# Patient Record
Sex: Female | Born: 1950
Health system: Southern US, Community
[De-identification: ages and names within clinical notes are randomized; demographics above are authoritative.]

## PROBLEM LIST (undated history)

## (undated) DIAGNOSIS — M810 Age-related osteoporosis without current pathological fracture: Secondary | ICD-10-CM

## (undated) DIAGNOSIS — IMO0002 Reserved for concepts with insufficient information to code with codable children: Secondary | ICD-10-CM

## (undated) DIAGNOSIS — I10 Essential (primary) hypertension: Secondary | ICD-10-CM

## (undated) DIAGNOSIS — M1990 Unspecified osteoarthritis, unspecified site: Secondary | ICD-10-CM

## (undated) DIAGNOSIS — I890 Lymphedema, not elsewhere classified: Secondary | ICD-10-CM

## (undated) DIAGNOSIS — Z972 Presence of dental prosthetic device (complete) (partial): Secondary | ICD-10-CM

## (undated) DIAGNOSIS — C50919 Malignant neoplasm of unspecified site of unspecified female breast: Secondary | ICD-10-CM

## (undated) DIAGNOSIS — Z86718 Personal history of other venous thrombosis and embolism: Secondary | ICD-10-CM

## (undated) DIAGNOSIS — N814 Uterovaginal prolapse, unspecified: Secondary | ICD-10-CM

## (undated) DIAGNOSIS — I447 Left bundle-branch block, unspecified: Secondary | ICD-10-CM

## (undated) DIAGNOSIS — K219 Gastro-esophageal reflux disease without esophagitis: Secondary | ICD-10-CM

## (undated) HISTORY — PX: COLONOSCOPY: SHX174

## (undated) HISTORY — DX: Malignant neoplasm of unspecified site of unspecified female breast: C50.919

## (undated) HISTORY — PX: ABDOMINAL HYSTERECTOMY: SHX81

---

## 1978-10-28 HISTORY — PX: TUBAL LIGATION: SHX77

## 1999-10-18 ENCOUNTER — Ambulatory Visit (HOSPITAL_COMMUNITY): Admission: RE | Admit: 1999-10-18 | Discharge: 1999-10-18 | Payer: Self-pay | Admitting: *Deleted

## 1999-10-18 ENCOUNTER — Encounter: Payer: Self-pay | Admitting: *Deleted

## 1999-10-29 HISTORY — PX: BREAST LUMPECTOMY: SHX2

## 1999-10-31 ENCOUNTER — Encounter: Payer: Self-pay | Admitting: *Deleted

## 1999-10-31 ENCOUNTER — Encounter (INDEPENDENT_AMBULATORY_CARE_PROVIDER_SITE_OTHER): Payer: Self-pay | Admitting: *Deleted

## 1999-10-31 ENCOUNTER — Ambulatory Visit (HOSPITAL_COMMUNITY): Admission: RE | Admit: 1999-10-31 | Discharge: 1999-10-31 | Payer: Self-pay | Admitting: *Deleted

## 1999-11-07 ENCOUNTER — Encounter: Admission: RE | Admit: 1999-11-07 | Discharge: 2000-02-05 | Payer: Self-pay | Admitting: *Deleted

## 2000-02-12 ENCOUNTER — Encounter: Admission: RE | Admit: 2000-02-12 | Discharge: 2000-05-12 | Payer: Self-pay | Admitting: *Deleted

## 2001-10-07 ENCOUNTER — Emergency Department (HOSPITAL_COMMUNITY): Admission: EM | Admit: 2001-10-07 | Discharge: 2001-10-07 | Payer: Self-pay | Admitting: Emergency Medicine

## 2004-09-08 ENCOUNTER — Ambulatory Visit: Payer: Self-pay | Admitting: Hematology & Oncology

## 2004-11-01 ENCOUNTER — Other Ambulatory Visit: Admission: RE | Admit: 2004-11-01 | Discharge: 2004-11-01 | Payer: Self-pay | Admitting: Obstetrics and Gynecology

## 2005-01-03 ENCOUNTER — Ambulatory Visit (HOSPITAL_COMMUNITY): Admission: RE | Admit: 2005-01-03 | Discharge: 2005-01-03 | Payer: Self-pay | Admitting: Gastroenterology

## 2005-01-11 ENCOUNTER — Ambulatory Visit: Payer: Self-pay | Admitting: Cardiology

## 2005-03-08 ENCOUNTER — Ambulatory Visit: Payer: Self-pay | Admitting: Hematology & Oncology

## 2005-08-30 ENCOUNTER — Ambulatory Visit: Payer: Self-pay | Admitting: Hematology & Oncology

## 2006-09-17 ENCOUNTER — Ambulatory Visit: Payer: Self-pay | Admitting: Hematology & Oncology

## 2006-09-22 LAB — COMPREHENSIVE METABOLIC PANEL
AST: 21 U/L (ref 0–37)
Albumin: 4.4 g/dL (ref 3.5–5.2)
Alkaline Phosphatase: 59 U/L (ref 39–117)
Glucose, Bld: 133 mg/dL — ABNORMAL HIGH (ref 70–99)
Potassium: 4.2 mEq/L (ref 3.5–5.3)
Sodium: 143 mEq/L (ref 135–145)
Total Protein: 6.9 g/dL (ref 6.0–8.3)

## 2006-09-22 LAB — CBC WITH DIFFERENTIAL/PLATELET
Eosinophils Absolute: 0.1 10*3/uL (ref 0.0–0.5)
MCV: 92.9 fL (ref 81.0–101.0)
MONO%: 6.8 % (ref 0.0–13.0)
NEUT#: 2.9 10*3/uL (ref 1.5–6.5)
RBC: 4.22 10*6/uL (ref 3.70–5.32)
RDW: 13.2 % (ref 11.3–14.5)
WBC: 5.8 10*3/uL (ref 3.9–10.0)
lymph#: 2.3 10*3/uL (ref 0.9–3.3)

## 2007-05-11 ENCOUNTER — Ambulatory Visit: Payer: Self-pay | Admitting: Family Medicine

## 2007-09-17 ENCOUNTER — Ambulatory Visit: Payer: Self-pay | Admitting: Hematology & Oncology

## 2007-09-21 LAB — COMPREHENSIVE METABOLIC PANEL
ALT: 15 U/L (ref 0–35)
AST: 21 U/L (ref 0–37)
Albumin: 4.3 g/dL (ref 3.5–5.2)
Alkaline Phosphatase: 64 U/L (ref 39–117)
BUN: 12 mg/dL (ref 6–23)
Potassium: 4.1 mEq/L (ref 3.5–5.3)

## 2007-09-21 LAB — CBC WITH DIFFERENTIAL/PLATELET
EOS%: 1.5 % (ref 0.0–7.0)
MCH: 31.4 pg (ref 26.0–34.0)
MCV: 91.7 fL (ref 81.0–101.0)
MONO%: 5.4 % (ref 0.0–13.0)
RBC: 4.11 10*6/uL (ref 3.70–5.32)
RDW: 13.1 % (ref 11.3–14.5)

## 2008-09-16 ENCOUNTER — Ambulatory Visit: Payer: Self-pay | Admitting: Hematology & Oncology

## 2008-09-19 LAB — COMPREHENSIVE METABOLIC PANEL
Albumin: 4.3 g/dL (ref 3.5–5.2)
Alkaline Phosphatase: 69 U/L (ref 39–117)
CO2: 24 mEq/L (ref 19–32)
Calcium: 9.9 mg/dL (ref 8.4–10.5)
Chloride: 106 mEq/L (ref 96–112)
Glucose, Bld: 93 mg/dL (ref 70–99)
Potassium: 4.8 mEq/L (ref 3.5–5.3)
Sodium: 142 mEq/L (ref 135–145)
Total Protein: 7.2 g/dL (ref 6.0–8.3)

## 2008-09-19 LAB — CBC WITH DIFFERENTIAL (CANCER CENTER ONLY)
BASO%: 0.7 % (ref 0.0–2.0)
EOS%: 1.4 % (ref 0.0–7.0)
MCH: 30.8 pg (ref 26.0–34.0)
MCHC: 33.9 g/dL (ref 32.0–36.0)
MONO%: 4.9 % (ref 0.0–13.0)
NEUT#: 3.4 10*3/uL (ref 1.5–6.5)
Platelets: 297 10*3/uL (ref 145–400)
RDW: 11.1 % (ref 10.5–14.6)

## 2009-09-08 ENCOUNTER — Ambulatory Visit: Payer: Self-pay | Admitting: Hematology & Oncology

## 2009-09-18 LAB — CBC WITH DIFFERENTIAL (CANCER CENTER ONLY)
BASO#: 0 10*3/uL (ref 0.0–0.2)
EOS%: 1.9 % (ref 0.0–7.0)
HCT: 38.1 % (ref 34.8–46.6)
HGB: 12.7 g/dL (ref 11.6–15.9)
MCH: 29.9 pg (ref 26.0–34.0)
MCHC: 33.4 g/dL (ref 32.0–36.0)
MONO%: 5.6 % (ref 0.0–13.0)
NEUT%: 62.9 % (ref 39.6–80.0)
RDW: 11.5 % (ref 10.5–14.6)

## 2009-09-18 LAB — COMPREHENSIVE METABOLIC PANEL
Alkaline Phosphatase: 71 U/L (ref 39–117)
BUN: 17 mg/dL (ref 6–23)
CO2: 23 mEq/L (ref 19–32)
Creatinine, Ser: 0.81 mg/dL (ref 0.40–1.20)
Glucose, Bld: 85 mg/dL (ref 70–99)
Total Bilirubin: 0.4 mg/dL (ref 0.3–1.2)

## 2010-09-13 ENCOUNTER — Ambulatory Visit: Payer: Self-pay | Admitting: Hematology & Oncology

## 2010-09-17 LAB — CBC WITH DIFFERENTIAL (CANCER CENTER ONLY)
BASO#: 0 10*3/uL (ref 0.0–0.2)
BASO%: 0.7 % (ref 0.0–2.0)
EOS%: 1.7 % (ref 0.0–7.0)
Eosinophils Absolute: 0.1 10*3/uL (ref 0.0–0.5)
HCT: 41.4 % (ref 34.8–46.6)
HGB: 13.6 g/dL (ref 11.6–15.9)
LYMPH#: 1.9 10*3/uL (ref 0.9–3.3)
LYMPH%: 30.4 % (ref 14.0–48.0)
MCH: 30.5 pg (ref 26.0–34.0)
MCHC: 32.9 g/dL (ref 32.0–36.0)
MCV: 92 fL (ref 81–101)
MONO#: 0.4 10*3/uL (ref 0.1–0.9)
MONO%: 6.4 % (ref 0.0–13.0)
NEUT#: 3.8 10*3/uL (ref 1.5–6.5)
NEUT%: 60.8 % (ref 39.6–80.0)
Platelets: 250 10*3/uL (ref 145–400)
RBC: 4.48 10*6/uL (ref 3.70–5.32)
RDW: 11.2 % (ref 10.5–14.6)
WBC: 6.2 10*3/uL (ref 3.9–10.0)

## 2010-09-18 LAB — COMPREHENSIVE METABOLIC PANEL
ALT: 14 U/L (ref 0–35)
AST: 23 U/L (ref 0–37)
Albumin: 4.4 g/dL (ref 3.5–5.2)
Alkaline Phosphatase: 81 U/L (ref 39–117)
BUN: 15 mg/dL (ref 6–23)
CO2: 18 mEq/L — ABNORMAL LOW (ref 19–32)
Calcium: 9.9 mg/dL (ref 8.4–10.5)
Chloride: 105 mEq/L (ref 96–112)
Creatinine, Ser: 0.76 mg/dL (ref 0.40–1.20)
Glucose, Bld: 85 mg/dL (ref 70–99)
Potassium: 4.1 mEq/L (ref 3.5–5.3)
Sodium: 142 mEq/L (ref 135–145)
Total Bilirubin: 0.5 mg/dL (ref 0.3–1.2)
Total Protein: 7.1 g/dL (ref 6.0–8.3)

## 2010-09-18 LAB — VITAMIN D 25 HYDROXY (VIT D DEFICIENCY, FRACTURES): Vit D, 25-Hydroxy: 16 ng/mL — ABNORMAL LOW (ref 30–89)

## 2011-03-15 NOTE — Op Note (Signed)
NAMEPHYILLIS, Maureen Duncan                ACCOUNT NO.:  [id number]   MEDICAL RECORD NO.:  [id number]          PATIENT TYPE:  AMB   LOCATION:  ENDO                         FACILITY:  [facility]   PHYSICIAN:  John C. Madilyn Fireman, M.D.    DATE OF BIRTH:  [birth year]   DATE OF PROCEDURE:  01/03/2005  DATE OF DISCHARGE:                                 OPERATIVE REPORT   PROCEDURE:  A 60 year old patient with history of breast cancer sent for  colon screening.   PROCEDURE:  The patient was placed in the left lateral decubitus position  and placed on the pulse monitor with continuous low flow oxygen delivered by  nasal cannula. She was sedated with 125 mcg IV Fentanyl and 10 mg IV Versed.  The Olympus video colonoscope was inserted into the rectum and advanced to  cecum, confirmed by transillumination of McBurney's point and visualization  of ileocecal valve and appendiceal orifice. Prep was excellent. The cecum,  ascending, transverse, descending and sigmoid colon all appeared normal with  no masses, polyps, diverticula or other mucosal abnormalities. The rectum  likewise appeared normal. Retroflexed view of the anus revealed no obvious  internal hemorrhoids. Scope was then withdrawn and the patient returned to  the recovery room stable. Tolerated the well and there were no immediate  complications   IMPRESSION:  Normal colonoscopy, __________ history of breast cancer.      JCH/MEDQ  D:  01/03/2005  T:  01/03/2005  Job:  [id number]   cc:   Duke Salvia. Marcelle Overlie, M.D.  [address], [address]  [place] [address]  Fax: [phone]

## 2011-04-01 ENCOUNTER — Encounter (INDEPENDENT_AMBULATORY_CARE_PROVIDER_SITE_OTHER): Payer: Self-pay | Admitting: Surgery

## 2011-08-19 ENCOUNTER — Encounter (INDEPENDENT_AMBULATORY_CARE_PROVIDER_SITE_OTHER): Payer: Self-pay

## 2011-08-19 ENCOUNTER — Encounter: Payer: Self-pay | Admitting: *Deleted

## 2011-08-21 ENCOUNTER — Encounter (INDEPENDENT_AMBULATORY_CARE_PROVIDER_SITE_OTHER): Payer: Self-pay | Admitting: Surgery

## 2011-08-21 NOTE — Progress Notes (Deleted)
NO SHOW   ASSESSMENT AND PLAN: 1.  Right breast cancer  Stage 1  Original patient of Dr. Elvina Mattes.  Right lumpectomy/SLNBx Jan 2001.  HISTORY OF PRESENT ILLNESS: No chief complaint on file.   Maureen Duncan is a 60 y.o. (DOB: 11/17/1950)  AA female who is a patient of Meriel Pica, MD and comes to me today for follow up for right breast cancer.  Path (side, TNM): Right, T1, N0 Surgery: right lumpectomy/SLNBx   Date: Jan 2001 Size of tumor: 1.5 cm Nodes: 0/1 ER: pos% PR: pos% Ki67: -%  HER2Neu: neg  Medical Oncologist: Ennever  Radiation Oncologist: Dorna Bloom    PHYSICAL EXAM: There were no vitals taken for this visit.  HEENT:  Pupils equal.  Dentition good.  No injury. NECK:  Supple.  No thyroid mass. LYMPH NODES:  No cervical, supraclavicular, or axillary adenopathy. BREASTS -  RIGHT:  No palpable mass or nodule.  No nipple discharge.   LEFT:  No palpable mass or nodule.  No nipple discharge. UPPER EXTREMITIES:  No evidence of lymphedema.

## 2011-09-09 ENCOUNTER — Ambulatory Visit: Payer: Self-pay | Admitting: Hematology & Oncology

## 2011-09-09 ENCOUNTER — Other Ambulatory Visit: Payer: Self-pay | Admitting: Lab

## 2011-09-10 ENCOUNTER — Telehealth: Payer: Self-pay | Admitting: Hematology & Oncology

## 2011-09-10 NOTE — Telephone Encounter (Signed)
Pt called made 12-20 appointment

## 2011-09-10 NOTE — Progress Notes (Signed)
This encounter was created in error - please disregard.

## 2011-10-16 ENCOUNTER — Ambulatory Visit (INDEPENDENT_AMBULATORY_CARE_PROVIDER_SITE_OTHER): Payer: Self-pay | Admitting: Surgery

## 2011-10-17 ENCOUNTER — Other Ambulatory Visit: Payer: Self-pay | Admitting: Lab

## 2011-10-17 ENCOUNTER — Ambulatory Visit: Payer: Self-pay | Admitting: Hematology & Oncology

## 2011-10-24 ENCOUNTER — Encounter (INDEPENDENT_AMBULATORY_CARE_PROVIDER_SITE_OTHER): Payer: Self-pay | Admitting: Surgery

## 2012-01-03 ENCOUNTER — Encounter (INDEPENDENT_AMBULATORY_CARE_PROVIDER_SITE_OTHER): Payer: Self-pay

## 2012-01-06 ENCOUNTER — Other Ambulatory Visit: Payer: Self-pay

## 2012-01-07 ENCOUNTER — Other Ambulatory Visit: Payer: Self-pay | Admitting: Radiology

## 2012-01-07 DIAGNOSIS — C50911 Malignant neoplasm of unspecified site of right female breast: Secondary | ICD-10-CM

## 2012-01-09 ENCOUNTER — Ambulatory Visit (INDEPENDENT_AMBULATORY_CARE_PROVIDER_SITE_OTHER): Payer: Federal, State, Local not specified - PPO | Admitting: Surgery

## 2012-01-09 ENCOUNTER — Telehealth: Payer: Self-pay | Admitting: *Deleted

## 2012-01-09 ENCOUNTER — Telehealth: Payer: Self-pay | Admitting: Hematology & Oncology

## 2012-01-09 ENCOUNTER — Encounter (INDEPENDENT_AMBULATORY_CARE_PROVIDER_SITE_OTHER): Payer: Self-pay | Admitting: Surgery

## 2012-01-09 VITALS — BP 118/76 | HR 68 | Temp 97.9°F | Resp 18 | Ht 65.0 in | Wt 159.8 lb

## 2012-01-09 DIAGNOSIS — C50919 Malignant neoplasm of unspecified site of unspecified female breast: Secondary | ICD-10-CM

## 2012-01-09 NOTE — Telephone Encounter (Signed)
Confirmed 01/23/12 genetics appt w/ pt. 

## 2012-01-09 NOTE — Telephone Encounter (Signed)
Pt called said she needed appointment with Dr. Myna Hidalgo, she has new breast cancer. I told her I would talk to the MD and call her back. Dr. Myna Hidalgo has the information and said he will let me know when to schedule her.

## 2012-01-09 NOTE — Progress Notes (Addendum)
Re:   Maureen Duncan DOB:   1951/06/12 MRN:   [id number]  ASSESSMENT AND PLAN: 1.  Right breast cancer, new.  Positive right axillary node.  For MRI on Sunday, 3/17.  Will need Right MRM.  Discussed with Dr. Myna Hidalgo.  I discussed the options for breast cancer treatment with the patient. The risks of surgery include, but are not limited to, bleeding, infection, the need for further surgery, and nerve injury.  I discussed breast reconstruction.  Since she has already had radiation, reconstruction could be considered\.  I also discussed genetics.  Will arrange consult.  [Final path - Grade 3, IDC.  ER/PR - negative. Her2Neu - 5.50 (positive).  Ki67 - 95%. MRI shows 2 masses in right breast, combined size 3.5 cm.  One lymph node lit up. She has seen Dr. Odis Luster and talked about immediate reconstruction. Will go ahead and schedule surgery.  I think the patient understands the plan.  DN  01/15/2012.]  [Talked to patient on the phone and answered a lot of questions. She met Dr. Odis Luster, but has decided against reconstruction. She sees the genetics counselor this week. Her surgery is scheduled April 4. DN 01/20/2012]  [Talked to patient by phone.  It sounds like she got excited with the genetic consultation.  She had her blood drawn for genetics last Thursday, 3/28.  This Thursday, we will still do only the right MRM and power port placement.  We will wait on the genetics test before doing anything else. DN 01/28/2012]  2.  History of right breast cancer - January 2001.  1.5 cm cancer, ER/PR positive, Her2Neu - neg.  Had lumpectomy and SLNBx - Dr. Samuella Cota - 2001.  Seeing P. Ennever and J. Wu.  3.  I discussed porta cath placement  Risks include bleeding, infection, pneumothorax, and thrombosis.  Chief Complaint  Patient presents with  . Breast Cancer   REFERRING PHYSICIAN: Meriel Pica, MD, MD  HISTORY OF PRESENT ILLNESS: Maureen Duncan is a 61 y.o. (DOB: 07/04/1951)  AA female whose  primary care physician is Meriel Pica, MD, MD and comes to me today for new right breast cancer. She has done well.  Her mother died last fall.  She noticed nothing new in her breast.  She has not other family member with breast cancer.  She went for her mammograms at [facility].  An US showed a 1.8 cm hypoechoic mass at 7 o'clock and an abnormal right axillary lymph node.  Biopsy on 01/06/2012 showed infiltration ductal ca in the breast and the axilla.    Past Medical History  Diagnosis Date  . Breast cancer 2001      Past Surgical History  Procedure Date  . Cystectomy 1996/1997  . Tubal ligation 1979/1980  . Breast lumpectomy 2000      Current Outpatient Prescriptions  Medication Sig Dispense Refill  . aspirin 81 MG tablet Take 81 mg by mouth daily.        . calcium-vitamin D (OSCAL) 250-125 MG-UNIT per tablet Take 1 tablet by mouth daily.        Marland Kitchen loratadine (CLARITIN) 10 MG tablet Take 10 mg by mouth daily.        . Multiple Vitamin (MULTIVITAMIN) tablet Take 1 tablet by mouth daily.           No Known Allergies  REVIEW OF SYSTEMS: Skin:  No history of rash.  No history of abnormal moles. Infection:  No history of hepatitis or HIV.  No  history of MRSA. Neurologic:  No history of stroke.  No history of seizure.  No history of headaches. Cardiac:  No history of hypertension. No history of heart disease.  No history of prior cardiac catheterization.  No history of seeing a cardiologist. Pulmonary:  Does not smoke cigarettes.  No asthma or bronchitis.  No OSA/CPAP.  Endocrine:  No diabetes. No thyroid disease. Gastrointestinal:  No history of stomach disease.  No history of liver disease.  No history of gall bladder disease.  No history of pancreas disease.  No history of colon disease. Urologic:  No history of kidney stones.  No history of bladder infections. Musculoskeletal:  No history of joint or back disease. Hematologic:  No bleeding disorder.  No history of anemia.   Not anticoagulated. Psycho-social:  The patient is oriented.   The patient has no obvious psychologic or social impairment to understanding our conversation and plan.  SOCIAL and FAMILY HISTORY: Single. Has two daughters- 24 and 58.  PHYSICAL EXAM: BP 118/76  Pulse 68  Temp(Src) 97.9 F (36.6 C) (Temporal)  Resp 18  Ht 5\' 5"  (1.651 m)  Wt 159 lb 12.8 oz (72.485 kg)  BMI 26.59 kg/m2  General: WN AA F who is alert and generally healthy appearing.  HEENT: Normal. Pupils equal. Good dentition. Neck: Supple. No mass.  No thyroid mass.  Carotid pulse okay with no bruit. Lymph Nodes:  No supraclavicular, cervical nodes or axillary node.  But limited because of recent biopsy. Lungs: Clear to auscultation and symmetric breath sounds. Heart:  RRR. No murmur or rub. Breasts:  Right:  Bruise at 7 o'clock in right breast, tender.  This limited the exam.      Left: Okay, nipple inverted.  Abdomen: Soft. No mass. No tenderness. No hernia. Normal bowel sounds.  No abdominal scars. Rectal: Not done. Extremities:  Good strength and ROM  in upper and lower extremities. Neurologic:  Grossly intact to motor and sensory function. Psychiatric: Has normal mood and affect. Behavior is normal.   DATA REVIEWED: Path report.  Ovidio Kin, MD,  [facility] [address] [place]  [address]   [place], [place]    [address] Phone:  [phone] FAX:  [phone]

## 2012-01-09 NOTE — Patient Instructions (Signed)
1.  We will make appointment for genetics counseling and plastic surgery.  2.  You will make an appointment for Dr. Myna Hidalgo.  3.  You have the MRI this Sunday.  4.  When all the above are complete we will schedule surgery.

## 2012-01-10 ENCOUNTER — Telehealth: Payer: Self-pay | Admitting: Hematology & Oncology

## 2012-01-10 NOTE — Telephone Encounter (Signed)
Dr. Myna Hidalgo aware pt has new onset of breast cancer he has chart and will let me know when to schedule. Misty Stanley is aware we know about patient

## 2012-01-12 ENCOUNTER — Ambulatory Visit
Admission: RE | Admit: 2012-01-12 | Discharge: 2012-01-12 | Disposition: A | Discharge status: Home / Self Care | Payer: Self-pay | Source: Ambulatory Visit | Attending: Radiology | Admitting: Radiology

## 2012-01-12 DIAGNOSIS — C50911 Malignant neoplasm of unspecified site of right female breast: Secondary | ICD-10-CM

## 2012-01-12 MED ORDER — GADOBENATE DIMEGLUMINE 529 MG/ML IV SOLN
15.0000 mL | Freq: Once | INTRAVENOUS | Status: AC | PRN
  Administered 2012-01-12: 15 mL via INTRAVENOUS

## 2012-01-13 ENCOUNTER — Other Ambulatory Visit: Payer: Self-pay

## 2012-01-17 ENCOUNTER — Encounter: Payer: Self-pay | Admitting: Hematology & Oncology

## 2012-01-20 ENCOUNTER — Encounter (HOSPITAL_COMMUNITY): Payer: Self-pay | Admitting: Pharmacy Technician

## 2012-01-20 ENCOUNTER — Encounter (INDEPENDENT_AMBULATORY_CARE_PROVIDER_SITE_OTHER): Payer: Self-pay

## 2012-01-21 ENCOUNTER — Other Ambulatory Visit (INDEPENDENT_AMBULATORY_CARE_PROVIDER_SITE_OTHER): Payer: Self-pay | Admitting: Surgery

## 2012-01-21 ENCOUNTER — Telehealth (INDEPENDENT_AMBULATORY_CARE_PROVIDER_SITE_OTHER): Payer: Self-pay | Admitting: General Surgery

## 2012-01-21 NOTE — Telephone Encounter (Signed)
Maureen Duncan at [facility] pre-admission calling for orders to be entered on Maureen Duncan.  She has appt tomorrow.

## 2012-01-21 NOTE — Pre-Procedure Instructions (Addendum)
Maureen Duncan  01/21/2012   Your procedure is scheduled on:  Thurs, April 4 @ 9:55 am  Report to [facility] at 7:45  AM.  Call this number if you have problems the morning of surgery: [phone]   Remember:   Do not eat food:After Midnight.  May have clear liquids: up to 4 Hours before arrival.(until 3:45 am)  Clear liquids include soda, tea, black coffee, apple or grape juice, broth,water  Take these medicines the morning of surgery with A SIP OF WATER:    Do not wear jewelry, make-up or nail polish.  Do not wear lotions, powders, or perfumes.   Do not shave 48 hours prior to surgery.  Do not bring valuables to the hospital.  Contacts, dentures or bridgework may not be worn into surgery.  Leave suitcase in the car. After surgery it may be brought to your room.  For patients admitted to the hospital, checkout time is 11:00 AM the day of discharge.   Patients discharged the day of surgery will not be allowed to drive home.    Special Instructions: CHG Shower Use Special Wash: 1/2 bottle night before surgery and 1/2 bottle morning of surgery.   Please read over the following fact sheets that you were given: Pain Booklet, Coughing and Deep Breathing, MRSA Information and Surgical Site Infection Prevention

## 2012-01-22 ENCOUNTER — Encounter (HOSPITAL_COMMUNITY)
Admission: RE | Admit: 2012-01-22 | Discharge: 2012-01-22 | Disposition: A | Discharge status: Home / Self Care | Payer: Federal, State, Local not specified - PPO | Source: Ambulatory Visit | Attending: Surgery | Admitting: Surgery

## 2012-01-22 ENCOUNTER — Encounter (HOSPITAL_COMMUNITY): Payer: Self-pay

## 2012-01-22 LAB — BASIC METABOLIC PANEL
BUN: 12 mg/dL (ref 6–23)
GFR calc non Af Amer: >90 mL/min (ref >90)
Glucose, Bld: 93 mg/dL (ref 70–99)
Potassium: 4.2 mEq/L (ref 3.5–5.1)

## 2012-01-22 LAB — CBC
HCT: 41.9 % (ref 36.0–46.0)
Hemoglobin: 13.9 g/dL (ref 12.0–15.0)
MCH: 31 pg (ref 26.0–34.0)
MCHC: 33.2 g/dL (ref 30.0–36.0)

## 2012-01-23 ENCOUNTER — Other Ambulatory Visit: Payer: Federal, State, Local not specified - PPO | Admitting: Lab

## 2012-01-23 ENCOUNTER — Ambulatory Visit (HOSPITAL_BASED_OUTPATIENT_CLINIC_OR_DEPARTMENT_OTHER): Payer: Federal, State, Local not specified - PPO | Admitting: Genetic Counselor

## 2012-01-23 DIAGNOSIS — C50919 Malignant neoplasm of unspecified site of unspecified female breast: Secondary | ICD-10-CM

## 2012-01-23 NOTE — Progress Notes (Signed)
Dr.  Ezzard Standing requested a consultation for genetic counseling and risk assessment for Maureen Duncan, a 61 y.o. female, for discussion of her personal history of breast cancer. She presents to clinic today to discuss the possibility of a genetic predisposition to cancer, and to further clarify her risks, as well as her family members' risks for cancer.   HISTORY OF PRESENT ILLNESS: In 2001, at the age of 22, Maureen Duncan was diagnosed with DCIS of the left breast.  In 2013, she was diagnosed with invasive carcinoma of the left breast.  She is scheduled for surgery on January 30, 2012     Past Medical History  Diagnosis Date  . Breast cancer 2000    Past Surgical History  Procedure Date  . Cystectomy 1996/1997  . Breast lumpectomy 2000  . Tubal ligation 1979/1980  . Dental surgery     History  Substance Use Topics  . Smoking status: Former Smoker    Quit date: 11/11/2011  . Smokeless tobacco: Never Used  . Alcohol Use: Yes     SOCIAL    REPRODUCTIVE HISTORY AND PERSONAL RISK ASSESSMENT FACTORS: Menarche was at age 53.   Post menopause Uterus Intact: Yes Ovaries Intact: Yes G2P2A0 , first live birth at age 26  She has not previously undergone treatment for infertility.   OCP use of approximately 1 year She has not used HRT in the past.    FAMILY HISTORY:  We obtained a detailed, 4-generation family history.  Significant diagnoses are listed below: Family History  Problem Relation Age of Onset  . Diabetes Mother   . Hypertension Mother   . Kidney failure Mother   . Other Brother     heart transplant  The patient was diagnosed with DCIS in 2001 and invasive carcinoma in 2013.  Both were in the the left breast.  The patient's mother was diagnosed with pancreatic cancer at age 58 and died at 70.  A maternal cousin was diagnosed with colon cancer in her early 16s.  There is no other reported family history of cancer on either side of the family.  Patient's maternal  ancestors are of [place] American and possibly Native American descent, and paternal ancestors are of [place] American descent. There is no reported Ashkenazi Jewish ancestry. There is no known consanguinity.  GENETIC COUNSELING RISK ASSESSMENT, DISCUSSION, AND SUGGESTED FOLLOW UP: We reviewed the natural history and genetic etiology of sporadic, familial and hereditary cancer syndromes.  Approximately 5-10% of breast cancer is the result of a hereditary cancer syndrome; of these, about 85% are due to BRCA1 or BRCA2 mutations.  We discussed the red flags of hereditary cancer syndromes and dominant inheritance.  We also discussed the option of testing for PALB2, a gene implicated in breast and pancreatic cancer.  The patient had a lot of questions about surgery options should she test positive.  We discussed the possibility of waiting to have surgery until after results are back vs. continuing with her surgery date and if she tests positive considering other screening and surgery options.  She will talk with Drs. Ennever and [facility] about these options.  The patient's recurrence of breast cancer is suggestive of the following possible diagnosis: Hereditary breast and ovarian cancer  We discussed that identification of a hereditary cancer syndrome may help her care providers tailor the patients medical management. If a mutation indicating BRCA1, BRCA2 or PALB2 is detected in this case, the [employer] recommendations would include increased cancer  surveillance and possible prophylactic surgery. If a mutation is detected, the patient will be referred back to the referring provider and to any additional appropriate care providers to discuss the relevant options.   If a mutation is not found in the patient, this will decrease the likelihood of a hereditary cancer syndrome as the explanation for her breast cancer. Cancer surveillance options would be discussed for the patient according to  the appropriate standard National Comprehensive Cancer Network and American Cancer Society guidelines, with consideration of their personal and family history risk factors. In this case, the patient will be referred back to their care providers for discussions of management.   In order to estimate her chance of having a BRCA1 or BRCA2  mutation, we used statistical models (Penn II) and laboratory data that take into account her personal medical history, family history and ancestry.  Because each model is different, there can be a lot of variability in the risks they give.  Therefore, these numbers must be considered a rough range and not a precise risk of having a BRCA1 or BRCA2 mutation.  These models estimate that she has approximately a 3-15% chance of having a BRCA1 or BRCA2 mutation, respectively. Based on this assessment of her family and personal history, genetic testing is recommended.  After considering the risks, benefits, and limitations, the patient provided informed consent for  the following  testing:  BRCAnalysis with a reflext to PALB2 through [employer].   Per the patient's request, we will contact her by telephone to discuss these results. A follow up genetic counseling visit will be scheduled if indicated.  The patient was seen for a total of 60 minutes, greater than 50% of which was spent face-to-face counseling.  This plan is being carried out per Dr. Allene Pyo recommendations.  This note will also be sent to the referring provider via the electronic medical record. The patient will be supplied with a summary of this genetic counseling discussion as well as educational information on the discussed hereditary cancer syndromes following the conclusion of their visit.   Patient was discussed with Dr. Drue Second.   EDUCATIONAL INFORMATION SUPPLIED TO PATIENT AT ENCOUNTER:  Hereditary breast and ovarian cancer syndrome  brochure _______________________________________________________________________ For Office Staff:  Number of people involved in session: 2 Was an Intern/ student involved with case: not applicable

## 2012-01-27 ENCOUNTER — Telehealth (INDEPENDENT_AMBULATORY_CARE_PROVIDER_SITE_OTHER): Payer: Self-pay | Admitting: Surgery

## 2012-01-29 MED ORDER — CHLORHEXIDINE GLUCONATE 4 % EX LIQD: 1.0000 "application " | Freq: Once | CUTANEOUS | Status: DC

## 2012-01-29 MED ORDER — HEPARIN SODIUM (PORCINE) 5000 UNIT/ML IJ SOLN
5000.0000 [IU] | Freq: Once | INTRAMUSCULAR | Status: AC
  Administered 2012-01-30: 5000 [IU] via SUBCUTANEOUS
  Filled 2012-01-29: qty 1

## 2012-01-29 MED ORDER — CEFAZOLIN SODIUM 1-5 GM-% IV SOLN
1.0000 g | INTRAVENOUS | Status: AC
  Administered 2012-01-30: 1 g via INTRAVENOUS
  Filled 2012-01-29: qty 50

## 2012-01-30 ENCOUNTER — Encounter (HOSPITAL_COMMUNITY): Payer: Self-pay | Admitting: Anesthesiology

## 2012-01-30 ENCOUNTER — Ambulatory Visit (HOSPITAL_COMMUNITY)
Admission: RE | Admit: 2012-01-30 | Discharge: 2012-01-31 | Disposition: A | Discharge status: Home / Self Care | Payer: Federal, State, Local not specified - PPO | Source: Ambulatory Visit | Attending: Surgery | Admitting: Surgery

## 2012-01-30 ENCOUNTER — Ambulatory Visit (HOSPITAL_COMMUNITY): Payer: Federal, State, Local not specified - PPO | Admitting: Anesthesiology

## 2012-01-30 ENCOUNTER — Ambulatory Visit (HOSPITAL_COMMUNITY): Payer: Federal, State, Local not specified - PPO

## 2012-01-30 ENCOUNTER — Encounter (HOSPITAL_COMMUNITY): Payer: Self-pay | Admitting: General Practice

## 2012-01-30 ENCOUNTER — Encounter (HOSPITAL_COMMUNITY): Payer: Self-pay | Admitting: *Deleted

## 2012-01-30 ENCOUNTER — Encounter (HOSPITAL_COMMUNITY)
Admission: RE | Disposition: A | Discharge status: Home / Self Care | Payer: Self-pay | Source: Ambulatory Visit | Attending: Surgery

## 2012-01-30 DIAGNOSIS — C773 Secondary and unspecified malignant neoplasm of axilla and upper limb lymph nodes: Secondary | ICD-10-CM | POA: Insufficient documentation

## 2012-01-30 DIAGNOSIS — C50919 Malignant neoplasm of unspecified site of unspecified female breast: Secondary | ICD-10-CM

## 2012-01-30 DIAGNOSIS — Z01812 Encounter for preprocedural laboratory examination: Secondary | ICD-10-CM | POA: Insufficient documentation

## 2012-01-30 HISTORY — PX: PORTACATH PLACEMENT: SHX2246

## 2012-01-30 HISTORY — PX: MODIFIED RADICAL MASTECTOMY W/ AXILLARY LYMPH NODE DISSECTION: SHX2042

## 2012-01-30 SURGERY — MASTECTOMY, MODIFIED RADICAL
Anesthesia: General | Site: Chest | Laterality: Right | Wound class: Clean

## 2012-01-30 MED ORDER — HEPARIN SOD (PORK) LOCK FLUSH 100 UNIT/ML IV SOLN
INTRAVENOUS | Status: DC | PRN
  Administered 2012-01-30: 400 [IU] via INTRAVENOUS

## 2012-01-30 MED ORDER — MORPHINE SULFATE 2 MG/ML IJ SOLN
1.0000 mg | INTRAMUSCULAR | Status: DC | PRN
  Administered 2012-01-30 – 2012-01-31 (×4): 2 mg via INTRAVENOUS
  Filled 2012-01-30 (×4): qty 1

## 2012-01-30 MED ORDER — FENTANYL CITRATE 0.05 MG/ML IJ SOLN
INTRAMUSCULAR | Status: DC | PRN
  Administered 2012-01-30: 50 ug via INTRAVENOUS
  Administered 2012-01-30: 100 ug via INTRAVENOUS
  Administered 2012-01-30: 50 ug via INTRAVENOUS
  Administered 2012-01-30: 100 ug via INTRAVENOUS
  Administered 2012-01-30: 50 ug via INTRAVENOUS
  Administered 2012-01-30: 100 ug via INTRAVENOUS

## 2012-01-30 MED ORDER — MIDAZOLAM HCL 5 MG/5ML IJ SOLN
INTRAMUSCULAR | Status: DC | PRN
  Administered 2012-01-30: 2 mg via INTRAVENOUS

## 2012-01-30 MED ORDER — HEPARIN SODIUM (PORCINE) 5000 UNIT/ML IJ SOLN
5000.0000 [IU] | Freq: Three times a day (TID) | INTRAMUSCULAR | Status: DC
  Administered 2012-01-30 – 2012-01-31 (×3): 5000 [IU] via SUBCUTANEOUS
  Filled 2012-01-30 (×6): qty 1

## 2012-01-30 MED ORDER — HYDROMORPHONE HCL PF 1 MG/ML IJ SOLN
0.2500 mg | INTRAMUSCULAR | Status: DC | PRN
  Administered 2012-01-30 (×4): 0.5 mg via INTRAVENOUS

## 2012-01-30 MED ORDER — 0.9 % SODIUM CHLORIDE (POUR BTL) OPTIME
TOPICAL | Status: DC | PRN
  Administered 2012-01-30: 2000 mL

## 2012-01-30 MED ORDER — ONDANSETRON HCL 4 MG/2ML IJ SOLN: 4.0000 mg | Freq: Four times a day (QID) | INTRAMUSCULAR | Status: DC | PRN

## 2012-01-30 MED ORDER — LIDOCAINE HCL (CARDIAC) 20 MG/ML IV SOLN
INTRAVENOUS | Status: DC | PRN
  Administered 2012-01-30: 80 mg via INTRAVENOUS

## 2012-01-30 MED ORDER — KCL IN DEXTROSE-NACL 20-5-0.45 MEQ/L-%-% IV SOLN
INTRAVENOUS | Status: DC
  Administered 2012-01-30 – 2012-01-31 (×2): via INTRAVENOUS
  Filled 2012-01-30 (×4): qty 1000

## 2012-01-30 MED ORDER — ONDANSETRON HCL 4 MG PO TABS: 4.0000 mg | ORAL_TABLET | Freq: Four times a day (QID) | ORAL | Status: DC | PRN

## 2012-01-30 MED ORDER — HYDROCODONE-ACETAMINOPHEN 5-325 MG PO TABS
1.0000 | ORAL_TABLET | ORAL | Status: DC | PRN
  Administered 2012-01-31: 1 via ORAL
  Filled 2012-01-30: qty 1

## 2012-01-30 MED ORDER — ARTIFICIAL TEARS OP OINT
TOPICAL_OINTMENT | OPHTHALMIC | Status: DC | PRN
  Administered 2012-01-30: 1 via OPHTHALMIC

## 2012-01-30 MED ORDER — MEPERIDINE HCL 25 MG/ML IJ SOLN: 6.2500 mg | INTRAMUSCULAR | Status: DC | PRN

## 2012-01-30 MED ORDER — PROPOFOL 10 MG/ML IV EMUL
INTRAVENOUS | Status: DC | PRN
  Administered 2012-01-30: 20 mg via INTRAVENOUS
  Administered 2012-01-30: 30 mg via INTRAVENOUS
  Administered 2012-01-30: 150 mg via INTRAVENOUS

## 2012-01-30 MED ORDER — SODIUM CHLORIDE 0.9 % IR SOLN
Status: DC | PRN
  Administered 2012-01-30: 10:00:00

## 2012-01-30 MED ORDER — PROMETHAZINE HCL 25 MG/ML IJ SOLN: 6.2500 mg | INTRAMUSCULAR | Status: DC | PRN

## 2012-01-30 MED ORDER — LACTATED RINGERS IV SOLN
INTRAVENOUS | Status: DC | PRN
  Administered 2012-01-30 (×2): via INTRAVENOUS

## 2012-01-30 MED ORDER — ONDANSETRON HCL 4 MG/2ML IJ SOLN
INTRAMUSCULAR | Status: DC | PRN
  Administered 2012-01-30: 4 mg via INTRAVENOUS

## 2012-01-30 SURGICAL SUPPLY — 79 items
APL SKNCLS STERI-STRIP NONHPOA (GAUZE/BANDAGES/DRESSINGS) ×2
APPLIER CLIP 9.375 MED OPEN (MISCELLANEOUS) ×3
APR CLP MED 9.3 20 MLT OPN (MISCELLANEOUS) ×2
ATCH SMKEVC FLXB CAUT HNDSWH (FILTER) ×4 IMPLANT
BAG DECANTER FOR FLEXI CONT (MISCELLANEOUS) ×3 IMPLANT
BANDAGE ELASTIC 6 VELCRO ST LF (GAUZE/BANDAGES/DRESSINGS) ×2 IMPLANT
BENZOIN TINCTURE PRP APPL 2/3 (GAUZE/BANDAGES/DRESSINGS) ×3 IMPLANT
BINDER BREAST LRG (GAUZE/BANDAGES/DRESSINGS) IMPLANT
BINDER BREAST XLRG (GAUZE/BANDAGES/DRESSINGS) IMPLANT
BLADE SURG 15 STRL LF DISP TIS (BLADE) ×2 IMPLANT
BLADE SURG 15 STRL SS (BLADE) ×3
CANISTER SUCTION 2500CC (MISCELLANEOUS) ×3 IMPLANT
CHLORAPREP W/TINT 10.5 ML (MISCELLANEOUS) ×3 IMPLANT
CLIP APPLIE 9.375 MED OPEN (MISCELLANEOUS) ×2 IMPLANT
CLOTH BEACON ORANGE TIMEOUT ST (SAFETY) ×3 IMPLANT
CONT SPEC 4OZ CLIKSEAL STRL BL (MISCELLANEOUS) IMPLANT
COVER PROBE W GEL 5X96 (DRAPES) ×3 IMPLANT
COVER SURGICAL LIGHT HANDLE (MISCELLANEOUS) ×4 IMPLANT
CRADLE DONUT ADULT HEAD (MISCELLANEOUS) ×3 IMPLANT
DECANTER SPIKE VIAL GLASS SM (MISCELLANEOUS) ×3 IMPLANT
DRAIN CHANNEL 19F RND (DRAIN) ×4 IMPLANT
DRAPE C-ARM 42X72 X-RAY (DRAPES) ×3 IMPLANT
DRAPE LAPAROSCOPIC ABDOMINAL (DRAPES) ×3 IMPLANT
DRAPE PROXIMA HALF (DRAPES) ×2 IMPLANT
DRAPE UTILITY 15X26 W/TAPE STR (DRAPE) ×8 IMPLANT
DRSG PAD ABDOMINAL 8X10 ST (GAUZE/BANDAGES/DRESSINGS) ×2 IMPLANT
ELECT CAUTERY BLADE 6.4 (BLADE) ×3 IMPLANT
ELECT REM PT RETURN 9FT ADLT (ELECTROSURGICAL) ×3
ELECTRODE REM PT RTRN 9FT ADLT (ELECTROSURGICAL) ×2 IMPLANT
EVACUATOR SILICONE 100CC (DRAIN) ×3 IMPLANT
EVACUATOR SMOKE ACCUVAC VALLEY (FILTER) ×2
GAUZE SPONGE 4X4 16PLY XRAY LF (GAUZE/BANDAGES/DRESSINGS) ×3 IMPLANT
GLOVE BIO SURGEON STRL SZ7.5 (GLOVE) ×2 IMPLANT
GLOVE BIOGEL PI IND STRL 7.0 (GLOVE) IMPLANT
GLOVE BIOGEL PI IND STRL 7.5 (GLOVE) IMPLANT
GLOVE BIOGEL PI INDICATOR 7.0 (GLOVE) ×1
GLOVE BIOGEL PI INDICATOR 7.5 (GLOVE) ×2
GLOVE SURG SIGNA 7.5 PF LTX (GLOVE) ×3 IMPLANT
GOWN STRL NON-REIN LRG LVL3 (GOWN DISPOSABLE) ×5 IMPLANT
GOWN STRL REIN XL XLG (GOWN DISPOSABLE) ×4 IMPLANT
INTRODUCER 13FR (MISCELLANEOUS) IMPLANT
KIT BASIN OR (CUSTOM PROCEDURE TRAY) ×4 IMPLANT
KIT PORT POWER 9.6FR MRI PREA (Catheter) IMPLANT
KIT PORT POWER ISP 8FR (Catheter) IMPLANT
KIT POWER CATH 8FR (Catheter) IMPLANT
KIT ROOM TURNOVER OR (KITS) ×3 IMPLANT
NDL 18GX1X1/2 (RX/OR ONLY) (NEEDLE) ×2 IMPLANT
NDL HYPO 25GX1X1/2 BEV (NEEDLE) ×2 IMPLANT
NEEDLE 18GX1X1/2 (RX/OR ONLY) (NEEDLE) IMPLANT
NEEDLE HYPO 25GX1X1/2 BEV (NEEDLE) ×3 IMPLANT
NS IRRIG 1000ML POUR BTL (IV SOLUTION) ×4 IMPLANT
PACK GENERAL/GYN (CUSTOM PROCEDURE TRAY) ×3 IMPLANT
PACK SURGICAL SETUP 50X90 (CUSTOM PROCEDURE TRAY) ×3 IMPLANT
PAD ARMBOARD 7.5X6 YLW CONV (MISCELLANEOUS) ×6 IMPLANT
PENCIL BUTTON HOLSTER BLD 10FT (ELECTRODE) ×3 IMPLANT
PREFILTER EVAC NS 1 1/3-3/8IN (MISCELLANEOUS) ×3 IMPLANT
SET SHEATH INTRODUCER 10FR (MISCELLANEOUS) IMPLANT
SHEATH COOK PEEL AWAY SET 9F (SHEATH) IMPLANT
SPECIMEN JAR X LARGE (MISCELLANEOUS) ×2 IMPLANT
SPONGE GAUZE 4X4 12PLY (GAUZE/BANDAGES/DRESSINGS) ×3 IMPLANT
SPONGE LAP 18X18 X RAY DECT (DISPOSABLE) IMPLANT
STAPLER VISISTAT 35W (STAPLE) ×3 IMPLANT
STRIP CLOSURE SKIN 1/2X4 (GAUZE/BANDAGES/DRESSINGS) ×1 IMPLANT
STRIP CLOSURE SKIN 1/4X4 (GAUZE/BANDAGES/DRESSINGS) ×2 IMPLANT
SUT ETHILON 2 0 FS 18 (SUTURE) ×4 IMPLANT
SUT MON AB 5-0 PS2 18 (SUTURE) ×1 IMPLANT
SUT SILK 3 0 (SUTURE) ×3
SUT SILK 3-0 18XBRD TIE 12 (SUTURE) ×2 IMPLANT
SUT VIC AB 3-0 SH 18 (SUTURE) ×3 IMPLANT
SUT VIC AB 5-0 PS2 18 (SUTURE) ×3 IMPLANT
SYR 20ML ECCENTRIC (SYRINGE) ×6 IMPLANT
SYR 5ML LUER SLIP (SYRINGE) ×3 IMPLANT
SYR BULB 3OZ (MISCELLANEOUS) ×3 IMPLANT
SYR CONTROL 10ML LL (SYRINGE) ×3 IMPLANT
TAPE CLOTH SURG 6X10 WHT LF (GAUZE/BANDAGES/DRESSINGS) ×2 IMPLANT
TOWEL OR 17X24 6PK STRL BLUE (TOWEL DISPOSABLE) ×3 IMPLANT
TOWEL OR 17X26 10 PK STRL BLUE (TOWEL DISPOSABLE) ×3 IMPLANT
TUBE CONNECTING 12X1/4 (SUCTIONS) ×3 IMPLANT
WATER STERILE IRR 1000ML POUR (IV SOLUTION) IMPLANT

## 2012-01-30 NOTE — OR Nursing (Signed)
1st procedure end at 1215. Patient repositioned and redraped, 2nd procedure start at 1224.

## 2012-01-30 NOTE — H&P (View-Only) (Signed)
Re:   Maureen Duncan DOB:   [birth year] MRN:   [id number]  ASSESSMENT AND PLAN: 1.  Right breast cancer, new.  Positive right axillary node.  For MRI on Sunday, 3/17.  Will need Right MRM.  Discussed with Dr. Myna Hidalgo.  I discussed the options for breast cancer treatment with the patient. The risks of surgery include, but are not limited to, bleeding, infection, the need for further surgery, and nerve injury.  I discussed breast reconstruction.  Since she has already had radiation, reconstruction could be considered\.  I also discussed genetics.  Will arrange consult.  [Final path - Grade 3, IDC.  ER/PR - negative. Her2Neu - 5.50 (positive).  Ki67 - 95%. MRI shows 2 masses in right breast, combined size 3.5 cm.  One lymph node lit up. She has seen Dr. Odis Luster and talked about immediate reconstruction. Will go ahead and schedule surgery.  I think the patient understands the plan.  DN  01/15/2012.]  [Talked to patient on the phone and answered a lot of questions. She met Dr. Odis Luster, but has decided against reconstruction. She sees the genetics counselor this week. Her surgery is scheduled April 4. DN 01/20/2012]  [Talked to patient by phone.  It sounds like she got excited with the genetic consultation.  She had her blood drawn for genetics last Thursday, 3/28.  This Thursday, we will still do only the right MRM and power port placement.  We will wait on the genetics test before doing anything else. DN 01/28/2012]  2.  History of right breast cancer - January 2001.  1.5 cm cancer, ER/PR positive, Her2Neu - neg.  Had lumpectomy and SLNBx - Dr. Samuella Cota - 2001.  Seeing P. Ennever and J. Wu.  3.  I discussed porta cath placement  Risks include bleeding, infection, pneumothorax, and thrombosis.  Chief Complaint  Patient presents with  . Breast Cancer   REFERRING PHYSICIAN: Meriel Pica, MD, MD  HISTORY OF PRESENT ILLNESS: Maureen Duncan is a 61 y.o. (DOB: 10/05/1951)  AA female whose  primary care physician is Meriel Pica, MD, MD and comes to me today for new right breast cancer. She has done well.  Her mother died last fall.  She noticed nothing new in her breast.  She has not other family member with breast cancer.  She went for her mammograms at [facility].  An US showed a 1.8 cm hypoechoic mass at 7 o'clock and an abnormal right axillary lymph node.  Biopsy on 01/06/2012 showed infiltration ductal ca in the breast and the axilla.    Past Medical History  Diagnosis Date  . Breast cancer 2001      Past Surgical History  Procedure Date  . Cystectomy 1996/1997  . Tubal ligation 1979/1980  . Breast lumpectomy 2000      Current Outpatient Prescriptions  Medication Sig Dispense Refill  . aspirin 81 MG tablet Take 81 mg by mouth daily.        . calcium-vitamin D (OSCAL) 250-125 MG-UNIT per tablet Take 1 tablet by mouth daily.        Marland Kitchen loratadine (CLARITIN) 10 MG tablet Take 10 mg by mouth daily.        . Multiple Vitamin (MULTIVITAMIN) tablet Take 1 tablet by mouth daily.           No Known Allergies  REVIEW OF SYSTEMS: Skin:  No history of rash.  No history of abnormal moles. Infection:  No history of hepatitis or HIV.  No  history of MRSA. Neurologic:  No history of stroke.  No history of seizure.  No history of headaches. Cardiac:  No history of hypertension. No history of heart disease.  No history of prior cardiac catheterization.  No history of seeing a cardiologist. Pulmonary:  Does not smoke cigarettes.  No asthma or bronchitis.  No OSA/CPAP.  Endocrine:  No diabetes. No thyroid disease. Gastrointestinal:  No history of stomach disease.  No history of liver disease.  No history of gall bladder disease.  No history of pancreas disease.  No history of colon disease. Urologic:  No history of kidney stones.  No history of bladder infections. Musculoskeletal:  No history of joint or back disease. Hematologic:  No bleeding disorder.  No history of anemia.   Not anticoagulated. Psycho-social:  The patient is oriented.   The patient has no obvious psychologic or social impairment to understanding our conversation and plan.  SOCIAL and FAMILY HISTORY: Single. Has two daughters- 41 and 2.  PHYSICAL EXAM: BP 118/76  Pulse 68  Temp(Src) 97.9 F (36.6 C) (Temporal)  Resp 18  Ht 5\' 5"  (1.651 m)  Wt 159 lb 12.8 oz (72.485 kg)  BMI 26.59 kg/m2  General: WN AA F who is alert and generally healthy appearing.  HEENT: Normal. Pupils equal. Good dentition. Neck: Supple. No mass.  No thyroid mass.  Carotid pulse okay with no bruit. Lymph Nodes:  No supraclavicular, cervical nodes or axillary node.  But limited because of recent biopsy. Lungs: Clear to auscultation and symmetric breath sounds. Heart:  RRR. No murmur or rub. Breasts:  Right:  Bruise at 7 o'clock in right breast, tender.  This limited the exam.      Left: Okay, nipple inverted.  Abdomen: Soft. No mass. No tenderness. No hernia. Normal bowel sounds.  No abdominal scars. Rectal: Not done. Extremities:  Good strength and ROM  in upper and lower extremities. Neurologic:  Grossly intact to motor and sensory function. Psychiatric: Has normal mood and affect. Behavior is normal.   DATA REVIEWED: Path report.  Ovidio Kin, MD,  [facility] [address] [place]  [address]   [place], [place]    [address] Phone:  [phone] FAX:  [phone]

## 2012-01-30 NOTE — Interval H&P Note (Signed)
History and Physical Interval Note:  01/30/2012 9:59 AM  Maureen Duncan  has presented today for surgery, with the diagnosis of right breast cancer   The various methods of treatment have been discussed with the patient and family. The patient's daughter is in the waiting area.  After consideration of risks, benefits and other options for treatment, the patient has consented to  Procedure(s) (LRB): MASTECTOMY MODIFIED RADICAL (Right) INSERTION PORT-A-CATH (N/A) as a surgical intervention .    The patients' history has been reviewed, patient examined, no change in status, stable for surgery.  I have reviewed the patients' chart and labs.  Questions were answered to the patient's satisfaction.     Maureen Duncan

## 2012-01-30 NOTE — Brief Op Note (Signed)
01/30/2012  12:52 PM  PATIENT:  Maureen Duncan, 61 y.o., female, MRN: [id number]  PREOP DIAGNOSIS:  right breast cancer   POSTOP DIAGNOSIS:   Right breast cancer, positive axillary lymph node (T2, N1), anticipate chemotherapy.  PROCEDURE:   Procedure(s): Right MASTECTOMY MODIFIED RADICAL,  INSERTION PORT-A-CATH  SURGEON:   Ovidio Kin, M.D.  ASSISTANT:   None  ANESTHESIA:   general  Josepha Pigg, MD - Anesthesiologist De Nurse, CRNA - CRNA Fransisca Kaufmann, CRNA - CRNA Kipp Brood, MD - Anesthesiologist De Nurse, CRNA - CRNA  General  EBL:  100  ml  BLOOD ADMINISTERED: none  DRAINS: 2 19  LOCAL MEDICATIONS USED:   none  SPECIMEN:   Right breast (long suture marks axillary contents), inferior skin flap (long suture - superior, short suture - lateral)  COUNTS CORRECT:  YES  INDICATIONS FOR PROCEDURE:  Maureen Duncan is a 61 y.o. (DOB: 08/09/1951) AA female whose primary care physician is Meriel Pica, MD, MD and comes for right MRM and power port placement.   The indications and risks of the surgery were explained to the patient.  The risks include, but are not limited to, infection, bleeding, and nerve injury.  Note dictated to:   [phone]

## 2012-01-30 NOTE — Preoperative (Signed)
Beta Blockers   Reason not to administer Beta Blockers:Not Applicable 

## 2012-01-30 NOTE — Progress Notes (Signed)
NOS  Sore but doing well. Dressing dry.  Ovidio Kin, MD, [facility] Pager: [phone] Office phone:  [phone]

## 2012-01-30 NOTE — Anesthesia Postprocedure Evaluation (Signed)
  Anesthesia Post-op Note  Patient: Maureen Duncan  Procedure(s) Performed: Procedure(s) (LRB): MASTECTOMY MODIFIED RADICAL (Right) INSERTION PORT-A-CATH (Left)  Patient Location: PACU  Anesthesia Type: General  Level of Consciousness: awake, alert  and oriented  Airway and Oxygen Therapy: Patient Spontanous Breathing and Patient connected to nasal cannula oxygen  Post-op Pain: mild  Post-op Assessment: Post-op Vital signs reviewed and Patient's Cardiovascular Status Stable  Post-op Vital Signs: stable  Complications: No apparent anesthesia complications

## 2012-01-30 NOTE — Transfer of Care (Signed)
Immediate Anesthesia Transfer of Care Note  Patient: Maureen Duncan  Procedure(s) Performed: Procedure(s) (LRB): MASTECTOMY MODIFIED RADICAL (Right) INSERTION PORT-A-CATH (Left)  Patient Location: PACU  Anesthesia Type: General  Level of Consciousness: awake, alert  and oriented  Airway & Oxygen Therapy: Patient Spontanous Breathing and Patient connected to nasal cannula oxygen  Post-op Assessment: Report given to PACU RN  Post vital signs: Reviewed and stable  Complications: No apparent anesthesia complications

## 2012-01-30 NOTE — Anesthesia Preprocedure Evaluation (Signed)
Anesthesia Evaluation  Patient identified by MRN, date of birth, ID band Patient awake    Reviewed: Allergy & Precautions, H&P , NPO status , Patient's Chart, lab work & pertinent test results  History of Anesthesia Complications Negative for: history of anesthetic complications  Airway Mallampati: I  Neck ROM: Full    Dental  (+) Teeth Intact and Dental Advisory Given   Pulmonary neg pulmonary ROS,  breath sounds clear to auscultation        Cardiovascular negative cardio ROS  Rhythm:Regular Rate:Normal     Neuro/Psych    GI/Hepatic negative GI ROS, Neg liver ROS,   Endo/Other  negative endocrine ROS  Renal/GU negative Renal ROS     Musculoskeletal   Abdominal   Peds  Hematology negative hematology ROS (+)   Anesthesia Other Findings   Reproductive/Obstetrics                           Anesthesia Physical Anesthesia Plan  ASA: I  Anesthesia Plan: General   Post-op Pain Management:    Induction: Intravenous  Airway Management Planned: LMA  Additional Equipment:   Intra-op Plan:   Post-operative Plan: Extubation in OR  Informed Consent: I have reviewed the patients History and Physical, chart, labs and discussed the procedure including the risks, benefits and alternatives for the proposed anesthesia with the patient or authorized representative who has indicated his/her understanding and acceptance.   Dental advisory given  Plan Discussed with: CRNA and Surgeon  Anesthesia Plan Comments:         Anesthesia Quick Evaluation

## 2012-01-30 NOTE — Op Note (Signed)
Maureen Duncan, HACKLEMAN                ACCOUNT NO.:  [id number]  MEDICAL RECORD NO.:  [id number]  LOCATION:  5155                         FACILITY:  [facility]  PHYSICIAN:  Sandria Bales. Ezzard Standing, M.D.  DATE OF BIRTH:  10/15/1951  DATE OF PROCEDURE:  01/30/2012                              OPERATIVE REPORT   PREOPERATIVE DIAGNOSIS:  Right breast cancer with positive axillary lymph node.  POSTOPERATIVE DIAGNOSIS:  Right breast cancer with positive axillary lymph node, (T2, N1) anticipate chemotherapy.  PROCEDURE:  Right modified radical mastectomy, excision of inferior skin flap, insertion of PowerPort.  SURGEON:  Sandria Bales. Ezzard Standing, MD  ASSISTANT:  There is no first assistant.  ANESTHESIA:  General LMA.  COMPLICATIONS:  None.  ESTIMATED BLOOD LOSS:  100 mL.  DRAINS:  Left in were 2 Blake, two 19-French Blake drains.  INDICATION FOR PROCEDURE:  Ms. Maureen Duncan is a 61 year old African American female, who is a patient of Dr. Richarda Overlie.  She had a right breast cancer diagnosed in January 2001, treated by Drs. Pollyann Savoy, and Dr. Jackelyn Knife.  Unfortunately, she has a new right breast cancer diagnosed which by MRI the largest deminsion is 3.5 cm, and she had a lymph node which was biopsied and is positive for cancer cells.  Her tumor is HER-2/neu positive.  She has seen Dr. Arlan Organ.  The plan is to proceed with a right mastectomy with right axillary lymph node dissection and placement of a PowerPort for anticipated chemotherapy.    She had seen a Dentist, but I do not have results back on that yet so we are just going to do her right breast at this time.  She has seen Dr. Delories Heinz for consideration of reconstruction, but did elect to not pursue this at this time.  Indications and potential complications of breast surgery and Port-A- Cath placed were reviewed with the patient.  Potential complications include, but are not limited to, bleeding, infection,  pneumothorax, deep venous thrombosis, lymphedema of the right arm, and recurrent breast cancer.  OPERATIVE NOTE:  The patient was in room 17.  Dr. Sharee Holster, is supervising anesthesiologist.  She underwent general anesthesia.  I did this as a 2-step procedure.  I first did her mastectomy with her arms out and then after I completed the mastectomy, I tucked her arms and did the Port-A-Cath.  A time out was held and the surgical check list reviewed.  The first portion of the operation with her arms at her right chest and axilla were prepped with ChloraPrep and sterilely draped.  An elliptical incision including the nipple at the axilla was completed.  Her prior biopsy was really high on the axilla, so I really could not include all of the old scar in my incision.  I developed flaps superiorly to about 2 fingers below the clavicle, medially to the lateral edge of the sternum, inferiorly to the investing fascia of the rectus abdominis muscle, and laterally to latissimus dorsi muscle.  I then elevated the breast off of the pectoralis major muscle.  I got to the axilla.  She has a 4 cm scar in her axilla/lateral breast from her prior  lumpectomy.  I identified the axillary vein.  I swept all the axillary contents inferior to this.  I did identify both the long thoracic nerve of Bell and the thoracodorsal nerve.  I removed level 2 lymph nodes getting behind the pectoralis minor muscle.  She had at least 2 or 3 hard nodes that I could feel in the specimen.  After completion of the axillary dissection, I marked the lateral aspect of the aspect of the specimen with a suture.  I irrigated the axilla of the chest with saline which I used about 3 liters, I brought out two 19-French drains through inferior flaps, sewed these in place with 2-0 nylon suture.  Because of some redundant skin, I did resect some of the inferior flap of skin.    I then closed the skin in 2 layers with 3-0 Vicryl  subcutaneous layer and then a 4-0 Monocryl subcuticular layer and a tincture of benzoin and Steri-Strips through the skin.  The patient was then repositioned with her arms tucked.  Her upper chest and neck prepped with ChloraPrep.  I then accessed the left subclavian vein with the 16-gauge needle, introduced a guidewire into this and checked position with fluoroscopy.  I then developed a pocket in the upper aspect of the left breast for the PowerPort.  This was sewn in place with 3-0 Vicryl suture.  Silastic tubing passed from the reservoir site to the subclavian access site and introduced subclavian vein with the 8-French introducer.  This position was checked with fluoroscopy.  I tried to position the tip of the silastic tube at the junction of the superior vena cava and right atrium.  I then attached the tubing to the reservoir with the Bayonet device.  I flushed the entire device first with dilute heparin 10 units per mL, then 4 mL of concentrated heparin 100 units per mL.  I checked the position of the PowerPort and  the tubing with fluoroscopy.  I then closed the subcutaneous skin with 3-0 Vicryl suture, 5-0 Vicryl subcuticular suture, painted wounds with tincture of benzoin and Steri- Strips.  She tolerated the procedure well, was transported to the recovery room in good condition.  Sponge and needle count were correct at the end of the case.  She will be kept overnight.  Chest x-ray is pending at the time of this dictation.   Sandria Bales. Ezzard Standing, M.D., FACS   DHN/MEDQ  D:  01/30/2012  T:  01/30/2012  Job:  [id number]  cc:   Duke Salvia. Marcelle Overlie, M.D. Josph Macho, M.D.

## 2012-01-30 NOTE — Anesthesia Procedure Notes (Signed)
Procedure Name: LMA Insertion Date/Time: 01/30/2012 10:12 AM Performed by: Jefm Miles E Pre-anesthesia Checklist: Patient identified, Timeout performed, Emergency Drugs available, Suction available and Patient being monitored Patient Re-evaluated:Patient Re-evaluated prior to inductionOxygen Delivery Method: Circle system utilized Preoxygenation: Pre-oxygenation with 100% oxygen Intubation Type: IV induction Ventilation: Mask ventilation without difficulty LMA: LMA inserted LMA Size: 4.0 Number of attempts: 1 Placement Confirmation: breath sounds checked- equal and bilateral and positive ETCO2 Tube secured with: Tape Dental Injury: Teeth and Oropharynx as per pre-operative assessment

## 2012-01-31 ENCOUNTER — Encounter (HOSPITAL_COMMUNITY): Payer: Self-pay | Admitting: Surgery

## 2012-01-31 MED ORDER — HYDROCODONE-ACETAMINOPHEN 5-325 MG PO TABS: 1.0000 | ORAL_TABLET | ORAL | Status: DC | PRN

## 2012-01-31 MED FILL — Vancomycin HCl-Dextrose IV Soln 1 GM/200ML-5%: INTRAVENOUS | Qty: 200 | Status: CN

## 2012-01-31 NOTE — Discharge Instructions (Signed)
[facility] - DISCHARGE INSTRUCTIONS TO PATIENT  Return to work on:  2 weeks.  Activity:  Driving - may drive in 2 to 3 days, if doing well.   Lifting - Use your right arm as you see fit.  No lifting > 15 pounds for one week.  Wound Care:   May remove bandage and shower in 2 to 3 days.  You may also leave the bandage on till you see Dr. Ezzard Standing next week.  Diet:  As tolerated  Follow up appointment:  Call Dr. Allene Pyo office [facility]) at [phone] for an appointment in 5 to 7 days.  Medications and dosages:  Resume your home medications.  You have a prescription for:  Vicodin.  Call Dr. Ezzard Standing or his office  [phone]) if you have:  Temperature greater than 100.4,  Persistent nausea and vomiting,  Severe uncontrolled pain,  Redness, tenderness, or signs of infection (pain, swelling, redness, odor or green/yellow discharge around the site),  Difficulty breathing, headache or visual disturbances,  Any other questions or concerns you may have after discharge.  In an emergency, call 911 or go to an Emergency Department at a nearby hospital.

## 2012-01-31 NOTE — Progress Notes (Signed)
Patient discharged to home as ordered, instructions given and verbalized understanding, escorted by daughter, made comfortable

## 2012-01-31 NOTE — Progress Notes (Signed)
General Surgery Note  LOS: 1 day  POD# 1 Room - 5155  Assessment/Plan:  1.  Right MASTECTOMY MODIFIED RADICAL, INSERTION PORT-A-CATH - D. Shereena Berquist - 01/30/2012  For T2, N1 right breast cancer (2nd right breast cancer).  Dr. Myna Hidalgo treating oncologist.  Looks good today, ready to go home.  D/C instructions reviewed --- Summary # [id number]   Subjective:  Doing better than last night. Objective:   Filed Vitals:   01/31/12 0545  BP: 133/70  Pulse: 72  Temp: 98.3 F (36.8 C)  Resp: 18     Intake/Output from previous day:  04/04 0701 - 04/05 0700 In: 2657 [I.V.:2657] Out: 780 [Urine:600; Drains:130; Blood:50]  Intake/Output this shift:      Physical Exam:   General: WN AA F who is alert and oriented.    HEENT: Normal. Pupils equal. Good dentition. .   Lungs: Clear.   Breast Wound: 2 drains okay.  No much drainage.  In binder.   Neurologic:  Grossly intact to motor and sensory function.   Psychiatric: Has normal mood and affect. Behavior is normal   Lab Results:   No results found for this basename: WBC:2,HGB:2,HCT:2,PLT:2 in the last 72 hours  BMET  No results found for this basename: NA:2,K:2,CL:2,CO2:2,GLUCOSE:2,BUN:2,CREATININE:2,CALCIUM:2 in the last 72 hours  PT/INR  No results found for this basename: LABPROT:2,INR:2 in the last 72 hours  ABG  No results found for this basename: PHART:2,PCO2:2,PO2:2,HCO3:2 in the last 72 hours   Studies/Results:  Dg Chest Port 1 View  01/30/2012  *RADIOLOGY REPORT*  Clinical Data: Status post power port placement  PORTABLE CHEST - 1 VIEW  Comparison: None.  Findings: A Port-A-Cath is in place via a left subclavian approach and the tip is located at the superior cavoatrial junction, 5 cm below the level of the carina.  Heart and mediastinal contours are within normal limits.  The lung fields appear clear with no signs of focal infiltrate or congestive failure.  The costophrenic angles are excluded precluding evaluation for pleural fluid.   The patient is status post lymph node dissection of the right axilla and surgical drains are in place overlying the right hemithorax.  IMPRESSION: Power port placement as above.  This report was called to the PACU to [facility] as requested.  Original Report Authenticated By: Bertha Stakes, M.D.     Anti-infectives:   Anti-infectives     Start     Dose/Rate Route Frequency Ordered Stop   01/29/12 1324   ceFAZolin (ANCEF) IVPB 1 g/50 mL premix        1 g 100 mL/hr over 30 Minutes Intravenous 60 min pre-op 01/29/12 1324 01/30/12 1003          Ovidio Kin, MD, FACS Pager: [phone],   [facility] [place] [facility] Office: [phone] 01/31/2012

## 2012-02-01 NOTE — Discharge Summary (Signed)
NAMEINDIYA, Maureen Duncan                ACCOUNT NO.:  [id number]  MEDICAL RECORD NO.:  [id number]  LOCATION:  5155                         FACILITY:  [facility]  PHYSICIAN:  Sandria Bales. Ezzard Standing, M.D.  DATE OF BIRTH:  [birth year]  DATE OF ADMISSION:  01/30/2012 DATE OF DISCHARGE:  01/31/2012                              DISCHARGE SUMMARY  DISCHARGE DIAGNOSIS: 1. Right breast cancer, T2, N1, followed by Dr. Arlan Organ. 2. Need of IV access.  OPERATION PERFORMED:  She had a right modified radical mastectomy and a left subclavian Power Port placement on January 30, 2012 by Dr. Ovidio Kin.  HISTORY OF PRESENT ILLNESS:  Maureen Duncan is a pleasant 61 year old African American female who had a right breast cancer treated by Dr. Rozetta Nunnery in January, 2001.  She did well until recently, she was found to have an abnormality of her right breast.  She underwent a biopsy of her right breast and a right axillary lymph node, and both proved to be invasive ductal carcinoma.  It is a ER and PR negative HER 2/neu positive, Ki67 at 95%.  She has seen Dr. Etter Sjogren for consideration of reconstruction, but decided against it right now.  She has been seen by Genetics, with test pending. She comes to this hospitalization for a right mastectomy and axillary node dissection, and placement of Power port for anticipated chemotherapy.  PAST MEDICAL HISTORY:  She has no significant medical illnesses.  HOSPITAL COURSE:  On the day of admission, she went to the operating room where she underwent a right modified radical mastectomy and placement of left subclavian Power port.  Her postop chest x-ray showed good position of the Power port.  She is now 1 day postop, doing well and ready for discharge.  DISCHARGE INSTRUCTIONS: She has 2 drains that she will empty at home.   She will see me back within 1 week for a wound check and drain management.   I will give her some Vicodin for pain.   She should not drive for  2-3 days.  She can shower at home, but she may leave the bandage on until she sees me next week.   We will review her pathology when I see her back next week.  Her discharge condition is good with final pathology pending.   Sandria Bales. Ezzard Standing, M.D., FACS   DHN/MEDQ  D:  01/31/2012  T:  02/01/2012  Job:  [id number]  cc:   Duke Salvia. Marcelle Overlie, M.D. Fax: [phone]  Josph Macho, M.D. Fax: [phone]  Etter Sjogren, M.D. Fax: [phone]

## 2012-02-03 ENCOUNTER — Telehealth (INDEPENDENT_AMBULATORY_CARE_PROVIDER_SITE_OTHER): Payer: Self-pay | Admitting: General Surgery

## 2012-02-03 NOTE — Telephone Encounter (Signed)
Pt calling to report rash all over, itchy. She is taking Vicodin for pain control after Rt mastectomy.  Paged and updated Dr. Ezzard Standing; received orders for Ultram, #20, 1 po q6h prn pain, NO refill and called it to [facility] [address].  Pt advised to pick up Benadryl at the pharmacy when she goes for the prescription.

## 2012-02-04 ENCOUNTER — Telehealth (INDEPENDENT_AMBULATORY_CARE_PROVIDER_SITE_OTHER): Payer: Self-pay | Admitting: Surgery

## 2012-02-05 ENCOUNTER — Other Ambulatory Visit: Payer: Self-pay | Admitting: Hematology & Oncology

## 2012-02-05 DIAGNOSIS — C50919 Malignant neoplasm of unspecified site of unspecified female breast: Secondary | ICD-10-CM

## 2012-02-06 ENCOUNTER — Ambulatory Visit (INDEPENDENT_AMBULATORY_CARE_PROVIDER_SITE_OTHER): Payer: Federal, State, Local not specified - PPO | Admitting: Surgery

## 2012-02-06 ENCOUNTER — Encounter (INDEPENDENT_AMBULATORY_CARE_PROVIDER_SITE_OTHER): Payer: Self-pay | Admitting: Surgery

## 2012-02-06 VITALS — BP 128/82 | HR 72 | Temp 97.6°F | Resp 16 | Ht 65.0 in | Wt 160.6 lb

## 2012-02-06 DIAGNOSIS — C50919 Malignant neoplasm of unspecified site of unspecified female breast: Secondary | ICD-10-CM

## 2012-02-06 NOTE — Progress Notes (Addendum)
   Re:   Maureen Duncan DOB:   [birth year] MRN:   [id number]  ASSESSMENT AND PLAN: 1.  Right breast cancer, T2, N1.  Right MRM - 01/30/2012.  Final path - 3.8 cm ca, 3/13 nodes positive, extracapsular tumor extension.  Final path - Grade 3, IDC.  ER/PR - negative. Her2Neu - 5.50 (positive).  Ki67 - 95%.  MRI shows 2 masses in right breast, combined size 3.5 cm.  One lymph node lit up.  She has seen Dr. Odis Duncan and talked about immediate reconstruction, but decided against it.  Genetic tests are pending.  Sees Dr. Ross Duncan.  She'll see me back in one week.  2.  History of right breast cancer - January 2001.  1.5 cm cancer, ER/PR positive, Her2Neu - neg.  Had lumpectomy and SLNBx - Dr. Samuella Duncan - 2001.  Seeing Maureen Duncan and Maureen Duncan.  3.  Power port placed 01/30/2012. Maureen Duncan.  Genetic testing - I can't speak to all the details, but she decided to cancel genetic testing.  ?money/reimbursement issues?  DN 02/12/2012]   Chief Complaint  Patient presents with  . Routine Post Op    1st PO Rt maste 01/30/12   REFERRING PHYSICIAN: Meriel Pica, MD, MD  HISTORY OF PRESENT ILLNESS: Maureen Duncan is a 61 y.o. (DOB: 03/17/1951)  AA female whose primary care physician is Maureen Pica, MD, MD and comes  For follow up of right mastectomy.  Her daughter, Maureen Duncan, is with her.  The medial drain is less that 10 cc per day.  I removed that drain.  The lateral (axillary) drain is 30 - 40 cc / day.  I left that drain.  I gave her a copy of her path report.      Past Medical History  Diagnosis Date  . Breast cancer 2000; 01/30/12    right      Current Outpatient Prescriptions  Medication Sig Dispense Refill  . diphenhydrAMINE (BENADRYL) 25 MG tablet Take 25 mg by mouth every 6 (six) hours as needed.      . traMADol (ULTRAM) 50 MG tablet Ad lib.      Marland Kitchen DISCONTD: calcium-vitamin D (OSCAL) 250-125 MG-UNIT per tablet Take 1 tablet by mouth daily.        Marland Kitchen DISCONTD: loratadine (CLARITIN) 10 MG tablet  Take 10 mg by mouth daily.           No Known Allergies  REVIEW OF SYSTEMS: Negative except the breast cancer.  SOCIAL and FAMILY HISTORY: Single. Has two daughters- 38 and 44.  PHYSICAL EXAM: BP 128/82  Pulse 72  Temp(Src) 97.6 F (36.4 C) (Temporal)  Resp 16  Ht 5\' 5"  (1.651 m)  Wt 160 lb 9.6 oz (72.848 kg)  BMI 26.73 kg/m2  General: WN AA F who is alert and generally healthy appearing.   Right breast absent.  Wound looks good.  Both drains look okay.  I removed the medial one. Left subclavian power port okay.  DATA REVIEWED: Path report to patient.  Maureen Kin, MD,  [facility] [address] [place]  [address]   [place], [place]    [address] Phone:  [phone] FAX:  [phone]

## 2012-02-11 ENCOUNTER — Telehealth (INDEPENDENT_AMBULATORY_CARE_PROVIDER_SITE_OTHER): Payer: Self-pay | Admitting: General Surgery

## 2012-02-11 ENCOUNTER — Telehealth: Payer: Self-pay | Admitting: Genetic Counselor

## 2012-02-11 NOTE — Telephone Encounter (Signed)
Pt calling because genetic testing apparently not done, secondary to meeting deductable, etc.  She would like to speak with Huntley Dec.

## 2012-02-11 NOTE — Telephone Encounter (Signed)
I received an email from [employer].  They had called Ms. Burpee to let her know they had reverified her insurance and that her out of pocket cost for the test will be $606 for the BRACAnalysis and $215.25 for the PALB2.  She was upset with them for not having start the test and hung up on them.  I called Ms. Skalicky to let her know the above information.  She was very angry b/c she feels that she has met the deductible, and does not understand why they don't just send in the bill to her insurance and do the test.  I tried to explain that they can do that, but their concern is that she not get a large bill without her knowledge.  Before I was able to complete my sentence she said "wait...just wait..I know that you all just want to be paid but this is information I need for myself.  Just cancel this test.  You don't need to say anything more, just cancel this test." And she hung up.

## 2012-02-11 NOTE — Telephone Encounter (Signed)
I called the patient back and she said she decided to cancel the genetic testing process.  They told her from the cancer center that her insurance would not cover it until her deductible was met and that she owed so much out of pocket.  She did not want to go through with that and wants to focus on getting well.

## 2012-02-13 ENCOUNTER — Ambulatory Visit (INDEPENDENT_AMBULATORY_CARE_PROVIDER_SITE_OTHER): Payer: Federal, State, Local not specified - PPO | Admitting: General Surgery

## 2012-02-13 ENCOUNTER — Encounter (INDEPENDENT_AMBULATORY_CARE_PROVIDER_SITE_OTHER): Payer: Self-pay | Admitting: General Surgery

## 2012-02-13 ENCOUNTER — Ambulatory Visit: Payer: Federal, State, Local not specified - PPO | Admitting: Hematology & Oncology

## 2012-02-13 VITALS — BP 128/76 | HR 72 | Temp 97.1°F | Resp 16 | Ht 65.0 in | Wt 155.2 lb

## 2012-02-13 DIAGNOSIS — Z09 Encounter for follow-up examination after completed treatment for conditions other than malignant neoplasm: Secondary | ICD-10-CM

## 2012-02-13 NOTE — Progress Notes (Signed)
Subjective:     Patient ID: Maureen Duncan, female   DOB: 03/21/1951, 61 y.o.   MRN: [id number]  HPI 53 yof s/p right mrm with one drain out.  Returns today without complaints, Remaining drain putting out less than 10 cc of serous fluid now.    Review of Systems     Objective:   Physical Exam Wound healing, there is some eschar medially, no infection, flaps viable    Assessment:     S/p right mastectomy    Plan:     I removed drain. She will need to be seen in one week to make sure incision healing. Gave her exercises to begin doing also She asked about prosthetic and I told her she would need to heal a little bit more first

## 2012-02-14 ENCOUNTER — Encounter: Payer: Self-pay | Admitting: Hematology & Oncology

## 2012-02-14 ENCOUNTER — Other Ambulatory Visit: Payer: Self-pay | Admitting: Hematology & Oncology

## 2012-02-14 ENCOUNTER — Encounter: Payer: Self-pay | Admitting: Genetic Counselor

## 2012-02-20 ENCOUNTER — Encounter (INDEPENDENT_AMBULATORY_CARE_PROVIDER_SITE_OTHER): Payer: Self-pay | Admitting: Surgery

## 2012-02-20 ENCOUNTER — Ambulatory Visit (INDEPENDENT_AMBULATORY_CARE_PROVIDER_SITE_OTHER): Payer: Federal, State, Local not specified - PPO | Admitting: Surgery

## 2012-02-20 VITALS — BP 120/70 | HR 95 | Temp 97.6°F | Ht 65.0 in | Wt 160.2 lb

## 2012-02-20 DIAGNOSIS — C50919 Malignant neoplasm of unspecified site of unspecified female breast: Secondary | ICD-10-CM

## 2012-02-20 NOTE — Progress Notes (Addendum)
   Re:   Maureen Duncan DOB:   07-Jul-1951 MRN:   [id number]  ASSESSMENT AND PLAN: 1.  Right breast cancer, T2, N1.  Right MRM - 01/30/2012.  Final path - 3.8 cm ca, 3/13 nodes positive, extracapsular tumor extension.  Final path - Grade 3, IDC.  ER/PR - negative. Her2Neu - 5.50 (positive).  Ki67 - 95%.  She has an area in the medial of the right mastectomy incision where she may slough some skin.  I will see her back in 2 weeks to check the incision.  She is to see Dr. Myna Hidalgo on 02/26/2012.  I think she can go ahead and start treatment.  2.  History of right breast cancer - January 2001.  1.5 cm cancer, ER/PR positive, Her2Neu - neg.  Had lumpectomy and SLNBx - Dr. Samuella Cota - 2001.  Saw P. Ennever and J. Wu.  3.  Power port placed 01/30/2012. 4.  Genetic testing - I can't speak to all the details, but she decided to cancel genetic testing. (See letter in files)  REFERRING PHYSICIAN: Meriel Pica, MD, MD  HISTORY OF PRESENT ILLNESS: Maureen Duncan is a 61 y.o. (DOB: 10-19-1951)  AA female whose primary care physician is Meriel Pica, MD, MD and comes  For follow up of right mastectomy.  She looks good.  Dr. Dwain Sarna removed the second drain last week.  She does have some skin changes in the medial aspect of her mastectomy.  She has no complaint.   Past Medical History  Diagnosis Date  . Breast cancer 2000; 01/30/12    right   Current Outpatient Prescriptions  Medication Sig Dispense Refill  . diphenhydrAMINE (BENADRYL) 25 MG tablet Take 25 mg by mouth every 6 (six) hours as needed.      . traMADol (ULTRAM) 50 MG tablet Ad lib.      Marland Kitchen DISCONTD: calcium-vitamin D (OSCAL) 250-125 MG-UNIT per tablet Take 1 tablet by mouth daily.        Marland Kitchen DISCONTD: loratadine (CLARITIN) 10 MG tablet Take 10 mg by mouth daily.          No Known Allergies  REVIEW OF SYSTEMS: Negative except the breast cancer.  SOCIAL and FAMILY HISTORY: Single. Has two daughters- 16 and 51.  PHYSICAL  EXAM: BP 120/70  Pulse 95  Temp(Src) 97.6 F (36.4 C) (Temporal)  Ht 5\' 5"  (1.651 m)  Wt 160 lb 3.2 oz (72.666 kg)  BMI 26.66 kg/m2  SpO2 98%  General: WN AA F who is alert and generally healthy appearing.   Right breast absent.  Medial aspect of the wound is beat up.  Will recheck wound in 2 weeks. Left subclavian power port okay.  DATA REVIEWED: No new data.  Ovidio Kin, MD,  [facility] [address] [place]  [address]   [place], [place]    [address] Phone:  [phone] FAX:  [phone]

## 2012-02-26 ENCOUNTER — Ambulatory Visit (HOSPITAL_BASED_OUTPATIENT_CLINIC_OR_DEPARTMENT_OTHER): Payer: Federal, State, Local not specified - PPO | Admitting: Hematology & Oncology

## 2012-02-26 VITALS — BP 123/88 | HR 67 | Temp 97.5°F | Ht 65.0 in | Wt 157.0 lb

## 2012-02-26 DIAGNOSIS — Z171 Estrogen receptor negative status [ER-]: Secondary | ICD-10-CM

## 2012-02-26 DIAGNOSIS — C50919 Malignant neoplasm of unspecified site of unspecified female breast: Secondary | ICD-10-CM

## 2012-02-26 DIAGNOSIS — C50519 Malignant neoplasm of lower-outer quadrant of unspecified female breast: Secondary | ICD-10-CM

## 2012-02-26 DIAGNOSIS — C773 Secondary and unspecified malignant neoplasm of axilla and upper limb lymph nodes: Secondary | ICD-10-CM

## 2012-02-26 NOTE — Progress Notes (Signed)
This office note has been dictated.

## 2012-02-27 ENCOUNTER — Telehealth: Payer: Self-pay | Admitting: Hematology & Oncology

## 2012-02-27 NOTE — Progress Notes (Signed)
CC:   Sandria Bales. Ezzard Standing, M.D. Richard M. Marcelle Overlie, M.D.  DIAGNOSIS:  Stage  IIB (T2 N1 N0) ductal carcinoma of the right breast, ER/PR NEGATIVE and HER-2 POSITIVE.  HISTORY OF PRESENT ILLNESS:  Maureen Duncan is a very nice 61 year old African American female.  She initially had a stage I ductal carcinoma of the right breast back in 2000.  She underwent chemotherapy with 4 cycles of epirubicin and Cytoxan, followed by radiation.  Her tumor was ER positive and received tamoxifen.  She has been doing well.  She went in for a mammogram.  The mammogram surprising enough showed a suspicious mass in the right breast.  This was at the 7 o'clock position.  She underwent an ultrasound.  This confirmed a 1.2 cm mass at the 7 o'clock position.  There is also an abnormal right axillary lymph node.  She did undergo a biopsy.  This was done on 03/11.  The pathology report ([id number])  showed an invasive ductal carcinoma.  This was a grade 3. A lymph node biopsy was done, which also showed an invasive ductal carcinoma.  Prognostic markers were done which showed the tumor to be ER NEGATIVE, PR NEGATIVE and HER-2 POSITIVE.  She had a markedly elevated Ki-67 index of 96%.  She then underwent a breast MRI.  This was done on the 17th.  The breast MRI showed a 2.5 x 3 x 3.5 cm mass in the outer mid and lower right breast.  No other abnormalities were noted in the left breast.  She did have a chest x-ray done after a port was placed. The chest x-ray looked relatively clear.  Dr. Ezzard Standing then went ahead and took her to surgery.  He did a right modified radical mastectomy.  The pathology report ([id number]) showed an invasive, high-grade ductal carcinoma. This measured 3.8 cm.  There was lymphovascular space invasion.  There was high-grade ductal carcinoma in situ.  Thirteen lymph nodes were removed, 3 were positive.  There was some extracapsular tumor extension noted.  As such, she is a stage IIB (T2 N1  N0) infiltrating ductal carcinoma of the right breast.  This clearly is a 2nd breast cancer for her.  Again, she feels well.  She has had no fatigue or weakness.  She got through her surgery okay.  She has a Port-A-Cath placed.  We are asked now to see her again for consideration of adjuvant chemotherapy.  PAST MEDICAL HISTORY:  Really unremarkable.  ALLERGIES:  None.  MEDICATIONS:  Ultram 50 mg q.6 hours p.r.n. and Benadryl as needed.  SOCIAL HISTORY:  Negative for any tobacco or alcohol use.  REVIEW OF SYSTEMS:  As stated in history of present illness.  PHYSICAL EXAMINATION:  This is a well-developed, well-nourished black female in no obvious distress.  Vital signs:  Temperature of 97.5, pulse 67, respiratory 20, blood pressure 123/88.  Weight is 157.  Head and neck:  Normocephalic, atraumatic skull.  There are no ocular or oral lesions.  There are no palpable cervical or supraclavicular lymph nodes. Lungs:  Clear bilaterally.  No rales, wheeze or rhonchi.  Cardiac: Regular rhythm with a normal S1 and S2.  There are no murmurs, rubs or bruits.  Abdomen:  Soft with good bowel sounds.  There is no palpable abdominal mass.  There is no fluid wave.  There is no palpable hepatosplenomegaly.  Breasts:  Left breast,  no masses, edema or erythema.  There is no left axillary adenopathy.  Right chest wall shows a  well-healing mastectomy.  No erythema or swelling is noted on the right anterior chest wall.  There are no right axillary lymph nodes. Back:  Some tenderness about the medial portion of the right scapula. There is no frank tenderness over the spine, ribs, or hips. Extremities:  No clubbing, no cyanosis or edema.  She has good range motion of her joints.  Neurologic:  No focal neurological deficits. Skin:  No rashes, ecchymosis or petechia.  LABORATORY STUDIES:  Not done this visit as she unfortunately came in late.  IMPRESSION:  [name] is a 61 year old postmenopausal  African American female with a 2nd breast cancer.  Her 1st breast cancer was ER positive, also on the right breast.  She did receive chemotherapy followed by radiation and Arimidex.  This is a totally separate breast cancer.  This is clearly much more aggressive.  She has ominous prognostic markers.  She has stage IIB disease from what I tell by the pathological report.  She clearly would benefit from adjuvant chemotherapy.  I would favor using Taxotere/carboplatin/Herceptin.  She will get 6 cycles of chemotherapy followed by an additional 4 years of Herceptin.  We will go ahead and get an additional staging studies done.  Will get her set up with a CT of the chest, abdomen and pelvis.  Will also get a bone scan on her.  I did give her any information about the chemotherapy protocol.  She will need an echocardiogram so that we can assess left ventricular ejection fraction.  She did receive epirubicin back 13 years ago. However, I do not think this would be detrimental to her cardiac function currently or with Herceptin.  We will get her set up to start treatment on May 10th.  Again, I will give her 6 cycles of TCH.  She then received a 4 year total of Herceptin every 3 weeks.  I spent a good hour and a half or more with Ms. Reigel and her daughter. I answered all their questions.    ______________________________ Josph Macho, M.D. PRE/MEDQ  D:  02/26/2012  T:  02/27/2012  Job:  [id number]

## 2012-02-27 NOTE — Telephone Encounter (Signed)
Left pt message to call for all appointments

## 2012-02-27 NOTE — Telephone Encounter (Signed)
Pt aware of scans and chemo appointments for may 6 to 6-1. She is aware of npo and to drink contrast

## 2012-03-02 ENCOUNTER — Other Ambulatory Visit: Payer: Self-pay | Admitting: Hematology & Oncology

## 2012-03-02 ENCOUNTER — Other Ambulatory Visit (HOSPITAL_COMMUNITY): Payer: Federal, State, Local not specified - PPO

## 2012-03-02 ENCOUNTER — Ambulatory Visit (HOSPITAL_COMMUNITY)
Admission: RE | Admit: 2012-03-02 | Discharge: 2012-03-02 | Disposition: A | Discharge status: Home / Self Care | Payer: Federal, State, Local not specified - PPO | Source: Ambulatory Visit | Attending: Hematology & Oncology | Admitting: Hematology & Oncology

## 2012-03-02 DIAGNOSIS — I2510 Atherosclerotic heart disease of native coronary artery without angina pectoris: Secondary | ICD-10-CM | POA: Insufficient documentation

## 2012-03-02 DIAGNOSIS — C50919 Malignant neoplasm of unspecified site of unspecified female breast: Secondary | ICD-10-CM

## 2012-03-02 DIAGNOSIS — K7689 Other specified diseases of liver: Secondary | ICD-10-CM | POA: Insufficient documentation

## 2012-03-02 MED ORDER — IOHEXOL 300 MG/ML  SOLN
100.0000 mL | Freq: Once | INTRAMUSCULAR | Status: AC | PRN
  Administered 2012-03-02: 100 mL via INTRAVENOUS

## 2012-03-04 ENCOUNTER — Telehealth: Payer: Self-pay | Admitting: *Deleted

## 2012-03-04 ENCOUNTER — Ambulatory Visit (INDEPENDENT_AMBULATORY_CARE_PROVIDER_SITE_OTHER): Payer: Federal, State, Local not specified - PPO | Admitting: Surgery

## 2012-03-04 ENCOUNTER — Encounter (INDEPENDENT_AMBULATORY_CARE_PROVIDER_SITE_OTHER): Payer: Self-pay | Admitting: Surgery

## 2012-03-04 VITALS — BP 106/78 | HR 74 | Temp 98.6°F | Resp 18 | Ht 65.0 in | Wt 159.4 lb

## 2012-03-04 DIAGNOSIS — C50919 Malignant neoplasm of unspecified site of unspecified female breast: Secondary | ICD-10-CM

## 2012-03-04 NOTE — Progress Notes (Signed)
   Re:   Maureen Duncan DOB:   Sep 22, 1951 MRN:   [id number]  ASSESSMENT AND PLAN: 1.  Right breast cancer, T2, N1.  Right MRM - 01/30/2012.  Final path - 3.8 cm ca, 3/13 nodes positive, extracapsular tumor extension.  Final path - Grade 3, IDC.  ER/PR - negative. Her2Neu - 5.50 (positive).  Ki67 - 95%.  Doing well from surgery.  To start chemotx 03/06/2012.  Will see me back in 6 months.  2.  History of right breast cancer - January 2001.  1.5 cm cancer, ER/PR positive, Her2Neu - neg.  Had lumpectomy and SLNBx - Dr. Samuella Cota - 2001.  Saw P. Ennever and J. Wu.  3.  Power port placed 01/30/2012. 4.  Genetic testing - I can't speak to all the details, but she decided to cancel genetic testing. (See letter in files)  REFERRING PHYSICIAN: Meriel Pica, MD, MD  HISTORY OF PRESENT ILLNESS: Maureen Duncan is a 61 y.o. (DOB: [birth year])  AA female whose primary care physician is Meriel Pica, MD, MD and comes for follow up of right mastectomy.  She looks good.  The wound looks better than last time.  She is getting pre op testing and is to start her chemotx this Friday.  She is in good spirits.   Past Medical History  Diagnosis Date  . Breast cancer 2000; 01/30/12    right   Current Outpatient Prescriptions  Medication Sig Dispense Refill  . diphenhydrAMINE (BENADRYL) 25 MG tablet Take 25 mg by mouth every 6 (six) hours as needed.      . traMADol (ULTRAM) 50 MG tablet Ad lib.      Marland Kitchen DISCONTD: calcium-vitamin D (OSCAL) 250-125 MG-UNIT per tablet Take 1 tablet by mouth daily.        Marland Kitchen DISCONTD: loratadine (CLARITIN) 10 MG tablet Take 10 mg by mouth daily.          No Known Allergies  REVIEW OF SYSTEMS: Negative except the breast cancer.  SOCIAL and FAMILY HISTORY: Single. Has two daughters- 57 and 27.  PHYSICAL EXAM: BP 106/78  Pulse 74  Temp(Src) 98.6 F (37 C) (Temporal)  Resp 18  Ht 5\' 5"  (1.651 m)  Wt 159 lb 6.4 oz (72.303 kg)  BMI 26.53 kg/m2  General: WN AA F who  is alert and generally healthy appearing.   Right breast absent.  Has a 1 x 2.5 cm scab on the medial part of the mastectomy incision that looks okay and should do well.  She skin is stuck to the chest wall, but should loosen up with time. Left subclavian power port okay.  DATA REVIEWED: Reviewed Dr. Gustavo Lah note.  Ovidio Kin, MD,  [facility] [address] [place]  [address]   [place], [place]    [address] Phone:  [phone] FAX:  [phone]

## 2012-03-04 NOTE — Telephone Encounter (Signed)
Called patient to let her know that  Her CT scan showed no obvious mets per dr. Myna Hidalgo. Patient ecstatic

## 2012-03-04 NOTE — Telephone Encounter (Signed)
Message copied by Anselm Jungling on Wed Mar 04, 2012  2:39 PM ------      Message from: Arlan Organ R      Created: Mon Mar 02, 2012  9:36 PM       Call - no obvious mets!!  Maureen Duncan

## 2012-03-05 ENCOUNTER — Encounter (HOSPITAL_COMMUNITY): Payer: Self-pay

## 2012-03-05 ENCOUNTER — Encounter (HOSPITAL_COMMUNITY)
Admission: RE | Admit: 2012-03-05 | Discharge: 2012-03-05 | Disposition: A | Discharge status: Home / Self Care | Payer: Federal, State, Local not specified - PPO | Source: Ambulatory Visit | Attending: Hematology & Oncology | Admitting: Hematology & Oncology

## 2012-03-05 DIAGNOSIS — C50919 Malignant neoplasm of unspecified site of unspecified female breast: Secondary | ICD-10-CM | POA: Insufficient documentation

## 2012-03-05 DIAGNOSIS — Z9010 Acquired absence of unspecified breast and nipple: Secondary | ICD-10-CM | POA: Insufficient documentation

## 2012-03-05 MED ORDER — TECHNETIUM TC 99M MEDRONATE IV KIT
24.0000 | PACK | Freq: Once | INTRAVENOUS | Status: AC | PRN
  Administered 2012-03-05: 24 via INTRAVENOUS

## 2012-03-06 ENCOUNTER — Other Ambulatory Visit (HOSPITAL_COMMUNITY): Payer: Federal, State, Local not specified - PPO

## 2012-03-06 ENCOUNTER — Ambulatory Visit (HOSPITAL_BASED_OUTPATIENT_CLINIC_OR_DEPARTMENT_OTHER): Payer: Federal, State, Local not specified - PPO

## 2012-03-06 ENCOUNTER — Other Ambulatory Visit: Payer: Federal, State, Local not specified - PPO | Admitting: Lab

## 2012-03-06 ENCOUNTER — Ambulatory Visit (HOSPITAL_COMMUNITY): Payer: Federal, State, Local not specified - PPO

## 2012-03-06 ENCOUNTER — Ambulatory Visit: Payer: Federal, State, Local not specified - PPO

## 2012-03-06 ENCOUNTER — Other Ambulatory Visit (HOSPITAL_BASED_OUTPATIENT_CLINIC_OR_DEPARTMENT_OTHER): Payer: Federal, State, Local not specified - PPO | Admitting: Lab

## 2012-03-06 VITALS — BP 111/69 | HR 67 | Temp 97.6°F | Ht 65.0 in | Wt 161.0 lb

## 2012-03-06 DIAGNOSIS — C50919 Malignant neoplasm of unspecified site of unspecified female breast: Secondary | ICD-10-CM

## 2012-03-06 DIAGNOSIS — Z5112 Encounter for antineoplastic immunotherapy: Secondary | ICD-10-CM

## 2012-03-06 DIAGNOSIS — Z5111 Encounter for antineoplastic chemotherapy: Secondary | ICD-10-CM

## 2012-03-06 DIAGNOSIS — C50519 Malignant neoplasm of lower-outer quadrant of unspecified female breast: Secondary | ICD-10-CM

## 2012-03-06 DIAGNOSIS — C50419 Malignant neoplasm of upper-outer quadrant of unspecified female breast: Secondary | ICD-10-CM

## 2012-03-06 LAB — CMP (CANCER CENTER ONLY)
ALT(SGPT): 10 U/L (ref 10–47)
CO2: 27 mEq/L (ref 18–33)
Chloride: 104 mEq/L (ref 98–108)
Potassium: 4.2 mEq/L (ref 3.3–4.7)
Sodium: 140 mEq/L (ref 128–145)
Total Bilirubin: 0.6 mg/dl (ref 0.20–1.60)
Total Protein: 7.6 g/dL (ref 6.4–8.1)

## 2012-03-06 LAB — CBC WITH DIFFERENTIAL (CANCER CENTER ONLY)
BASO%: 0.4 % (ref 0.0–2.0)
LYMPH#: 1.7 10*3/uL (ref 0.9–3.3)
LYMPH%: 31.4 % (ref 14.0–48.0)
MONO#: 0.5 10*3/uL (ref 0.1–0.9)
NEUT#: 3.1 10*3/uL (ref 1.5–6.5)
Platelets: 247 10*3/uL (ref 145–400)
RDW: 13.4 % (ref 11.1–15.7)
WBC: 5.5 10*3/uL (ref 3.9–10.0)

## 2012-03-06 MED ORDER — SODIUM CHLORIDE 0.9 % IV SOLN
Freq: Once | INTRAVENOUS | Status: AC
  Administered 2012-03-06: 12:00:00 via INTRAVENOUS

## 2012-03-06 MED ORDER — DEXAMETHASONE SODIUM PHOSPHATE 4 MG/ML IJ SOLN
20.0000 mg | Freq: Once | INTRAMUSCULAR | Status: AC
  Administered 2012-03-06: 20 mg via INTRAVENOUS

## 2012-03-06 MED ORDER — HEPARIN SOD (PORK) LOCK FLUSH 100 UNIT/ML IV SOLN
500.0000 [IU] | Freq: Once | INTRAVENOUS | Status: DC | PRN
  Filled 2012-03-06: qty 5

## 2012-03-06 MED ORDER — SODIUM CHLORIDE 0.9 % IV SOLN
680.0000 mg | Freq: Once | INTRAVENOUS | Status: AC
  Administered 2012-03-06: 680 mg via INTRAVENOUS
  Filled 2012-03-06: qty 68

## 2012-03-06 MED ORDER — DOCETAXEL CHEMO INJECTION 160 MG/16ML
75.0000 mg/m2 | Freq: Once | INTRAVENOUS | Status: AC
  Administered 2012-03-06: 140 mg via INTRAVENOUS
  Filled 2012-03-06: qty 14

## 2012-03-06 MED ORDER — TRASTUZUMAB CHEMO INJECTION 440 MG
6.0000 mg/kg | Freq: Once | INTRAVENOUS | Status: AC
  Administered 2012-03-06: 420 mg via INTRAVENOUS
  Filled 2012-03-06: qty 20

## 2012-03-06 MED ORDER — ACETAMINOPHEN 325 MG PO TABS
650.0000 mg | ORAL_TABLET | Freq: Once | ORAL | Status: AC
  Administered 2012-03-06: 650 mg via ORAL

## 2012-03-06 MED ORDER — SODIUM CHLORIDE 0.9 % IJ SOLN
10.0000 mL | INTRAMUSCULAR | Status: DC | PRN
  Filled 2012-03-06: qty 10

## 2012-03-06 MED ORDER — ONDANSETRON 16 MG/50ML IVPB (CHCC)
16.0000 mg | Freq: Once | INTRAVENOUS | Status: AC
  Administered 2012-03-06: 16 mg via INTRAVENOUS

## 2012-03-06 MED ORDER — DIPHENHYDRAMINE HCL 25 MG PO CAPS
50.0000 mg | ORAL_CAPSULE | Freq: Once | ORAL | Status: AC
  Administered 2012-03-06: 50 mg via ORAL

## 2012-03-06 NOTE — Patient Instructions (Addendum)
[facility] Discharge Instructions for Patients Receiving Chemotherapy  Today you received the following chemotherapy agents Carboplatin, Taxotere, Herceptin  To help prevent nausea and vomiting after your treatment, we encourage you to take your nausea medication:  Zofran 8mg: 1 tab twice daily for 4 days starting the day after chemo then twice daily as needed   Decadron 4mg: 2 tabs twice daily for 4 days starting the day after chemo  Ativan 1mg: 1 tab every 8 hours as needed for nausea, vomiting, sleep, or anxiety (may also dissolve under tongue).  If you develop nausea and vomiting that is not controlled by your nausea medication, call the clinic. If it is after clinic hours your family physician or the after hours number for the clinic or go to the Emergency Department.   BELOW ARE SYMPTOMS THAT SHOULD BE REPORTED IMMEDIATELY:  *FEVER GREATER THAN 100.5 F  *CHILLS WITH OR WITHOUT FEVER  NAUSEA AND VOMITING THAT IS NOT CONTROLLED WITH YOUR NAUSEA MEDICATION  *UNUSUAL SHORTNESS OF BREATH  *UNUSUAL BRUISING OR BLEEDING  TENDERNESS IN MOUTH AND THROAT WITH OR WITHOUT PRESENCE OF ULCERS  *URINARY PROBLEMS  *BOWEL PROBLEMS  UNUSUAL RASH Items with * indicate a potential emergency and should be followed up as soon as possible.  One of the nurses will contact you 24 hours after your treatment. Please let the nurse know about any problems that you may have experienced. Feel free to call the clinic you have any questions or concerns. The clinic phone number is [phone].   I have been informed and understand all the instructions given to me. I know to contact the clinic, my physician, or go to the Emergency Department if any problems should occur. I do not have any questions at this time, but understand that I may call the clinic during office hours   should I have any questions or need assistance in obtaining follow up  care.    __________________________________________  _____________  __________ Signature of Patient or Authorized Representative            Date                   Time    __________________________________________ Nurse's Signature     Carboplatin injection What is this medicine? CARBOPLATIN (KAR boe pla tin) is a chemotherapy drug. It targets fast dividing cells, like cancer cells, and causes these cells to die. This medicine is used to treat ovarian cancer and many other cancers. This medicine may be used for other purposes; ask your health care provider or pharmacist if you have questions. What should I tell my health care provider before I take this medicine? They need to know if you have any of these conditions: -blood disorders -hearing problems -kidney disease -recent or ongoing radiation therapy -an unusual or allergic reaction to carboplatin, cisplatin, other chemotherapy, other medicines, foods, dyes, or preservatives -pregnant or trying to get pregnant -breast-feeding How should I use this medicine? This drug is usually given as an infusion into a vein. It is administered in a hospital or clinic by a specially trained health care professional. Talk to your pediatrician regarding the use of this medicine in children. Special care may be needed. Overdosage: If you think you have taken too much of this medicine contact a poison control center or emergency room at once. NOTE: This medicine is only for you. Do not share this medicine with others. What if I miss a dose? It is important not   to miss a dose. Call your doctor or health care professional if you are unable to keep an appointment. What may interact with this medicine? -medicines for seizures -medicines to increase blood counts like filgrastim, pegfilgrastim, sargramostim -some antibiotics like amikacin, gentamicin, neomycin, streptomycin, tobramycin -vaccines Talk to your doctor or health care professional  before taking any of these medicines: -acetaminophen -aspirin -ibuprofen -ketoprofen -naproxen This list may not describe all possible interactions. Give your health care provider a list of all the medicines, herbs, non-prescription drugs, or dietary supplements you use. Also tell them if you smoke, drink alcohol, or use illegal drugs. Some items may interact with your medicine. What should I watch for while using this medicine? Your condition will be monitored carefully while you are receiving this medicine. You will need important blood work done while you are taking this medicine. This drug may make you feel generally unwell. This is not uncommon, as chemotherapy can affect healthy cells as well as cancer cells. Report any side effects. Continue your course of treatment even though you feel ill unless your doctor tells you to stop. In some cases, you may be given additional medicines to help with side effects. Follow all directions for their use. Call your doctor or health care professional for advice if you get a fever, chills or sore throat, or other symptoms of a cold or flu. Do not treat yourself. This drug decreases your body's ability to fight infections. Try to avoid being around people who are sick. This medicine may increase your risk to bruise or bleed. Call your doctor or health care professional if you notice any unusual bleeding. Be careful brushing and flossing your teeth or using a toothpick because you may get an infection or bleed more easily. If you have any dental work done, tell your dentist you are receiving this medicine. Avoid taking products that contain aspirin, acetaminophen, ibuprofen, naproxen, or ketoprofen unless instructed by your doctor. These medicines may hide a fever. Do not become pregnant while taking this medicine. Women should inform their doctor if they wish to become pregnant or think they might be pregnant. There is a potential for serious side effects to  an unborn child. Talk to your health care professional or pharmacist for more information. Do not breast-feed an infant while taking this medicine. What side effects may I notice from receiving this medicine? Side effects that you should report to your doctor or health care professional as soon as possible: -allergic reactions like skin rash, itching or hives, swelling of the face, lips, or tongue -signs of infection - fever or chills, cough, sore throat, pain or difficulty passing urine -signs of decreased platelets or bleeding - bruising, pinpoint red spots on the skin, black, tarry stools, nosebleeds -signs of decreased red blood cells - unusually weak or tired, fainting spells, lightheadedness -breathing problems -changes in hearing -changes in vision -chest pain -high blood pressure -low blood counts - This drug may decrease the number of white blood cells, red blood cells and platelets. You may be at increased risk for infections and bleeding. -nausea and vomiting -pain, swelling, redness or irritation at the injection site -pain, tingling, numbness in the hands or feet -problems with balance, talking, walking -trouble passing urine or change in the amount of urine Side effects that usually do not require medical attention (report to your doctor or health care professional if they continue or are bothersome): -hair loss -loss of appetite -metallic taste in the mouth or changes in taste   This list may not describe all possible side effects. Call your doctor for medical advice about side effects. You may report side effects to FDA at 1-800-FDA-1088. Where should I keep my medicine? This drug is given in a hospital or clinic and will not be stored at home. NOTE: This sheet is a summary. It may not cover all possible information. If you have questions about this medicine, talk to your doctor, pharmacist, or health care provider.  2012, Elsevier/Gold Standard. (01/19/2008 2:38:05  PM)   Trastuzumab injection for infusion  What is this medicine? TRASTUZUMAB (tras TOO zoo mab) is a monoclonal antibody. It targets a protein called HER2. This protein is found in some stomach and breast cancers. This medicine can stop cancer cell growth. This medicine may be used with other cancer treatments. This medicine may be used for other purposes; ask your health care provider or pharmacist if you have questions. What should I tell my health care provider before I take this medicine? They need to know if you have any of these conditions: -heart disease -heart failure -infection (especially a virus infection such as chickenpox, cold sores, or herpes) -lung or breathing disease, like asthma -recent or ongoing radiation therapy -an unusual or allergic reaction to trastuzumab, benzyl alcohol, or other medications, foods, dyes, or preservatives -pregnant or trying to get pregnant -breast-feeding How should I use this medicine? This drug is given as an infusion into a vein. It is administered in a hospital or clinic by a specially trained health care professional. Talk to your pediatrician regarding the use of this medicine in children. This medicine is not approved for use in children. Overdosage: If you think you have taken too much of this medicine contact a poison control center or emergency room at once. NOTE: This medicine is only for you. Do not share this medicine with others. What if I miss a dose? It is important not to miss a dose. Call your doctor or health care professional if you are unable to keep an appointment. What may interact with this medicine? -cyclophosphamide -doxorubicin -warfarin This list may not describe all possible interactions. Give your health care provider a list of all the medicines, herbs, non-prescription drugs, or dietary supplements you use. Also tell them if you smoke, drink alcohol, or use illegal drugs. Some items may interact with your  medicine. What should I watch for while using this medicine? Visit your doctor for checks on your progress. Report any side effects. Continue your course of treatment even though you feel ill unless your doctor tells you to stop. Call your doctor or health care professional for advice if you get a fever, chills or sore throat, or other symptoms of a cold or flu. Do not treat yourself. Try to avoid being around people who are sick. You may experience fever, chills and shaking during your first infusion. These effects are usually mild and can be treated with other medicines. Report any side effects during the infusion to your health care professional. Fever and chills usually do not happen with later infusions. What side effects may I notice from receiving this medicine? Side effects that you should report to your doctor or other health care professional as soon as possible: -breathing difficulties -chest pain or palpitations -cough -dizziness or fainting -fever or chills, sore throat -skin rash, itching or hives -swelling of the legs or ankles -unusually weak or tired Side effects that usually do not require medical attention (report to your doctor or other health   care professional if they continue or are bothersome): -loss of appetite -headache -muscle aches -nausea This list may not describe all possible side effects. Call your doctor for medical advice about side effects. You may report side effects to FDA at 1-800-FDA-1088. Where should I keep my medicine? This drug is given in a hospital or clinic and will not be stored at home. NOTE: This sheet is a summary. It may not cover all possible information. If you have questions about this medicine, talk to your doctor, pharmacist, or health care provider.  2012, Elsevier/Gold Standard. (08/18/2009 1:43:15 PM)   Docetaxel injection  What is this medicine? DOCETAXEL (doe se TAX el) is a chemotherapy drug. It targets fast dividing cells,  like cancer cells, and causes these cells to die. This medicine is used to treat many types of cancers like breast cancer, certain stomach cancers, head and neck cancer, lung cancer, and prostate cancer. This medicine may be used for other purposes; ask your health care provider or pharmacist if you have questions. What should I tell my health care provider before I take this medicine? They need to know if you have any of these conditions: -infection (especially a virus infection such as chickenpox, cold sores, or herpes) -liver disease -low blood counts, like low white cell, platelet, or red cell counts -an unusual or allergic reaction to docetaxel, polysorbate 80, other chemotherapy agents, other medicines, foods, dyes, or preservatives -pregnant or trying to get pregnant -breast-feeding How should I use this medicine? This drug is given as an infusion into a vein. It is administered in a hospital or clinic by a specially trained health care professional. Talk to your pediatrician regarding the use of this medicine in children. Special care may be needed. Overdosage: If you think you have taken too much of this medicine contact a poison control center or emergency room at once. NOTE: This medicine is only for you. Do not share this medicine with others. What if I miss a dose? It is important not to miss your dose. Call your doctor or health care professional if you are unable to keep an appointment. What may interact with this medicine? -cyclosporine -erythromycin -ketoconazole -medicines to increase blood counts like filgrastim, pegfilgrastim, sargramostim -vaccines Talk to your doctor or health care professional before taking any of these medicines: -acetaminophen -aspirin -ibuprofen -ketoprofen -naproxen This list may not describe all possible interactions. Give your health care provider a list of all the medicines, herbs, non-prescription drugs, or dietary supplements you use. Also  tell them if you smoke, drink alcohol, or use illegal drugs. Some items may interact with your medicine. What should I watch for while using this medicine? Your condition will be monitored carefully while you are receiving this medicine. You will need important blood work done while you are taking this medicine. This drug may make you feel generally unwell. This is not uncommon, as chemotherapy can affect healthy cells as well as cancer cells. Report any side effects. Continue your course of treatment even though you feel ill unless your doctor tells you to stop. In some cases, you may be given additional medicines to help with side effects. Follow all directions for their use. Call your doctor or health care professional for advice if you get a fever, chills or sore throat, or other symptoms of a cold or flu. Do not treat yourself. This drug decreases your body's ability to fight infections. Try to avoid being around people who are sick. This medicine may increase   your risk to bruise or bleed. Call your doctor or health care professional if you notice any unusual bleeding. Be careful brushing and flossing your teeth or using a toothpick because you may get an infection or bleed more easily. If you have any dental work done, tell your dentist you are receiving this medicine. Avoid taking products that contain aspirin, acetaminophen, ibuprofen, naproxen, or ketoprofen unless instructed by your doctor. These medicines may hide a fever. Do not become pregnant while taking this medicine. Women should inform their doctor if they wish to become pregnant or think they might be pregnant. There is a potential for serious side effects to an unborn child. Talk to your health care professional or pharmacist for more information. Do not breast-feed an infant while taking this medicine. What side effects may I notice from receiving this medicine? Side effects that you should report to your doctor or health care  professional as soon as possible: -allergic reactions like skin rash, itching or hives, swelling of the face, lips, or tongue -low blood counts - This drug may decrease the number of white blood cells, red blood cells and platelets. You may be at increased risk for infections and bleeding. -signs of infection - fever or chills, cough, sore throat, pain or difficulty passing urine -signs of decreased platelets or bleeding - bruising, pinpoint red spots on the skin, black, tarry stools, nosebleeds -signs of decreased red blood cells - unusually weak or tired, fainting spells, lightheadedness -breathing problems -fast or irregular heartbeat -low blood pressure -mouth sores -nausea and vomiting -pain, swelling, redness or irritation at the injection site -pain, tingling, numbness in the hands or feet -swelling of the ankle, feet, hands -weight gain Side effects that usually do not require medical attention (report to your prescriber or health care professional if they continue or are bothersome): -bone pain -complete hair loss including hair on your head, underarms, pubic hair, eyebrows, and eyelashes -diarrhea -excessive tearing -changes in the color of fingernails -loosening of the fingernails -nausea -muscle pain -red flush to skin -sweating -weak or tired This list may not describe all possible side effects. Call your doctor for medical advice about side effects. You may report side effects to FDA at 1-800-FDA-1088. Where should I keep my medicine? This drug is given in a hospital or clinic and will not be stored at home. NOTE: This sheet is a summary. It may not cover all possible information. If you have questions about this medicine, talk to your doctor, pharmacist, or health care provider.  2012, Elsevier/Gold Standard. (09/26/2008 11:52:10 AM)    

## 2012-03-07 ENCOUNTER — Ambulatory Visit (HOSPITAL_BASED_OUTPATIENT_CLINIC_OR_DEPARTMENT_OTHER): Payer: Federal, State, Local not specified - PPO

## 2012-03-07 VITALS — BP 145/90 | HR 93 | Temp 97.5°F

## 2012-03-07 DIAGNOSIS — C50519 Malignant neoplasm of lower-outer quadrant of unspecified female breast: Secondary | ICD-10-CM

## 2012-03-07 DIAGNOSIS — C50919 Malignant neoplasm of unspecified site of unspecified female breast: Secondary | ICD-10-CM

## 2012-03-07 DIAGNOSIS — Z5189 Encounter for other specified aftercare: Secondary | ICD-10-CM

## 2012-03-07 MED ORDER — PEGFILGRASTIM INJECTION 6 MG/0.6ML
6.0000 mg | Freq: Once | SUBCUTANEOUS | Status: AC
  Administered 2012-03-07: 6 mg via SUBCUTANEOUS

## 2012-03-09 ENCOUNTER — Ambulatory Visit: Payer: Federal, State, Local not specified - PPO

## 2012-03-09 ENCOUNTER — Other Ambulatory Visit: Payer: Federal, State, Local not specified - PPO | Admitting: Lab

## 2012-03-09 ENCOUNTER — Encounter: Payer: Self-pay | Admitting: Hematology & Oncology

## 2012-03-13 ENCOUNTER — Other Ambulatory Visit (HOSPITAL_COMMUNITY): Payer: Federal, State, Local not specified - PPO

## 2012-03-13 ENCOUNTER — Telehealth: Payer: Self-pay | Admitting: *Deleted

## 2012-03-13 NOTE — Telephone Encounter (Signed)
Pt called with concerns about tingling in her left foot. She went for a "long walk" on Wed and thinks it could be from that. Explained that the tingling could also be from either the Carbo or Taxotere. She denies any swelling or difficulties with gait. Answered her questions regarding heartburn and eating "Bojangles" post chemo. She verbalized understanding of all of the information given to her and knows to call back if she has any further questions and/or concerns.

## 2012-03-18 ENCOUNTER — Ambulatory Visit (HOSPITAL_COMMUNITY)
Admission: RE | Admit: 2012-03-18 | Discharge: 2012-03-18 | Disposition: A | Discharge status: Home / Self Care | Payer: Federal, State, Local not specified - PPO | Source: Ambulatory Visit | Attending: Hematology & Oncology | Admitting: Hematology & Oncology

## 2012-03-18 DIAGNOSIS — Z8249 Family history of ischemic heart disease and other diseases of the circulatory system: Secondary | ICD-10-CM | POA: Insufficient documentation

## 2012-03-18 DIAGNOSIS — I517 Cardiomegaly: Secondary | ICD-10-CM

## 2012-03-18 DIAGNOSIS — C50919 Malignant neoplasm of unspecified site of unspecified female breast: Secondary | ICD-10-CM | POA: Insufficient documentation

## 2012-03-18 DIAGNOSIS — Z01818 Encounter for other preprocedural examination: Secondary | ICD-10-CM | POA: Insufficient documentation

## 2012-03-18 NOTE — Progress Notes (Signed)
  Echocardiogram 2D Echocardiogram has been performed.  Cathie Beams Deneen 03/18/2012, 11:31 AM

## 2012-03-19 ENCOUNTER — Telehealth: Payer: Self-pay | Admitting: *Deleted

## 2012-03-19 DIAGNOSIS — K1379 Other lesions of oral mucosa: Secondary | ICD-10-CM

## 2012-03-19 MED ORDER — MAGIC MOUTHWASH W/LIDOCAINE: 5.0000 mL | Freq: Four times a day (QID) | ORAL | Status: DC | PRN

## 2012-03-19 NOTE — Telephone Encounter (Signed)
Pt called with c/o sores on the back of her tongue. Denies any white patches or coating on her tongue. To try warm salt water and baking soda rinses and Magic Mouthwash QID prn. Instructed on the use of both with understanding. She will call if her sx worsen or do not improve.

## 2012-03-27 ENCOUNTER — Ambulatory Visit (HOSPITAL_BASED_OUTPATIENT_CLINIC_OR_DEPARTMENT_OTHER): Payer: Federal, State, Local not specified - PPO

## 2012-03-27 ENCOUNTER — Ambulatory Visit (HOSPITAL_BASED_OUTPATIENT_CLINIC_OR_DEPARTMENT_OTHER): Payer: Federal, State, Local not specified - PPO | Admitting: Hematology & Oncology

## 2012-03-27 ENCOUNTER — Other Ambulatory Visit (HOSPITAL_BASED_OUTPATIENT_CLINIC_OR_DEPARTMENT_OTHER): Payer: Federal, State, Local not specified - PPO | Admitting: Lab

## 2012-03-27 VITALS — BP 139/93 | HR 73 | Temp 97.7°F | Ht 65.0 in | Wt 160.0 lb

## 2012-03-27 DIAGNOSIS — C779 Secondary and unspecified malignant neoplasm of lymph node, unspecified: Secondary | ICD-10-CM

## 2012-03-27 DIAGNOSIS — C50919 Malignant neoplasm of unspecified site of unspecified female breast: Secondary | ICD-10-CM

## 2012-03-27 DIAGNOSIS — Z5112 Encounter for antineoplastic immunotherapy: Secondary | ICD-10-CM

## 2012-03-27 DIAGNOSIS — Z5111 Encounter for antineoplastic chemotherapy: Secondary | ICD-10-CM

## 2012-03-27 DIAGNOSIS — C50419 Malignant neoplasm of upper-outer quadrant of unspecified female breast: Secondary | ICD-10-CM

## 2012-03-27 LAB — CBC WITH DIFFERENTIAL (CANCER CENTER ONLY)
BASO%: 0.7 % (ref 0.0–2.0)
LYMPH%: 30.4 % (ref 14.0–48.0)
MCH: 30.6 pg (ref 26.0–34.0)
MCV: 93 fL (ref 81–101)
MONO#: 0.7 10*3/uL (ref 0.1–0.9)
MONO%: 12 % (ref 0.0–13.0)
Platelets: 231 10*3/uL (ref 145–400)
RDW: 13.7 % (ref 11.1–15.7)
WBC: 5.5 10*3/uL (ref 3.9–10.0)

## 2012-03-27 MED ORDER — SODIUM CHLORIDE 0.9 % IV SOLN
Freq: Once | INTRAVENOUS | Status: AC
  Administered 2012-03-27: 12:00:00 via INTRAVENOUS

## 2012-03-27 MED ORDER — ACETAMINOPHEN 325 MG PO TABS
650.0000 mg | ORAL_TABLET | Freq: Once | ORAL | Status: AC
  Administered 2012-03-27: 650 mg via ORAL

## 2012-03-27 MED ORDER — TRASTUZUMAB CHEMO INJECTION 440 MG
6.0000 mg/kg | Freq: Once | INTRAVENOUS | Status: AC
  Administered 2012-03-27: 420 mg via INTRAVENOUS
  Filled 2012-03-27: qty 20

## 2012-03-27 MED ORDER — DIPHENHYDRAMINE HCL 25 MG PO CAPS
50.0000 mg | ORAL_CAPSULE | Freq: Once | ORAL | Status: AC
  Administered 2012-03-27: 50 mg via ORAL

## 2012-03-27 MED ORDER — DOCETAXEL CHEMO INJECTION 160 MG/16ML
75.0000 mg/m2 | Freq: Once | INTRAVENOUS | Status: AC
  Administered 2012-03-27: 140 mg via INTRAVENOUS
  Filled 2012-03-27: qty 14

## 2012-03-27 MED ORDER — ONDANSETRON 16 MG/50ML IVPB (CHCC)
16.0000 mg | Freq: Once | INTRAVENOUS | Status: AC
  Administered 2012-03-27: 16 mg via INTRAVENOUS

## 2012-03-27 MED ORDER — HEPARIN SOD (PORK) LOCK FLUSH 100 UNIT/ML IV SOLN
500.0000 [IU] | Freq: Once | INTRAVENOUS | Status: AC | PRN
  Administered 2012-03-27: 500 [IU]
  Filled 2012-03-27: qty 5

## 2012-03-27 MED ORDER — SODIUM CHLORIDE 0.9 % IV SOLN
667.2000 mg | Freq: Once | INTRAVENOUS | Status: AC
  Administered 2012-03-27: 670 mg via INTRAVENOUS
  Filled 2012-03-27: qty 67

## 2012-03-27 MED ORDER — DEXAMETHASONE SODIUM PHOSPHATE 4 MG/ML IJ SOLN
20.0000 mg | Freq: Once | INTRAMUSCULAR | Status: AC
  Administered 2012-03-27: 20 mg via INTRAVENOUS

## 2012-03-27 MED ORDER — SODIUM CHLORIDE 0.9 % IJ SOLN
10.0000 mL | INTRAMUSCULAR | Status: DC | PRN
  Administered 2012-03-27: 10 mL
  Filled 2012-03-27: qty 10

## 2012-03-27 NOTE — Progress Notes (Signed)
This office note has been dictated.

## 2012-03-28 ENCOUNTER — Ambulatory Visit (HOSPITAL_BASED_OUTPATIENT_CLINIC_OR_DEPARTMENT_OTHER): Payer: Federal, State, Local not specified - PPO

## 2012-03-28 VITALS — BP 142/88 | HR 101 | Temp 98.8°F

## 2012-03-28 DIAGNOSIS — C50519 Malignant neoplasm of lower-outer quadrant of unspecified female breast: Secondary | ICD-10-CM

## 2012-03-28 DIAGNOSIS — Z5189 Encounter for other specified aftercare: Secondary | ICD-10-CM

## 2012-03-28 DIAGNOSIS — C50919 Malignant neoplasm of unspecified site of unspecified female breast: Secondary | ICD-10-CM

## 2012-03-28 LAB — COMPREHENSIVE METABOLIC PANEL
ALT: 12 U/L (ref 0–35)
AST: 16 U/L (ref 0–37)
Alkaline Phosphatase: 80 U/L (ref 39–117)
Sodium: 139 mEq/L (ref 135–145)
Total Bilirubin: 0.3 mg/dL (ref 0.3–1.2)
Total Protein: 6 g/dL (ref 6.0–8.3)

## 2012-03-28 LAB — LACTATE DEHYDROGENASE: LDH: 169 U/L (ref 94–250)

## 2012-03-28 MED ORDER — PEGFILGRASTIM INJECTION 6 MG/0.6ML
6.0000 mg | Freq: Once | SUBCUTANEOUS | Status: AC
  Administered 2012-03-28: 6 mg via SUBCUTANEOUS
  Filled 2012-03-28: qty 0.6

## 2012-03-30 NOTE — Progress Notes (Signed)
CC:   Sandria Bales. Ezzard Standing, M.D. Richard M. Marcelle Overlie, M.D.  DIAGNOSIS:  Stage IIB (T2 N1 M0) infiltrating ductal carcinoma of the right breast, ER/PR negative and HER2 positive.  CURRENT THERAPY:  The patient is status post cycle 1 of Taxotere/carboplatin/Herceptin.  INTERIM HISTORY:  Ms. Herbig comes in for her second cycle of chemotherapy.  Of note, we did do a complete staging workup on her.  She had an echocardiogram done which showed an excellent ejection fraction of 50%.  There is mild diffuse hypokinesis.  She had a bone scan done. The bone scan was negative for any evidence of bony mets.  She had a CT of the chest, abdomen and pelvis.  CT of the chest, abdomen and pelvis again not show any evidence of metastatic disease.  She got through her first cycle of chemotherapy fairly well.  She has not had any problems with nausea, vomiting.  She did have some diarrhea but she did feel this was because of what she ate.  She is on Neulasta post chemotherapy.  This has helped with her white cell count.  PHYSICAL EXAMINATION:  General:  This is a well-developed, well- nourished black female in no obvious distress.  Vital signs:  97.7, pulse 79, respiratory rate 18, blood pressure 139/93.  Weight is 160. Head and neck:  Exam shows a normocephalic, atraumatic skull.  There are no ocular or oral lesions.  There are no palpable cervical or supraclavicular lymph nodes.  Lungs:  Clear bilaterally.  Cardiac: Regular rate and rhythm with a normal S1 and S2.  There are no murmurs, rubs or bruits.  Abdomen:  Soft with good bowel sounds.  There is no palpable abdominal mass.  There is no fluid wave.  There is no palpable hepatosplenomegaly.  Back:  No tenderness over the spine, ribs or hips. Extremities:  Shows no clubbing, cyanosis or edema.  Neurological: Shows no focal neurological deficits.  LABORATORY STUDIES:  White cell count 5.5, hemoglobin 11.2, hematocrit 34.2, platelet count is  231.  IMPRESSION:  Ms. Farnell is a 61 year old African American female with stage IIB infiltrating ductal carcinoma of the right breast.  She underwent mastectomy.  She had 3 positive lymph nodes.  We are treating her with a very aggressive adjuvant chemotherapy program.  We will give her 6 cycles of chemotherapy.  I believe this would be appropriate.  We will then finish up an additional year of Herceptin.  We will plan to get her back to see [place] for cycle 3 of chemotherapy in 3 weeks.    ______________________________ Josph Macho, M.D. PRE/MEDQ  D:  03/27/2012  T:  03/27/2012  Job:  [id number]

## 2012-04-17 ENCOUNTER — Ambulatory Visit: Payer: Federal, State, Local not specified - PPO | Admitting: Hematology & Oncology

## 2012-04-17 ENCOUNTER — Ambulatory Visit: Payer: Federal, State, Local not specified - PPO

## 2012-04-17 ENCOUNTER — Other Ambulatory Visit: Payer: Federal, State, Local not specified - PPO | Admitting: Lab

## 2012-04-17 ENCOUNTER — Ambulatory Visit (HOSPITAL_BASED_OUTPATIENT_CLINIC_OR_DEPARTMENT_OTHER): Payer: Federal, State, Local not specified - PPO

## 2012-04-17 ENCOUNTER — Other Ambulatory Visit (HOSPITAL_BASED_OUTPATIENT_CLINIC_OR_DEPARTMENT_OTHER): Payer: Federal, State, Local not specified - PPO | Admitting: Lab

## 2012-04-17 ENCOUNTER — Ambulatory Visit (HOSPITAL_BASED_OUTPATIENT_CLINIC_OR_DEPARTMENT_OTHER): Payer: Federal, State, Local not specified - PPO | Admitting: Hematology & Oncology

## 2012-04-17 VITALS — BP 141/94 | HR 76 | Temp 98.2°F | Ht 65.0 in | Wt 159.0 lb

## 2012-04-17 DIAGNOSIS — C50419 Malignant neoplasm of upper-outer quadrant of unspecified female breast: Secondary | ICD-10-CM

## 2012-04-17 DIAGNOSIS — C50919 Malignant neoplasm of unspecified site of unspecified female breast: Secondary | ICD-10-CM

## 2012-04-17 DIAGNOSIS — Z5112 Encounter for antineoplastic immunotherapy: Secondary | ICD-10-CM

## 2012-04-17 DIAGNOSIS — Z9010 Acquired absence of unspecified breast and nipple: Secondary | ICD-10-CM

## 2012-04-17 DIAGNOSIS — Z171 Estrogen receptor negative status [ER-]: Secondary | ICD-10-CM

## 2012-04-17 DIAGNOSIS — Z5111 Encounter for antineoplastic chemotherapy: Secondary | ICD-10-CM

## 2012-04-17 LAB — COMPREHENSIVE METABOLIC PANEL
AST: 17 U/L (ref 0–37)
Alkaline Phosphatase: 90 U/L (ref 39–117)
BUN: 8 mg/dL (ref 6–23)
Creatinine, Ser: 0.66 mg/dL (ref 0.50–1.10)
Total Bilirubin: 0.3 mg/dL (ref 0.3–1.2)

## 2012-04-17 LAB — CBC WITH DIFFERENTIAL (CANCER CENTER ONLY)
BASO#: 0 10*3/uL (ref 0.0–0.2)
EOS%: 0.4 % (ref 0.0–7.0)
HCT: 35.4 % (ref 34.8–46.6)
HGB: 11.7 g/dL (ref 11.6–15.9)
LYMPH%: 33.3 % (ref 14.0–48.0)
MCH: 31.5 pg (ref 26.0–34.0)
MCHC: 33.1 g/dL (ref 32.0–36.0)
MONO%: 12.9 % (ref 0.0–13.0)
NEUT#: 2.5 10*3/uL (ref 1.5–6.5)
NEUT%: 52.8 % (ref 39.6–80.0)

## 2012-04-17 MED ORDER — SODIUM CHLORIDE 0.9 % IV SOLN
Freq: Once | INTRAVENOUS | Status: AC
  Administered 2012-04-17: 10:00:00 via INTRAVENOUS

## 2012-04-17 MED ORDER — SODIUM CHLORIDE 0.9 % IJ SOLN
3.0000 mL | INTRAMUSCULAR | Status: DC | PRN
  Filled 2012-04-17: qty 10

## 2012-04-17 MED ORDER — DOCETAXEL CHEMO INJECTION 160 MG/16ML
75.0000 mg/m2 | Freq: Once | INTRAVENOUS | Status: AC
  Administered 2012-04-17: 140 mg via INTRAVENOUS
  Filled 2012-04-17: qty 14

## 2012-04-17 MED ORDER — SODIUM CHLORIDE 0.9 % IJ SOLN
10.0000 mL | INTRAMUSCULAR | Status: DC | PRN
  Administered 2012-04-17: 10 mL
  Filled 2012-04-17: qty 10

## 2012-04-17 MED ORDER — TRASTUZUMAB CHEMO INJECTION 440 MG
6.0000 mg/kg | Freq: Once | INTRAVENOUS | Status: AC
  Administered 2012-04-17: 420 mg via INTRAVENOUS
  Filled 2012-04-17: qty 20

## 2012-04-17 MED ORDER — ALTEPLASE 2 MG IJ SOLR
2.0000 mg | Freq: Once | INTRAMUSCULAR | Status: DC | PRN
  Filled 2012-04-17: qty 2

## 2012-04-17 MED ORDER — SODIUM CHLORIDE 0.9 % IJ SOLN
10.0000 mL | INTRAMUSCULAR | Status: DC | PRN
  Filled 2012-04-17: qty 10

## 2012-04-17 MED ORDER — HEPARIN SOD (PORK) LOCK FLUSH 100 UNIT/ML IV SOLN
500.0000 [IU] | Freq: Once | INTRAVENOUS | Status: DC | PRN
  Filled 2012-04-17: qty 5

## 2012-04-17 MED ORDER — HEPARIN SOD (PORK) LOCK FLUSH 100 UNIT/ML IV SOLN
500.0000 [IU] | Freq: Once | INTRAVENOUS | Status: AC | PRN
  Administered 2012-04-17: 500 [IU]
  Filled 2012-04-17: qty 5

## 2012-04-17 MED ORDER — SODIUM CHLORIDE 0.9 % IV SOLN: Freq: Once | INTRAVENOUS | Status: DC

## 2012-04-17 MED ORDER — HEPARIN SOD (PORK) LOCK FLUSH 100 UNIT/ML IV SOLN
250.0000 [IU] | Freq: Once | INTRAVENOUS | Status: DC | PRN
  Filled 2012-04-17: qty 5

## 2012-04-17 MED ORDER — ONDANSETRON 16 MG/50ML IVPB (CHCC)
16.0000 mg | Freq: Once | INTRAVENOUS | Status: AC
  Administered 2012-04-17: 16 mg via INTRAVENOUS

## 2012-04-17 MED ORDER — ACETAMINOPHEN 325 MG PO TABS
650.0000 mg | ORAL_TABLET | Freq: Once | ORAL | Status: AC
  Administered 2012-04-17: 650 mg via ORAL

## 2012-04-17 MED ORDER — DEXAMETHASONE SODIUM PHOSPHATE 4 MG/ML IJ SOLN
20.0000 mg | Freq: Once | INTRAMUSCULAR | Status: AC
  Administered 2012-04-17: 20 mg via INTRAVENOUS

## 2012-04-17 MED ORDER — DIPHENHYDRAMINE HCL 25 MG PO CAPS
50.0000 mg | ORAL_CAPSULE | Freq: Once | ORAL | Status: AC
  Administered 2012-04-17: 50 mg via ORAL

## 2012-04-17 MED ORDER — SODIUM CHLORIDE 0.9 % IV SOLN
828.0000 mg | Freq: Once | INTRAVENOUS | Status: AC
  Administered 2012-04-17: 830 mg via INTRAVENOUS
  Filled 2012-04-17: qty 83

## 2012-04-17 NOTE — Progress Notes (Signed)
CC:   Duke Salvia. Marcelle Overlie, M.D. Sandria Bales. Ezzard Standing, M.D.  DIAGNOSIS:  Stage IIB (T2 N1 M0) ductal carcinoma of the right breast, ER/PR-negative and HER-2-positive.  CURRENT THERAPY:  The patient is status post 2 cycles of Taxotere/carboplatin/Herceptin.  INTERIM HISTORY:  Ms. [name] comes in for followup.  She has done very well.  She has really had no problems with chemo so far.  She has had very little in the way or nausea or vomiting.  She is eating fairly well.  She has not lost weight.  She has had no fevers, sweats or chills.  She has had no headache.  There has been no bleeding.  There has been no diarrhea.  She has had no rashes.  There have been no mouth sores.  PHYSICAL EXAMINATION:  This is a well-developed, well-nourished black female in no obvious distress.  Vital signs:  Temperature of 98.2, pulse 76, respiratory rate 20, blood pressure 144/94.  Weight is 159.  Head and neck:  Normocephalic, atraumatic skull.  There are no ocular or oral lesions.  There are no palpable cervical or supraclavicular lymph nodes. Lungs:  Clear bilaterally.  Cardiac:  Regular rate and rhythm with a normal S1 and S2.  There are no murmurs, rubs or bruits.  Abdomen:  Soft with good bowel sounds.  There is no palpable abdominal mass.  There is no fluid wave.  There is no palpable hepatosplenomegaly.  Back:  No tenderness over the spine, ribs, or hips.  Extremities:  No clubbing, cyanosis or edema.  Neurological:  No focal neurological deficits.  LABORATORY STUDIES:  A white count of 4.8, hemoglobin 11.7, hematocrit 35.4, platelet count 217.  IMPRESSION:  Maureen Duncan is a 61 year old African American female with stage IIB ductal carcinoma of the right breast.  She underwent mastectomy.  She had 3 positive lymph nodes.  So far, she has done well with chemotherapy.  We will go ahead with a with a 3rd cycle today.  She will get her Neulasta tomorrow.  We will have her come back in 3 weeks for her  4th cycle of treatment.    ______________________________ Josph Macho, M.D. PRE/MEDQ  D:  04/17/2012  T:  04/17/2012  Job:  [id number]

## 2012-04-17 NOTE — Patient Instructions (Signed)
[facility] Discharge Instructions for Patients Receiving Chemotherapy  Today you received the following chemotherapy agents Carboplatin, Taxotere, Herceptin  To help prevent nausea and vomiting after your treatment, we encourage you to take your nausea medication:  Zofran 8mg : 1 tab twice daily for 4 days starting the day after chemo then twice daily as needed   Decadron 4mg : 2 tabs twice daily for 4 days starting the day after chemo  Ativan 1mg : 1 tab every 8 hours as needed for nausea, vomiting, sleep, or anxiety (may also dissolve under tongue).  If you develop nausea and vomiting that is not controlled by your nausea medication, call the clinic. If it is after clinic hours your family physician or the after hours number for the clinic or go to the Emergency Department.   BELOW ARE SYMPTOMS THAT SHOULD BE REPORTED IMMEDIATELY:  *FEVER GREATER THAN 100.5 F  *CHILLS WITH OR WITHOUT FEVER  NAUSEA AND VOMITING THAT IS NOT CONTROLLED WITH YOUR NAUSEA MEDICATION  *UNUSUAL SHORTNESS OF BREATH  *UNUSUAL BRUISING OR BLEEDING  TENDERNESS IN MOUTH AND THROAT WITH OR WITHOUT PRESENCE OF ULCERS  *URINARY PROBLEMS  *BOWEL PROBLEMS  UNUSUAL RASH Items with * indicate a potential emergency and should be followed up as soon as possible.  One of the nurses will contact you 24 hours after your treatment. Please let the nurse know about any problems that you may have experienced. Feel free to call the clinic you have any questions or concerns. The clinic phone number is [phone].   I have been informed and understand all the instructions given to me. I know to contact the clinic, my physician, or go to the Emergency Department if any problems should occur. I do not have any questions at this time, but understand that I may call the clinic during office hours   should I have any questions or need assistance in obtaining follow up  care.    __________________________________________  _____________  __________ Signature of Patient or Authorized Representative            Date                   Time    __________________________________________ Nurse's Signature     Carboplatin injection What is this medicine? CARBOPLATIN (KAR boe pla tin) is a chemotherapy drug. It targets fast dividing cells, like cancer cells, and causes these cells to die. This medicine is used to treat ovarian cancer and many other cancers. This medicine may be used for other purposes; ask your health care provider or pharmacist if you have questions. What should I tell my health care provider before I take this medicine? They need to know if you have any of these conditions: -blood disorders -hearing problems -kidney disease -recent or ongoing radiation therapy -an unusual or allergic reaction to carboplatin, cisplatin, other chemotherapy, other medicines, foods, dyes, or preservatives -pregnant or trying to get pregnant -breast-feeding How should I use this medicine? This drug is usually given as an infusion into a vein. It is administered in a hospital or clinic by a specially trained health care professional. Talk to your pediatrician regarding the use of this medicine in children. Special care may be needed. Overdosage: If you think you have taken too much of this medicine contact a poison control center or emergency room at once. NOTE: This medicine is only for you. Do not share this medicine with others. What if I miss a dose? It is important not  to miss a dose. Call your doctor or health care professional if you are unable to keep an appointment. What may interact with this medicine? -medicines for seizures -medicines to increase blood counts like filgrastim, pegfilgrastim, sargramostim -some antibiotics like amikacin, gentamicin, neomycin, streptomycin, tobramycin -vaccines Talk to your doctor or health care professional  before taking any of these medicines: -acetaminophen -aspirin -ibuprofen -ketoprofen -naproxen This list may not describe all possible interactions. Give your health care provider a list of all the medicines, herbs, non-prescription drugs, or dietary supplements you use. Also tell them if you smoke, drink alcohol, or use illegal drugs. Some items may interact with your medicine. What should I watch for while using this medicine? Your condition will be monitored carefully while you are receiving this medicine. You will need important blood work done while you are taking this medicine. This drug may make you feel generally unwell. This is not uncommon, as chemotherapy can affect healthy cells as well as cancer cells. Report any side effects. Continue your course of treatment even though you feel ill unless your doctor tells you to stop. In some cases, you may be given additional medicines to help with side effects. Follow all directions for their use. Call your doctor or health care professional for advice if you get a fever, chills or sore throat, or other symptoms of a cold or flu. Do not treat yourself. This drug decreases your body's ability to fight infections. Try to avoid being around people who are sick. This medicine may increase your risk to bruise or bleed. Call your doctor or health care professional if you notice any unusual bleeding. Be careful brushing and flossing your teeth or using a toothpick because you may get an infection or bleed more easily. If you have any dental work done, tell your dentist you are receiving this medicine. Avoid taking products that contain aspirin, acetaminophen, ibuprofen, naproxen, or ketoprofen unless instructed by your doctor. These medicines may hide a fever. Do not become pregnant while taking this medicine. Women should inform their doctor if they wish to become pregnant or think they might be pregnant. There is a potential for serious side effects to  an unborn child. Talk to your health care professional or pharmacist for more information. Do not breast-feed an infant while taking this medicine. What side effects may I notice from receiving this medicine? Side effects that you should report to your doctor or health care professional as soon as possible: -allergic reactions like skin rash, itching or hives, swelling of the face, lips, or tongue -signs of infection - fever or chills, cough, sore throat, pain or difficulty passing urine -signs of decreased platelets or bleeding - bruising, pinpoint red spots on the skin, black, tarry stools, nosebleeds -signs of decreased red blood cells - unusually weak or tired, fainting spells, lightheadedness -breathing problems -changes in hearing -changes in vision -chest pain -high blood pressure -low blood counts - This drug may decrease the number of white blood cells, red blood cells and platelets. You may be at increased risk for infections and bleeding. -nausea and vomiting -pain, swelling, redness or irritation at the injection site -pain, tingling, numbness in the hands or feet -problems with balance, talking, walking -trouble passing urine or change in the amount of urine Side effects that usually do not require medical attention (report to your doctor or health care professional if they continue or are bothersome): -hair loss -loss of appetite -metallic taste in the mouth or changes in taste  This list may not describe all possible side effects. Call your doctor for medical advice about side effects. You may report side effects to FDA at 1-800-FDA-1088. Where should I keep my medicine? This drug is given in a hospital or clinic and will not be stored at home. NOTE: This sheet is a summary. It may not cover all possible information. If you have questions about this medicine, talk to your doctor, pharmacist, or health care provider.  2012, Elsevier/Gold Standard. (01/19/2008 2:38:05  PM)   Trastuzumab injection for infusion  What is this medicine? TRASTUZUMAB (tras TOO zoo mab) is a monoclonal antibody. It targets a protein called HER2. This protein is found in some stomach and breast cancers. This medicine can stop cancer cell growth. This medicine may be used with other cancer treatments. This medicine may be used for other purposes; ask your health care provider or pharmacist if you have questions. What should I tell my health care provider before I take this medicine? They need to know if you have any of these conditions: -heart disease -heart failure -infection (especially a virus infection such as chickenpox, cold sores, or herpes) -lung or breathing disease, like asthma -recent or ongoing radiation therapy -an unusual or allergic reaction to trastuzumab, benzyl alcohol, or other medications, foods, dyes, or preservatives -pregnant or trying to get pregnant -breast-feeding How should I use this medicine? This drug is given as an infusion into a vein. It is administered in a hospital or clinic by a specially trained health care professional. Talk to your pediatrician regarding the use of this medicine in children. This medicine is not approved for use in children. Overdosage: If you think you have taken too much of this medicine contact a poison control center or emergency room at once. NOTE: This medicine is only for you. Do not share this medicine with others. What if I miss a dose? It is important not to miss a dose. Call your doctor or health care professional if you are unable to keep an appointment. What may interact with this medicine? -cyclophosphamide -doxorubicin -warfarin This list may not describe all possible interactions. Give your health care provider a list of all the medicines, herbs, non-prescription drugs, or dietary supplements you use. Also tell them if you smoke, drink alcohol, or use illegal drugs. Some items may interact with your  medicine. What should I watch for while using this medicine? Visit your doctor for checks on your progress. Report any side effects. Continue your course of treatment even though you feel ill unless your doctor tells you to stop. Call your doctor or health care professional for advice if you get a fever, chills or sore throat, or other symptoms of a cold or flu. Do not treat yourself. Try to avoid being around people who are sick. You may experience fever, chills and shaking during your first infusion. These effects are usually mild and can be treated with other medicines. Report any side effects during the infusion to your health care professional. Fever and chills usually do not happen with later infusions. What side effects may I notice from receiving this medicine? Side effects that you should report to your doctor or other health care professional as soon as possible: -breathing difficulties -chest pain or palpitations -cough -dizziness or fainting -fever or chills, sore throat -skin rash, itching or hives -swelling of the legs or ankles -unusually weak or tired Side effects that usually do not require medical attention (report to your doctor or other health  care professional if they continue or are bothersome): -loss of appetite -headache -muscle aches -nausea This list may not describe all possible side effects. Call your doctor for medical advice about side effects. You may report side effects to FDA at 1-800-FDA-1088. Where should I keep my medicine? This drug is given in a hospital or clinic and will not be stored at home. NOTE: This sheet is a summary. It may not cover all possible information. If you have questions about this medicine, talk to your doctor, pharmacist, or health care provider.  2012, Elsevier/Gold Standard. (08/18/2009 1:43:15 PM)   Docetaxel injection  What is this medicine? DOCETAXEL (doe se TAX el) is a chemotherapy drug. It targets fast dividing cells,  like cancer cells, and causes these cells to die. This medicine is used to treat many types of cancers like breast cancer, certain stomach cancers, head and neck cancer, lung cancer, and prostate cancer. This medicine may be used for other purposes; ask your health care provider or pharmacist if you have questions. What should I tell my health care provider before I take this medicine? They need to know if you have any of these conditions: -infection (especially a virus infection such as chickenpox, cold sores, or herpes) -liver disease -low blood counts, like low white cell, platelet, or red cell counts -an unusual or allergic reaction to docetaxel, polysorbate 80, other chemotherapy agents, other medicines, foods, dyes, or preservatives -pregnant or trying to get pregnant -breast-feeding How should I use this medicine? This drug is given as an infusion into a vein. It is administered in a hospital or clinic by a specially trained health care professional. Talk to your pediatrician regarding the use of this medicine in children. Special care may be needed. Overdosage: If you think you have taken too much of this medicine contact a poison control center or emergency room at once. NOTE: This medicine is only for you. Do not share this medicine with others. What if I miss a dose? It is important not to miss your dose. Call your doctor or health care professional if you are unable to keep an appointment. What may interact with this medicine? -cyclosporine -erythromycin -ketoconazole -medicines to increase blood counts like filgrastim, pegfilgrastim, sargramostim -vaccines Talk to your doctor or health care professional before taking any of these medicines: -acetaminophen -aspirin -ibuprofen -ketoprofen -naproxen This list may not describe all possible interactions. Give your health care provider a list of all the medicines, herbs, non-prescription drugs, or dietary supplements you use. Also  tell them if you smoke, drink alcohol, or use illegal drugs. Some items may interact with your medicine. What should I watch for while using this medicine? Your condition will be monitored carefully while you are receiving this medicine. You will need important blood work done while you are taking this medicine. This drug may make you feel generally unwell. This is not uncommon, as chemotherapy can affect healthy cells as well as cancer cells. Report any side effects. Continue your course of treatment even though you feel ill unless your doctor tells you to stop. In some cases, you may be given additional medicines to help with side effects. Follow all directions for their use. Call your doctor or health care professional for advice if you get a fever, chills or sore throat, or other symptoms of a cold or flu. Do not treat yourself. This drug decreases your body's ability to fight infections. Try to avoid being around people who are sick. This medicine may increase  your risk to bruise or bleed. Call your doctor or health care professional if you notice any unusual bleeding. Be careful brushing and flossing your teeth or using a toothpick because you may get an infection or bleed more easily. If you have any dental work done, tell your dentist you are receiving this medicine. Avoid taking products that contain aspirin, acetaminophen, ibuprofen, naproxen, or ketoprofen unless instructed by your doctor. These medicines may hide a fever. Do not become pregnant while taking this medicine. Women should inform their doctor if they wish to become pregnant or think they might be pregnant. There is a potential for serious side effects to an unborn child. Talk to your health care professional or pharmacist for more information. Do not breast-feed an infant while taking this medicine. What side effects may I notice from receiving this medicine? Side effects that you should report to your doctor or health care  professional as soon as possible: -allergic reactions like skin rash, itching or hives, swelling of the face, lips, or tongue -low blood counts - This drug may decrease the number of white blood cells, red blood cells and platelets. You may be at increased risk for infections and bleeding. -signs of infection - fever or chills, cough, sore throat, pain or difficulty passing urine -signs of decreased platelets or bleeding - bruising, pinpoint red spots on the skin, black, tarry stools, nosebleeds -signs of decreased red blood cells - unusually weak or tired, fainting spells, lightheadedness -breathing problems -fast or irregular heartbeat -low blood pressure -mouth sores -nausea and vomiting -pain, swelling, redness or irritation at the injection site -pain, tingling, numbness in the hands or feet -swelling of the ankle, feet, hands -weight gain Side effects that usually do not require medical attention (report to your prescriber or health care professional if they continue or are bothersome): -bone pain -complete hair loss including hair on your head, underarms, pubic hair, eyebrows, and eyelashes -diarrhea -excessive tearing -changes in the color of fingernails -loosening of the fingernails -nausea -muscle pain -red flush to skin -sweating -weak or tired This list may not describe all possible side effects. Call your doctor for medical advice about side effects. You may report side effects to FDA at 1-800-FDA-1088. Where should I keep my medicine? This drug is given in a hospital or clinic and will not be stored at home. NOTE: This sheet is a summary. It may not cover all possible information. If you have questions about this medicine, talk to your doctor, pharmacist, or health care provider.  2012, Elsevier/Gold Standard. (09/26/2008 11:52:10 AM)

## 2012-04-17 NOTE — Progress Notes (Signed)
This office note has been dictated.

## 2012-04-18 ENCOUNTER — Ambulatory Visit (HOSPITAL_BASED_OUTPATIENT_CLINIC_OR_DEPARTMENT_OTHER): Payer: Federal, State, Local not specified - PPO

## 2012-04-18 VITALS — BP 154/95 | HR 98 | Temp 97.4°F

## 2012-04-18 DIAGNOSIS — C50419 Malignant neoplasm of upper-outer quadrant of unspecified female breast: Secondary | ICD-10-CM

## 2012-04-18 DIAGNOSIS — C50919 Malignant neoplasm of unspecified site of unspecified female breast: Secondary | ICD-10-CM

## 2012-04-18 DIAGNOSIS — Z5189 Encounter for other specified aftercare: Secondary | ICD-10-CM

## 2012-04-18 MED ORDER — PEGFILGRASTIM INJECTION 6 MG/0.6ML
6.0000 mg | Freq: Once | SUBCUTANEOUS | Status: AC
  Administered 2012-04-18: 6 mg via SUBCUTANEOUS

## 2012-05-08 ENCOUNTER — Ambulatory Visit (HOSPITAL_BASED_OUTPATIENT_CLINIC_OR_DEPARTMENT_OTHER): Payer: Federal, State, Local not specified - PPO | Admitting: Hematology & Oncology

## 2012-05-08 ENCOUNTER — Ambulatory Visit (HOSPITAL_BASED_OUTPATIENT_CLINIC_OR_DEPARTMENT_OTHER): Payer: Federal, State, Local not specified - PPO

## 2012-05-08 ENCOUNTER — Other Ambulatory Visit (HOSPITAL_BASED_OUTPATIENT_CLINIC_OR_DEPARTMENT_OTHER): Payer: Federal, State, Local not specified - PPO | Admitting: Lab

## 2012-05-08 VITALS — BP 139/89 | HR 75 | Temp 98.0°F | Wt 161.0 lb

## 2012-05-08 DIAGNOSIS — C773 Secondary and unspecified malignant neoplasm of axilla and upper limb lymph nodes: Secondary | ICD-10-CM

## 2012-05-08 DIAGNOSIS — C50919 Malignant neoplasm of unspecified site of unspecified female breast: Secondary | ICD-10-CM

## 2012-05-08 DIAGNOSIS — Z5111 Encounter for antineoplastic chemotherapy: Secondary | ICD-10-CM

## 2012-05-08 DIAGNOSIS — Z5112 Encounter for antineoplastic immunotherapy: Secondary | ICD-10-CM

## 2012-05-08 LAB — COMPREHENSIVE METABOLIC PANEL
ALT: 11 U/L (ref 0–35)
AST: 19 U/L (ref 0–37)
Alkaline Phosphatase: 74 U/L (ref 39–117)
CO2: 25 mEq/L (ref 19–32)
Chloride: 107 mEq/L (ref 96–112)
Creatinine, Ser: 0.65 mg/dL (ref 0.50–1.10)
Glucose, Bld: 83 mg/dL (ref 70–99)
Potassium: 3.5 mEq/L (ref 3.5–5.3)
Sodium: 141 mEq/L (ref 135–145)
Total Bilirubin: 0.4 mg/dL (ref 0.3–1.2)

## 2012-05-08 LAB — CBC WITH DIFFERENTIAL (CANCER CENTER ONLY)
BASO#: 0 10*3/uL (ref 0.0–0.2)
Eosinophils Absolute: 0 10*3/uL (ref 0.0–0.5)
HGB: 10.3 g/dL — ABNORMAL LOW (ref 11.6–15.9)
LYMPH#: 1.6 10*3/uL (ref 0.9–3.3)
MCH: 32.6 pg (ref 26.0–34.0)
MONO#: 0.5 10*3/uL (ref 0.1–0.9)
NEUT#: 3.4 10*3/uL (ref 1.5–6.5)
Platelets: 136 10*3/uL — ABNORMAL LOW (ref 145–400)
RBC: 3.16 10*6/uL — ABNORMAL LOW (ref 3.70–5.32)
WBC: 5.5 10*3/uL (ref 3.9–10.0)

## 2012-05-08 MED ORDER — ACETAMINOPHEN 325 MG PO TABS
650.0000 mg | ORAL_TABLET | Freq: Once | ORAL | Status: AC
  Administered 2012-05-08: 650 mg via ORAL

## 2012-05-08 MED ORDER — SODIUM CHLORIDE 0.9 % IV SOLN
828.0000 mg | Freq: Once | INTRAVENOUS | Status: AC
  Administered 2012-05-08: 830 mg via INTRAVENOUS
  Filled 2012-05-08: qty 83

## 2012-05-08 MED ORDER — SODIUM CHLORIDE 0.9 % IV SOLN
Freq: Once | INTRAVENOUS | Status: AC
  Administered 2012-05-08: 10:00:00 via INTRAVENOUS

## 2012-05-08 MED ORDER — DOCETAXEL CHEMO INJECTION 160 MG/16ML
75.0000 mg/m2 | Freq: Once | INTRAVENOUS | Status: AC
  Administered 2012-05-08: 140 mg via INTRAVENOUS
  Filled 2012-05-08: qty 14

## 2012-05-08 MED ORDER — TRASTUZUMAB CHEMO INJECTION 440 MG
6.0000 mg/kg | Freq: Once | INTRAVENOUS | Status: AC
  Administered 2012-05-08: 420 mg via INTRAVENOUS
  Filled 2012-05-08: qty 20

## 2012-05-08 MED ORDER — ONDANSETRON 16 MG/50ML IVPB (CHCC)
16.0000 mg | Freq: Once | INTRAVENOUS | Status: AC
  Administered 2012-05-08: 16 mg via INTRAVENOUS

## 2012-05-08 MED ORDER — HEPARIN SOD (PORK) LOCK FLUSH 100 UNIT/ML IV SOLN
500.0000 [IU] | Freq: Once | INTRAVENOUS | Status: AC | PRN
  Administered 2012-05-08: 500 [IU]
  Filled 2012-05-08: qty 5

## 2012-05-08 MED ORDER — DEXAMETHASONE SODIUM PHOSPHATE 4 MG/ML IJ SOLN
20.0000 mg | Freq: Once | INTRAMUSCULAR | Status: AC
  Administered 2012-05-08: 20 mg via INTRAVENOUS

## 2012-05-08 MED ORDER — DIPHENHYDRAMINE HCL 25 MG PO CAPS
50.0000 mg | ORAL_CAPSULE | Freq: Once | ORAL | Status: AC
  Administered 2012-05-08: 50 mg via ORAL

## 2012-05-08 MED ORDER — SODIUM CHLORIDE 0.9 % IJ SOLN
10.0000 mL | INTRAMUSCULAR | Status: DC | PRN
  Administered 2012-05-08: 10 mL
  Filled 2012-05-08: qty 10

## 2012-05-08 NOTE — Progress Notes (Signed)
This office note has been dictated.

## 2012-05-08 NOTE — Progress Notes (Signed)
CC:   Maureen Duncan. Maureen Duncan, M.D. Maureen Duncan. Maureen Duncan, M.D.  DIAGNOSIS:  Stage IIB (T2 N1 M0) ductal carcinoma of the right breast (estrogen receptor/progesterone receptor-negative/HER-2-positive).  CURRENT THERAPY:  The patient is status post 3 cycles TCH.  INTERIM HISTORY:  Maureen Duncan comes in for followup.  She has done well with chemotherapy.  She has had a little bit of fatigue.  She is eating okay.  There is no diarrhea.  She has had no fever.  There have been no mouth sores.  She has had no headache.  She has had no nausea or vomiting.  There are no rashes.  She has had no bleeding or bruising.  PHYSICAL EXAMINATION:  This is a well-developed, well-nourished black female in no obvious distress.  Vital signs:  98, pulse 75, respiratory rate 16, blood pressure 139/89.  Weight is 161.  Head and neck: Normocephalic, atraumatic skull.  There are no ocular or oral lesions. There are no palpable cervical or supraclavicular lymph nodes.  Lungs: Clear bilaterally.  Cardiac:  Regular rate and rhythm with a normal S1 and S2.  There are no murmurs, rubs or bruits.  Abdomen:  Soft with good bowel sounds.  There is no palpable abdominal mass.  There is no fluid wave.  There is no palpable hepatosplenomegaly.  Extremities:  No clubbing, cyanosis or edema.  Neurologic:  No focal neurological deficits.  LABORATORY STUDIES:  White cell count is 5.5, hemoglobin 10.3, hematocrit 31, platelet count 136.  IMPRESSION:  Maureen Duncan is a 61 year old African American female with stage IIB infiltrating ductal carcinoma of the right breast.  She is doing well with adjuvant chemotherapy.  She had 3 positive lymph nodes.  We will go ahead and proceed with her 4th cycle of chemo today.  I plan on 6 total cycles.  We will have her come back in 3 weeks for her 5th cycle.  She will see French Ana when she gets back.    ______________________________ Josph Macho, M.D. PRE/MEDQ  D:  05/08/2012  T:  05/08/2012   Job:  [id number]

## 2012-05-08 NOTE — Patient Instructions (Addendum)
 [facility] Discharge Instructions for Patients Receiving Chemotherapy  Today you received the following chemotherapy agents Carboplatin, taxotere To help prevent nausea and vomiting after your treatment, we encourage you to take your nausea medication   If you develop nausea and vomiting that is not controlled by your nausea medication, call the clinic. If it is after clinic hours your family physician or the after hours number for the clinic or go to the Emergency Department.   BELOW ARE SYMPTOMS THAT SHOULD BE REPORTED IMMEDIATELY:  *FEVER GREATER THAN 100.5 F  *CHILLS WITH OR WITHOUT FEVER  NAUSEA AND VOMITING THAT IS NOT CONTROLLED WITH YOUR NAUSEA MEDICATION  *UNUSUAL SHORTNESS OF BREATH  *UNUSUAL BRUISING OR BLEEDING  TENDERNESS IN MOUTH AND THROAT WITH OR WITHOUT PRESENCE OF ULCERS  *URINARY PROBLEMS  *BOWEL PROBLEMS  UNUSUAL RASH Items with * indicate a potential emergency and should be followed up as soon as possible.  One of the nurses will contact you 24 hours after your treatment. Please let the nurse know about any problems that you may have experienced. Feel free to call the clinic you have any questions or concerns. The clinic phone number is [phone].   I have been informed and understand all the instructions given to me. I know to contact the clinic, my physician, or go to the Emergency Department if any problems should occur. I do not have any questions at this time, but understand that I may call the clinic during office hours   should I have any questions or need assistance in obtaining follow up care.    __________________________________________  _____________  __________ Signature of Patient or Authorized Representative            Date                   Time    __________________________________________ Nurse's Signature

## 2012-05-09 ENCOUNTER — Ambulatory Visit (HOSPITAL_BASED_OUTPATIENT_CLINIC_OR_DEPARTMENT_OTHER): Payer: Federal, State, Local not specified - PPO

## 2012-05-09 VITALS — BP 138/86 | HR 78 | Temp 98.7°F

## 2012-05-09 DIAGNOSIS — Z5189 Encounter for other specified aftercare: Secondary | ICD-10-CM

## 2012-05-09 DIAGNOSIS — C50419 Malignant neoplasm of upper-outer quadrant of unspecified female breast: Secondary | ICD-10-CM

## 2012-05-09 DIAGNOSIS — C50919 Malignant neoplasm of unspecified site of unspecified female breast: Secondary | ICD-10-CM

## 2012-05-09 MED ORDER — PEGFILGRASTIM INJECTION 6 MG/0.6ML
6.0000 mg | Freq: Once | SUBCUTANEOUS | Status: AC
  Administered 2012-05-09: 6 mg via SUBCUTANEOUS

## 2012-05-20 ENCOUNTER — Telehealth: Payer: Self-pay | Admitting: *Deleted

## 2012-05-20 MED ORDER — LORATADINE 10 MG PO TABS: 10.0000 mg | ORAL_TABLET | Freq: Every day | ORAL | Status: DC

## 2012-05-20 NOTE — Telephone Encounter (Signed)
Pt called stating she felt like she was coming down with something - possibly sinus and requested a call back. Spoke to pt, she stated that she has a runny nose; denies a fever. Reviewed with Dr. Myna Hidalgo. To resume taking her Claritin. If her sx don't improve, worsen or develops a fever, she understands to call the office.

## 2012-05-22 ENCOUNTER — Other Ambulatory Visit: Payer: Self-pay | Admitting: *Deleted

## 2012-05-22 DIAGNOSIS — R05 Cough: Secondary | ICD-10-CM

## 2012-05-22 DIAGNOSIS — R059 Cough, unspecified: Secondary | ICD-10-CM

## 2012-05-22 MED ORDER — BENZONATATE 100 MG PO CAPS: 100.0000 mg | ORAL_CAPSULE | Freq: Three times a day (TID) | ORAL | Status: AC | PRN

## 2012-05-22 NOTE — Telephone Encounter (Signed)
Pt left message on vm this morning stating she has a "dry, hard cough" and wants to know what kind of cough syrup she can take or if something could be called in for her. Reviewed with Dr Myna Hidalgo. To try Tessalon Pearls 100 mg q8h prn. Returned call, no answer. Left message asking her to return call but in the meantime, a rx was e-prescribed to her pharmacy on file.

## 2012-05-26 ENCOUNTER — Telehealth (INDEPENDENT_AMBULATORY_CARE_PROVIDER_SITE_OTHER): Payer: Self-pay | Admitting: General Surgery

## 2012-05-26 NOTE — Telephone Encounter (Signed)
Pt called asking to see if her PAC would cause her arm to be tight and tired.  Explained to the patient that this can happen however she explained that her right hand has been swollen for a couple weeks and she is s/p right mastectomy. I told her I would talk with Dr. Allene Pyo nurse and ask our urgent office doctor and get back with her at [phone].

## 2012-05-28 ENCOUNTER — Ambulatory Visit (INDEPENDENT_AMBULATORY_CARE_PROVIDER_SITE_OTHER): Payer: Federal, State, Local not specified - PPO | Admitting: Surgery

## 2012-05-28 ENCOUNTER — Encounter (INDEPENDENT_AMBULATORY_CARE_PROVIDER_SITE_OTHER): Payer: Self-pay | Admitting: Surgery

## 2012-05-28 VITALS — BP 122/88 | HR 72 | Temp 98.4°F | Resp 16 | Ht 65.0 in | Wt 159.0 lb

## 2012-05-28 DIAGNOSIS — C50919 Malignant neoplasm of unspecified site of unspecified female breast: Secondary | ICD-10-CM

## 2012-05-28 NOTE — Progress Notes (Addendum)
Re:   Maureen Duncan DOB:   September 08, 1951 MRN:   [id number]  ASSESSMENT AND PLAN: 1.  Right breast cancer, T2, N1.  Right MRM - 01/30/2012.  Final path - 3.8 cm ca, 3/13 nodes positive, extracapsular tumor extension.  Final path - Grade 3, IDC.  ER/PR - negative. Her2Neu - 5.50 (positive).  Ki67 - 95%.  To finish chemotx 06/19/2012.  Treating oncologist - P. Ennever.  I will see me back in 3 months. (this is 6 months from her last regular visit)  I wrote her a prescription for a breast prosthesis.  [Spoke with Dr. Myna Hidalgo.  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  She is on Arixtra.  He plans to treat her Monday, 8/26 with me removing the power port on Tues., 8/27.  DN  06/19/2012]  2.  History of right breast cancer - January 2001.  1.5 cm cancer, ER/PR positive, Her2Neu - neg.  Had lumpectomy and SLNBx - Dr. Samuella Cota - 2001.  Saw P. Ennever and J. Wu.  3.  Power port placed 01/30/2012. 4.  Genetic testing - I can't speak to all the details, but she decided to cancel genetic testing.  (See letter in files) 5.  Lymphedema involving right hand - reason for today's visit (05/28/2012)  I will refer her to the lymphedema clinic.  REFERRING PHYSICIAN: Meriel Pica, MD  HISTORY OF PRESENT ILLNESS: Maureen Duncan is a 61 y.o. (DOB: 1950-12-29)  AA female whose primary care physician is Meriel Pica, MD and comes for follow up of right mastectomy and right hand swelling.  She has noticed some mild swelling of her right hand over the last 2 or 3 weeks.  It does not hurt, she has no limit in function, and she has had no fever.  She is actively being treated by Dr. Myna Hidalgo.   Past Medical History  Diagnosis Date  . Breast cancer 2000; 01/30/12    right   Current Outpatient Prescriptions  Medication Sig Dispense Refill  . Alum & Mag Hydroxide-Simeth (MAGIC MOUTHWASH W/LIDOCAINE) SOLN Take 5 mLs by mouth 4 (four) times daily as needed. Swish and Spit.  240 mL  1  . benzonatate  (TESSALON) 100 MG capsule Take 1 capsule (100 mg total) by mouth 3 (three) times daily as needed for cough.  20 capsule  0  . dexamethasone (DECADRON) 4 MG tablet as needed. Take for 4 days after chemo      . diphenhydrAMINE (BENADRYL) 25 MG tablet Take 25 mg by mouth every 6 (six) hours as needed.      . loratadine (CLARITIN) 10 MG tablet Take 1 tablet (10 mg total) by mouth daily.  30 tablet  11  . LORazepam (ATIVAN) 0.5 MG tablet 0.5 mg every 6 (six) hours as needed.       . ondansetron (ZOFRAN) 8 MG tablet every 8 (eight) hours as needed.       . traMADol (ULTRAM) 50 MG tablet every 8 (eight) hours as needed.       Marland Kitchen DISCONTD: calcium-vitamin D (OSCAL) 250-125 MG-UNIT per tablet Take 1 tablet by mouth daily.          No Known Allergies  REVIEW OF SYSTEMS: Negative except the breast cancer.  SOCIAL and FAMILY HISTORY: Single. Has two daughters- 73 and 74.  PHYSICAL EXAM: BP 122/88  Pulse 72  Temp 98.4 F (36.9 C) (Oral)  Resp 16  Ht 5\' 5"  (1.651 m)  Wt 159 lb (72.122  kg)  BMI 26.46 kg/m2  General: WN AA F who is alert and generally healthy appearing.  HEENT:  Pupils equal. NECK:  Supple.  No thyroid mass. LYMPH NODES:  No cervical, supraclavicular, or axillary adenopathy. BREASTS -  RIGHT:  Right breast absent.  The mastectomy wound has healed and the skin is softening up.  No nodularity or mass.   LEFT:  No palpable mass or nodule.  No nipple discharge.  Left subclavian power port okay. UPPER EXTREMITIES:  Right hand mildly swollen.  DATA REVIEWED: Reviewed Dr. Gustavo Lah note.  Ovidio Kin, MD,  [facility] [address] [place]  [address]   [place], [place]    [address] Phone:  [phone] FAX:  [phone]

## 2012-05-29 ENCOUNTER — Ambulatory Visit (HOSPITAL_BASED_OUTPATIENT_CLINIC_OR_DEPARTMENT_OTHER): Payer: Federal, State, Local not specified - PPO

## 2012-05-29 ENCOUNTER — Other Ambulatory Visit: Payer: Federal, State, Local not specified - PPO | Admitting: Lab

## 2012-05-29 ENCOUNTER — Other Ambulatory Visit (HOSPITAL_BASED_OUTPATIENT_CLINIC_OR_DEPARTMENT_OTHER): Payer: Federal, State, Local not specified - PPO | Admitting: Lab

## 2012-05-29 ENCOUNTER — Encounter (INDEPENDENT_AMBULATORY_CARE_PROVIDER_SITE_OTHER): Payer: Federal, State, Local not specified - PPO | Admitting: Surgery

## 2012-05-29 ENCOUNTER — Ambulatory Visit: Payer: Federal, State, Local not specified - PPO

## 2012-05-29 ENCOUNTER — Ambulatory Visit: Payer: Federal, State, Local not specified - PPO | Admitting: Hematology & Oncology

## 2012-05-29 ENCOUNTER — Ambulatory Visit (HOSPITAL_BASED_OUTPATIENT_CLINIC_OR_DEPARTMENT_OTHER): Payer: Federal, State, Local not specified - PPO | Admitting: Medical

## 2012-05-29 DIAGNOSIS — C50919 Malignant neoplasm of unspecified site of unspecified female breast: Secondary | ICD-10-CM

## 2012-05-29 DIAGNOSIS — R599 Enlarged lymph nodes, unspecified: Secondary | ICD-10-CM

## 2012-05-29 DIAGNOSIS — C773 Secondary and unspecified malignant neoplasm of axilla and upper limb lymph nodes: Secondary | ICD-10-CM

## 2012-05-29 DIAGNOSIS — Z5112 Encounter for antineoplastic immunotherapy: Secondary | ICD-10-CM

## 2012-05-29 DIAGNOSIS — Z5111 Encounter for antineoplastic chemotherapy: Secondary | ICD-10-CM

## 2012-05-29 LAB — CBC WITH DIFFERENTIAL (CANCER CENTER ONLY)
BASO#: 0 10*3/uL (ref 0.0–0.2)
EOS%: 0.2 % (ref 0.0–7.0)
Eosinophils Absolute: 0 10*3/uL (ref 0.0–0.5)
HGB: 9.2 g/dL — ABNORMAL LOW (ref 11.6–15.9)
LYMPH%: 35 % (ref 14.0–48.0)
MCH: 34.1 pg — ABNORMAL HIGH (ref 26.0–34.0)
MCHC: 33.8 g/dL (ref 32.0–36.0)
MCV: 101 fL (ref 81–101)
MONO%: 13.9 % — ABNORMAL HIGH (ref 0.0–13.0)
NEUT#: 2.4 10*3/uL (ref 1.5–6.5)
NEUT%: 50.7 % (ref 39.6–80.0)
RBC: 2.7 10*6/uL — ABNORMAL LOW (ref 3.70–5.32)

## 2012-05-29 LAB — COMPREHENSIVE METABOLIC PANEL
AST: 20 U/L (ref 0–37)
Albumin: 3.6 g/dL (ref 3.5–5.2)
Alkaline Phosphatase: 62 U/L (ref 39–117)
BUN: 21 mg/dL (ref 6–23)
Creatinine, Ser: 0.8 mg/dL (ref 0.50–1.10)
Glucose, Bld: 87 mg/dL (ref 70–99)
Total Bilirubin: 0.4 mg/dL (ref 0.3–1.2)

## 2012-05-29 MED ORDER — DOCETAXEL CHEMO INJECTION 160 MG/16ML
75.0000 mg/m2 | Freq: Once | INTRAVENOUS | Status: AC
  Administered 2012-05-29: 140 mg via INTRAVENOUS
  Filled 2012-05-29: qty 14

## 2012-05-29 MED ORDER — DIPHENHYDRAMINE HCL 25 MG PO CAPS
50.0000 mg | ORAL_CAPSULE | Freq: Once | ORAL | Status: AC
  Administered 2012-05-29: 50 mg via ORAL

## 2012-05-29 MED ORDER — SODIUM CHLORIDE 0.9 % IJ SOLN
10.0000 mL | INTRAMUSCULAR | Status: DC | PRN
  Administered 2012-05-29: 10 mL
  Filled 2012-05-29: qty 10

## 2012-05-29 MED ORDER — HEPARIN SOD (PORK) LOCK FLUSH 100 UNIT/ML IV SOLN
500.0000 [IU] | Freq: Once | INTRAVENOUS | Status: AC | PRN
  Administered 2012-05-29: 500 [IU]
  Filled 2012-05-29: qty 5

## 2012-05-29 MED ORDER — TRASTUZUMAB CHEMO INJECTION 440 MG
6.0000 mg/kg | Freq: Once | INTRAVENOUS | Status: AC
  Administered 2012-05-29: 420 mg via INTRAVENOUS
  Filled 2012-05-29: qty 20

## 2012-05-29 MED ORDER — ACETAMINOPHEN 325 MG PO TABS
650.0000 mg | ORAL_TABLET | Freq: Once | ORAL | Status: AC
  Administered 2012-05-29: 650 mg via ORAL

## 2012-05-29 MED ORDER — DEXAMETHASONE SODIUM PHOSPHATE 4 MG/ML IJ SOLN
20.0000 mg | Freq: Once | INTRAMUSCULAR | Status: AC
  Administered 2012-05-29: 20 mg via INTRAVENOUS

## 2012-05-29 MED ORDER — SODIUM CHLORIDE 0.9 % IV SOLN
828.0000 mg | Freq: Once | INTRAVENOUS | Status: AC
  Administered 2012-05-29: 830 mg via INTRAVENOUS
  Filled 2012-05-29: qty 83

## 2012-05-29 MED ORDER — SODIUM CHLORIDE 0.9 % IV SOLN
Freq: Once | INTRAVENOUS | Status: AC
  Administered 2012-05-29: 10:00:00 via INTRAVENOUS

## 2012-05-29 MED ORDER — SODIUM CHLORIDE 0.9 % IV SOLN: Freq: Once | INTRAVENOUS | Status: DC

## 2012-05-29 MED ORDER — ONDANSETRON 16 MG/50ML IVPB (CHCC)
16.0000 mg | Freq: Once | INTRAVENOUS | Status: AC
  Administered 2012-05-29: 16 mg via INTRAVENOUS

## 2012-05-29 NOTE — Progress Notes (Signed)
Patient Name : Maureen Duncan, Maureen Duncan MR #[id number] DOB: 1951-05-31 Encounter Date: 05/29/2012 Dictated by Eunice Blase, PA-C  Diagnosis: #1 Stage IIb (T2, N1, M0) ductal carcinoma of the right breast (estrogen receptor/progesterone receptor-negative/HER.-2.-positive). #2 lymphedema involving the right hand.  Current therapy: The patient is status post 4 cycles of TCH.  Interim history: Maureen Duncan presents today for an office followup visit.  She is status post 4 cycles of TCH.  Overall, she, reports, that she is doing relatively well.  She does have a little bit of fatigue, which is normal.  She has a good appetite.  She denies any diarrhea, constipation, nausea, vomiting, cough, chest pain, shortness of breath.  She denies any fever.  She denies any type of mucositis, any headaches or visual changes.  She denies any rashes or obvious, bleeding.  She does report some very mild, transient neuropathy of the left fingertips, however, this does not hinder her from her activities of daily living.  She was recently seen by Dr. Ezzard Standing for some right hand lymphedema.  Dr. Ezzard Standing referred her to the lymphedema clinic.  Of note, she did have 3 positive lymph nodes.  Review of Systems :mild, transient neuropathy of the left fingertips, right hand , lymphadenopathy, and fatigue, otherwise: Pt. Denies any changes in their vision, hearing, adenopathy, fevers, chills, nausea, vomiting, diarrhea, constipation, chest pain, shortness of breath, passing blood, passing out, blacking out,  any changes in skin, joints, neurologic or psychiatric except as noted.  Physical Exam: This is a very pleasant, well-developed, well-nourished, 61 year old, African American, female, in no obvious distress Vitals: Temperature 97.1 degrees, pulse 85, respirations 16, blood pressure 122/77, weight 159 pounds HEENT reveals a normocephalic, atraumatic skull, no scleral icterus, no oral lesions  Neck is supple without any cervical or  supraclavicular adenopathy.  Lungs are clear to auscultation bilaterally. There are no wheezes, rales or rhonci Cardiac is regular rate and rhythm with a normal S1 and S2. There are no murmurs, rubs, or bruits.  Abdomen is soft with good bowel sounds there's a palpable mass. There is no palpable hepatosplenomegaly. There is no palpable fluid wave.  Musculoskeletal no tenderness of the spine, ribs, or hips.  Extremities there are no clubbing, cyanosis, or edema. + for right hand swelling Skin no petechia, purpura or ecchymosis Neurologic is nonfocal. Breast-R breast-s/p mastectomy, no nodularity or mass             L breast-no palpable mass or nodule.  No nipple discharge. Left subclavian port-a-cath  Laboratory Data: White count 4.7, hemoglobin 9.2, hematocrit 27.2, platelets 130,000  Current Outpatient Prescriptions on File Prior to Visit  Medication Sig Dispense Refill  . Alum & Mag Hydroxide-Simeth (MAGIC MOUTHWASH W/LIDOCAINE) SOLN Take 5 mLs by mouth 4 (four) times daily as needed. Swish and Spit.  240 mL  1  . benzonatate (TESSALON) 100 MG capsule Take 1 capsule (100 mg total) by mouth 3 (three) times daily as needed for cough.  20 capsule  0  . dexamethasone (DECADRON) 4 MG tablet as needed. Take for 4 days after chemo      . diphenhydrAMINE (BENADRYL) 25 MG tablet Take 25 mg by mouth every 6 (six) hours as needed.      . loratadine (CLARITIN) 10 MG tablet Take 1 tablet (10 mg total) by mouth daily.  30 tablet  11  . LORazepam (ATIVAN) 0.5 MG tablet 0.5 mg every 6 (six) hours as needed.       . ondansetron (ZOFRAN) 8  MG tablet every 8 (eight) hours as needed.       . traMADol (ULTRAM) 50 MG tablet every 8 (eight) hours as needed.       Marland Kitchen DISCONTD: calcium-vitamin D (OSCAL) 250-125 MG-UNIT per tablet Take 1 tablet by mouth daily.         Assessment/Plan: This is a pleasant, 61 year old, African American, female, with the following issues: #1 stage IIB infiltrating ductal carcinoma of  the right breast.  She is receiving adjuvant chemotherapy.  She is status post a right mastectomy.  She had 3 positive lymph nodes.  She will go ahead and proceed with her fifth cycle of TCH today.  The plan is to complete a total of 6 cycles.  #2, right hand, lymphadenopathy-she was seen by Dr. Ezzard Standing for this who has referred her to the lymphedema clinic.  #3 followup-Maureen Duncan will follow back up with [place] in 3 weeks for her sixth and final cycle of chemotherapy.  We will see her back before then should she have questions or concerns.

## 2012-05-29 NOTE — Patient Instructions (Signed)
[facility] Discharge Instructions for Patients Receiving Chemotherapy  Today you received the following chemotherapy agents Carboplatin, taxotere To help prevent nausea and vomiting after your treatment, we encourage you to take your nausea medication   If you develop nausea and vomiting that is not controlled by your nausea medication, call the clinic. If it is after clinic hours your family physician or the after hours number for the clinic or go to the Emergency Department.   BELOW ARE SYMPTOMS THAT SHOULD BE REPORTED IMMEDIATELY:  *FEVER GREATER THAN 100.5 F  *CHILLS WITH OR WITHOUT FEVER  NAUSEA AND VOMITING THAT IS NOT CONTROLLED WITH YOUR NAUSEA MEDICATION  *UNUSUAL SHORTNESS OF BREATH  *UNUSUAL BRUISING OR BLEEDING  TENDERNESS IN MOUTH AND THROAT WITH OR WITHOUT PRESENCE OF ULCERS  *URINARY PROBLEMS  *BOWEL PROBLEMS  UNUSUAL RASH Items with * indicate a potential emergency and should be followed up as soon as possible.  One of the nurses will contact you 24 hours after your treatment. Please let the nurse know about any problems that you may have experienced. Feel free to call the clinic you have any questions or concerns. The clinic phone number is [phone].   I have been informed and understand all the instructions given to me. I know to contact the clinic, my physician, or go to the Emergency Department if any problems should occur. I do not have any questions at this time, but understand that I may call the clinic during office hours   should I have any questions or need assistance in obtaining follow up care.    __________________________________________  _____________  __________ Signature of Patient or Authorized Representative            Date                   Time    __________________________________________ Nurse's Signature      [facility] Discharge Instructions for Patients Receiving Chemotherapy  Today you  received the following chemotherapy agents Carboplatin, Taxotere, Herceptin  To help prevent nausea and vomiting after your treatment, we encourage you to take your nausea medication:  Zofran 8mg: 1 tab twice daily for 4 days starting the day after chemo then twice daily as needed   Decadron 4mg: 2 tabs twice daily for 4 days starting the day after chemo  Ativan 1mg: 1 tab every 8 hours as needed for nausea, vomiting, sleep, or anxiety (may also dissolve under tongue).  If you develop nausea and vomiting that is not controlled by your nausea medication, call the clinic. If it is after clinic hours your family physician or the after hours number for the clinic or go to the Emergency Department.   BELOW ARE SYMPTOMS THAT SHOULD BE REPORTED IMMEDIATELY:  *FEVER GREATER THAN 100.5 F  *CHILLS WITH OR WITHOUT FEVER  NAUSEA AND VOMITING THAT IS NOT CONTROLLED WITH YOUR NAUSEA MEDICATION  *UNUSUAL SHORTNESS OF BREATH  *UNUSUAL BRUISING OR BLEEDING  TENDERNESS IN MOUTH AND THROAT WITH OR WITHOUT PRESENCE OF ULCERS  *URINARY PROBLEMS  *BOWEL PROBLEMS  UNUSUAL RASH Items with * indicate a potential emergency and should be followed up as soon as possible.  One of the nurses will contact you 24 hours after your treatment. Please let the nurse know about any problems that you may have experienced. Feel free to call the clinic you have any questions or concerns. The clinic phone number is [phone].   I have been informed and   understand all the instructions given to me. I know to contact the clinic, my physician, or go to the Emergency Department if any problems should occur. I do not have any questions at this time, but understand that I may call the clinic during office hours   should I have any questions or need assistance in obtaining follow up care.    __________________________________________  _____________  __________ Signature of Patient or Authorized Representative             Date                   Time    __________________________________________ Nurse's Signature     Carboplatin injection What is this medicine? CARBOPLATIN (KAR boe pla tin) is a chemotherapy drug. It targets fast dividing cells, like cancer cells, and causes these cells to die. This medicine is used to treat ovarian cancer and many other cancers. This medicine may be used for other purposes; ask your health care provider or pharmacist if you have questions. What should I tell my health care provider before I take this medicine? They need to know if you have any of these conditions: -blood disorders -hearing problems -kidney disease -recent or ongoing radiation therapy -an unusual or allergic reaction to carboplatin, cisplatin, other chemotherapy, other medicines, foods, dyes, or preservatives -pregnant or trying to get pregnant -breast-feeding How should I use this medicine? This drug is usually given as an infusion into a vein. It is administered in a hospital or clinic by a specially trained health care professional. Talk to your pediatrician regarding the use of this medicine in children. Special care may be needed. Overdosage: If you think you have taken too much of this medicine contact a poison control center or emergency room at once. NOTE: This medicine is only for you. Do not share this medicine with others. What if I miss a dose? It is important not to miss a dose. Call your doctor or health care professional if you are unable to keep an appointment. What may interact with this medicine? -medicines for seizures -medicines to increase blood counts like filgrastim, pegfilgrastim, sargramostim -some antibiotics like amikacin, gentamicin, neomycin, streptomycin, tobramycin -vaccines Talk to your doctor or health care professional before taking any of these medicines: -acetaminophen -aspirin -ibuprofen -ketoprofen -naproxen This list may not describe all possible  interactions. Give your health care provider a list of all the medicines, herbs, non-prescription drugs, or dietary supplements you use. Also tell them if you smoke, drink alcohol, or use illegal drugs. Some items may interact with your medicine. What should I watch for while using this medicine? Your condition will be monitored carefully while you are receiving this medicine. You will need important blood work done while you are taking this medicine. This drug may make you feel generally unwell. This is not uncommon, as chemotherapy can affect healthy cells as well as cancer cells. Report any side effects. Continue your course of treatment even though you feel ill unless your doctor tells you to stop. In some cases, you may be given additional medicines to help with side effects. Follow all directions for their use. Call your doctor or health care professional for advice if you get a fever, chills or sore throat, or other symptoms of a cold or flu. Do not treat yourself. This drug decreases your body's ability to fight infections. Try to avoid being around people who are sick. This medicine may increase your risk to bruise or bleed. Call your   doctor or health care professional if you notice any unusual bleeding. Be careful brushing and flossing your teeth or using a toothpick because you may get an infection or bleed more easily. If you have any dental work done, tell your dentist you are receiving this medicine. Avoid taking products that contain aspirin, acetaminophen, ibuprofen, naproxen, or ketoprofen unless instructed by your doctor. These medicines may hide a fever. Do not become pregnant while taking this medicine. Women should inform their doctor if they wish to become pregnant or think they might be pregnant. There is a potential for serious side effects to an unborn child. Talk to your health care professional or pharmacist for more information. Do not breast-feed an infant while taking this  medicine. What side effects may I notice from receiving this medicine? Side effects that you should report to your doctor or health care professional as soon as possible: -allergic reactions like skin rash, itching or hives, swelling of the face, lips, or tongue -signs of infection - fever or chills, cough, sore throat, pain or difficulty passing urine -signs of decreased platelets or bleeding - bruising, pinpoint red spots on the skin, black, tarry stools, nosebleeds -signs of decreased red blood cells - unusually weak or tired, fainting spells, lightheadedness -breathing problems -changes in hearing -changes in vision -chest pain -high blood pressure -low blood counts - This drug may decrease the number of white blood cells, red blood cells and platelets. You may be at increased risk for infections and bleeding. -nausea and vomiting -pain, swelling, redness or irritation at the injection site -pain, tingling, numbness in the hands or feet -problems with balance, talking, walking -trouble passing urine or change in the amount of urine Side effects that usually do not require medical attention (report to your doctor or health care professional if they continue or are bothersome): -hair loss -loss of appetite -metallic taste in the mouth or changes in taste This list may not describe all possible side effects. Call your doctor for medical advice about side effects. You may report side effects to FDA at 1-800-FDA-1088. Where should I keep my medicine? This drug is given in a hospital or clinic and will not be stored at home. NOTE: This sheet is a summary. It may not cover all possible information. If you have questions about this medicine, talk to your doctor, pharmacist, or health care provider.  2012, [employer]. (01/19/2008 2:38:05 PM)   Trastuzumab injection for infusion  What is this medicine? TRASTUZUMAB (tras TOO zoo mab) is a monoclonal antibody. It targets a protein  called HER2. This protein is found in some stomach and breast cancers. This medicine can stop cancer cell growth. This medicine may be used with other cancer treatments. This medicine may be used for other purposes; ask your health care provider or pharmacist if you have questions. What should I tell my health care provider before I take this medicine? They need to know if you have any of these conditions: -heart disease -heart failure -infection (especially a virus infection such as chickenpox, cold sores, or herpes) -lung or breathing disease, like asthma -recent or ongoing radiation therapy -an unusual or allergic reaction to trastuzumab, benzyl alcohol, or other medications, foods, dyes, or preservatives -pregnant or trying to get pregnant -breast-feeding How should I use this medicine? This drug is given as an infusion into a vein. It is administered in a hospital or clinic by a specially trained health care professional. Talk to your pediatrician regarding the use of   this medicine in children. This medicine is not approved for use in children. Overdosage: If you think you have taken too much of this medicine contact a poison control center or emergency room at once. NOTE: This medicine is only for you. Do not share this medicine with others. What if I miss a dose? It is important not to miss a dose. Call your doctor or health care professional if you are unable to keep an appointment. What may interact with this medicine? -cyclophosphamide -doxorubicin -warfarin This list may not describe all possible interactions. Give your health care provider a list of all the medicines, herbs, non-prescription drugs, or dietary supplements you use. Also tell them if you smoke, drink alcohol, or use illegal drugs. Some items may interact with your medicine. What should I watch for while using this medicine? Visit your doctor for checks on your progress. Report any side effects. Continue your course  of treatment even though you feel ill unless your doctor tells you to stop. Call your doctor or health care professional for advice if you get a fever, chills or sore throat, or other symptoms of a cold or flu. Do not treat yourself. Try to avoid being around people who are sick. You may experience fever, chills and shaking during your first infusion. These effects are usually mild and can be treated with other medicines. Report any side effects during the infusion to your health care professional. Fever and chills usually do not happen with later infusions. What side effects may I notice from receiving this medicine? Side effects that you should report to your doctor or other health care professional as soon as possible: -breathing difficulties -chest pain or palpitations -cough -dizziness or fainting -fever or chills, sore throat -skin rash, itching or hives -swelling of the legs or ankles -unusually weak or tired Side effects that usually do not require medical attention (report to your doctor or other health care professional if they continue or are bothersome): -loss of appetite -headache -muscle aches -nausea This list may not describe all possible side effects. Call your doctor for medical advice about side effects. You may report side effects to FDA at 1-800-FDA-1088. Where should I keep my medicine? This drug is given in a hospital or clinic and will not be stored at home. NOTE: This sheet is a summary. It may not cover all possible information. If you have questions about this medicine, talk to your doctor, pharmacist, or health care provider.  2012, [employer]/Gold Standard. (08/18/2009 1:43:15 PM)   Docetaxel injection  What is this medicine? DOCETAXEL (doe se TAX el) is a chemotherapy drug. It targets fast dividing cells, like cancer cells, and causes these cells to die. This medicine is used to treat many types of cancers like breast cancer, certain stomach cancers, head and  neck cancer, lung cancer, and prostate cancer. This medicine may be used for other purposes; ask your health care provider or pharmacist if you have questions. What should I tell my health care provider before I take this medicine? They need to know if you have any of these conditions: -infection (especially a virus infection such as chickenpox, cold sores, or herpes) -liver disease -low blood counts, like low white cell, platelet, or red cell counts -an unusual or allergic reaction to docetaxel, polysorbate 80, other chemotherapy agents, other medicines, foods, dyes, or preservatives -pregnant or trying to get pregnant -breast-feeding How should I use this medicine? This drug is given as an infusion into a vein. It is   administered in a hospital or clinic by a specially trained health care professional. Talk to your pediatrician regarding the use of this medicine in children. Special care may be needed. Overdosage: If you think you have taken too much of this medicine contact a poison control center or emergency room at once. NOTE: This medicine is only for you. Do not share this medicine with others. What if I miss a dose? It is important not to miss your dose. Call your doctor or health care professional if you are unable to keep an appointment. What may interact with this medicine? -cyclosporine -erythromycin -ketoconazole -medicines to increase blood counts like filgrastim, pegfilgrastim, sargramostim -vaccines Talk to your doctor or health care professional before taking any of these medicines: -acetaminophen -aspirin -ibuprofen -ketoprofen -naproxen This list may not describe all possible interactions. Give your health care provider a list of all the medicines, herbs, non-prescription drugs, or dietary supplements you use. Also tell them if you smoke, drink alcohol, or use illegal drugs. Some items may interact with your medicine. What should I watch for while using this  medicine? Your condition will be monitored carefully while you are receiving this medicine. You will need important blood work done while you are taking this medicine. This drug may make you feel generally unwell. This is not uncommon, as chemotherapy can affect healthy cells as well as cancer cells. Report any side effects. Continue your course of treatment even though you feel ill unless your doctor tells you to stop. In some cases, you may be given additional medicines to help with side effects. Follow all directions for their use. Call your doctor or health care professional for advice if you get a fever, chills or sore throat, or other symptoms of a cold or flu. Do not treat yourself. This drug decreases your body's ability to fight infections. Try to avoid being around people who are sick. This medicine may increase your risk to bruise or bleed. Call your doctor or health care professional if you notice any unusual bleeding. Be careful brushing and flossing your teeth or using a toothpick because you may get an infection or bleed more easily. If you have any dental work done, tell your dentist you are receiving this medicine. Avoid taking products that contain aspirin, acetaminophen, ibuprofen, naproxen, or ketoprofen unless instructed by your doctor. These medicines may hide a fever. Do not become pregnant while taking this medicine. Women should inform their doctor if they wish to become pregnant or think they might be pregnant. There is a potential for serious side effects to an unborn child. Talk to your health care professional or pharmacist for more information. Do not breast-feed an infant while taking this medicine. What side effects may I notice from receiving this medicine? Side effects that you should report to your doctor or health care professional as soon as possible: -allergic reactions like skin rash, itching or hives, swelling of the face, lips, or tongue -low blood counts - This  drug may decrease the number of white blood cells, red blood cells and platelets. You may be at increased risk for infections and bleeding. -signs of infection - fever or chills, cough, sore throat, pain or difficulty passing urine -signs of decreased platelets or bleeding - bruising, pinpoint red spots on the skin, black, tarry stools, nosebleeds -signs of decreased red blood cells - unusually weak or tired, fainting spells, lightheadedness -breathing problems -fast or irregular heartbeat -low blood pressure -mouth sores -nausea and vomiting -pain, swelling, redness   or irritation at the injection site -pain, tingling, numbness in the hands or feet -swelling of the ankle, feet, hands -weight gain Side effects that usually do not require medical attention (report to your prescriber or health care professional if they continue or are bothersome): -bone pain -complete hair loss including hair on your head, underarms, pubic hair, eyebrows, and eyelashes -diarrhea -excessive tearing -changes in the color of fingernails -loosening of the fingernails -nausea -muscle pain -red flush to skin -sweating -weak or tired This list may not describe all possible side effects. Call your doctor for medical advice about side effects. You may report side effects to FDA at 1-800-FDA-1088. Where should I keep my medicine? This drug is given in a hospital or clinic and will not be stored at home. NOTE: This sheet is a summary. It may not cover all possible information. If you have questions about this medicine, talk to your doctor, pharmacist, or health care provider.  2012, [employer] Standard. (09/26/2008 11:52:10 AM)    

## 2012-05-30 ENCOUNTER — Ambulatory Visit (HOSPITAL_BASED_OUTPATIENT_CLINIC_OR_DEPARTMENT_OTHER): Payer: Federal, State, Local not specified - PPO

## 2012-05-30 VITALS — BP 131/80 | HR 91 | Temp 97.8°F | Resp 18

## 2012-05-30 DIAGNOSIS — C773 Secondary and unspecified malignant neoplasm of axilla and upper limb lymph nodes: Secondary | ICD-10-CM

## 2012-05-30 DIAGNOSIS — Z5189 Encounter for other specified aftercare: Secondary | ICD-10-CM

## 2012-05-30 DIAGNOSIS — C50919 Malignant neoplasm of unspecified site of unspecified female breast: Secondary | ICD-10-CM

## 2012-05-30 MED ORDER — PEGFILGRASTIM INJECTION 6 MG/0.6ML
6.0000 mg | Freq: Once | SUBCUTANEOUS | Status: AC
  Administered 2012-05-30: 6 mg via SUBCUTANEOUS

## 2012-06-03 ENCOUNTER — Ambulatory Visit: Payer: Federal, State, Local not specified - PPO | Attending: Surgery | Admitting: Physical Therapy

## 2012-06-03 DIAGNOSIS — IMO0001 Reserved for inherently not codable concepts without codable children: Secondary | ICD-10-CM | POA: Insufficient documentation

## 2012-06-03 DIAGNOSIS — I890 Lymphedema, not elsewhere classified: Secondary | ICD-10-CM | POA: Insufficient documentation

## 2012-06-16 ENCOUNTER — Telehealth (INDEPENDENT_AMBULATORY_CARE_PROVIDER_SITE_OTHER): Payer: Self-pay

## 2012-06-16 NOTE — Telephone Encounter (Signed)
The patient called in complaining of left arm and hand swelling.  She had surgery on her right side.  She recently went to Lymphedema clinic for the right arm swelling.  She has a portacath in place.  The left hand and arm are not bothering her but they are significantly larger than the other side.  She has an appointment this Friday to see Dr Myna Hidalgo.  I spoke to Dr Ezzard Standing and he recommends she have Dr Myna Hidalgo check it, start taking a daily Aspirin and see Dr Ezzard Standing next week.   The pt is aware and I gave an appointment for 8/28.

## 2012-06-17 ENCOUNTER — Ambulatory Visit: Payer: Federal, State, Local not specified - PPO

## 2012-06-17 ENCOUNTER — Ambulatory Visit (HOSPITAL_BASED_OUTPATIENT_CLINIC_OR_DEPARTMENT_OTHER): Payer: Federal, State, Local not specified - PPO | Admitting: Hematology & Oncology

## 2012-06-17 ENCOUNTER — Ambulatory Visit (HOSPITAL_BASED_OUTPATIENT_CLINIC_OR_DEPARTMENT_OTHER)
Admission: RE | Admit: 2012-06-17 | Discharge: 2012-06-17 | Disposition: A | Discharge status: Home / Self Care | Payer: Federal, State, Local not specified - PPO | Source: Ambulatory Visit | Attending: Hematology & Oncology | Admitting: Hematology & Oncology

## 2012-06-17 ENCOUNTER — Other Ambulatory Visit: Payer: Self-pay | Admitting: Hematology & Oncology

## 2012-06-17 ENCOUNTER — Ambulatory Visit (HOSPITAL_COMMUNITY)
Admission: RE | Admit: 2012-06-17 | Discharge: 2012-06-17 | Disposition: A | Discharge status: Home / Self Care | Payer: Federal, State, Local not specified - PPO | Source: Ambulatory Visit | Attending: Hematology & Oncology | Admitting: Hematology & Oncology

## 2012-06-17 ENCOUNTER — Telehealth: Payer: Self-pay | Admitting: *Deleted

## 2012-06-17 ENCOUNTER — Other Ambulatory Visit: Payer: Self-pay | Admitting: *Deleted

## 2012-06-17 DIAGNOSIS — M7989 Other specified soft tissue disorders: Secondary | ICD-10-CM | POA: Insufficient documentation

## 2012-06-17 DIAGNOSIS — C773 Secondary and unspecified malignant neoplasm of axilla and upper limb lymph nodes: Secondary | ICD-10-CM

## 2012-06-17 DIAGNOSIS — C50419 Malignant neoplasm of upper-outer quadrant of unspecified female breast: Secondary | ICD-10-CM

## 2012-06-17 DIAGNOSIS — C50919 Malignant neoplasm of unspecified site of unspecified female breast: Secondary | ICD-10-CM

## 2012-06-17 DIAGNOSIS — I82B19 Acute embolism and thrombosis of unspecified subclavian vein: Secondary | ICD-10-CM

## 2012-06-17 DIAGNOSIS — I82629 Acute embolism and thrombosis of deep veins of unspecified upper extremity: Secondary | ICD-10-CM

## 2012-06-17 DIAGNOSIS — I82409 Acute embolism and thrombosis of unspecified deep veins of unspecified lower extremity: Secondary | ICD-10-CM

## 2012-06-17 MED ORDER — LIDOCAINE HCL 1 % IJ SOLN
INTRAMUSCULAR | Status: AC
  Filled 2012-06-17: qty 20

## 2012-06-17 MED ORDER — SODIUM CHLORIDE 0.9 % IJ SOLN
10.0000 mL | INTRAMUSCULAR | Status: AC | PRN
  Administered 2012-06-17: 10 mL via INTRAVENOUS

## 2012-06-17 MED ORDER — IOHEXOL 300 MG/ML  SOLN
50.0000 mL | Freq: Once | INTRAMUSCULAR | Status: AC | PRN
  Administered 2012-06-17: 30 mL via INTRAVENOUS

## 2012-06-17 MED ORDER — HEPARIN SOD (PORK) LOCK FLUSH 100 UNIT/ML IV SOLN
500.0000 [IU] | Freq: Once | INTRAVENOUS | Status: AC
  Administered 2012-06-17: 500 [IU] via INTRAVENOUS

## 2012-06-17 MED ORDER — FONDAPARINUX SODIUM 7.5 MG/0.6ML ~~LOC~~ SOLN: 7.5000 mg | SUBCUTANEOUS | Status: DC

## 2012-06-17 NOTE — Patient Instructions (Addendum)
Subcutaneous Injection Using a Syringe  A subcutaneous (SQ) injection is an injection of medicine given into the layer between the skin and the muscle. A needle is attached to a syringe with medicine in it. Some syringes will have a needle that is permanently attached and cannot be removed.  SUPPLIES YOU WILL NEED:  Syringe.   Medicine prescribed by your caregiver.   Alcohol wipes.   Puncture proof sharps disposal container.  HOW TO GIVE YOUR INJECTION  1. Wash your hands with soap and water.  2. Choose the place where you are going to give your injection. Give your injection in a different spot each time. You can give an injection in your abdomen, thigh, or the back of the arm.  3. Use an alcohol wipe to clean the area of the body to be injected.  4. Remove the plastic cover off of the needle.  5.  Pinch up about 1 inch of skin and hold it.  6. Put the needle straight into the skin (90-degree angle). Put the needle in as far as it will go (to the hub). The needle may need to be injected at a 45-degree angle in small adults or children with little fat.  7. When the needle is in, you can let go of your skin.  8. Push the plunger all the way down to inject the medicine.  9. Pull the needle straight out of your skin.  10. Press the alcohol wipe over the place where you gave your injection. Hold it there for a few seconds. Do not rub the area.  11. Do not put the plastic cover back on the needle.  THROWING AWAY SUPPLIES  Place your syringe and needle in a puncture proof sharps disposal container. Follow the disposal regulations for the area where you live. Document Released: 06/27/2011 Document Revised: 10/03/2011 Document Reviewed: 06/27/2011 [facility] Patient Information 2012 [place], [place].    Fondaparinux Injections Fondaparinux (Arixtra) injection is a blood thinner (anticoagulant) medication that "thins" the blood and helps to prevent blood clots from developing in your veins. If  blood clots develop and are left untreated, they can travel to your heart, lungs, or brain. These clots can cause serious illness and can be fatal. Blood clots can form due to:  Prolonged immobility such as:   People who cannot get out of bed (bedridden).   Sitting for long periods of time, such as long airplane flights.   Surgery, especially operations that involve:   Orthopedic (bones and joints).   The abdomen.   Obesity.   Certain heart conditions.   Certain cancers.   Hormone Replacement Therapy (HRT) or birth control pills (uncommon but possible).                                              WHAT IF A BLOOD CLOT HAS ALREADY DEVELOPED? If a blood clot has developed, caregivers can use fondaparinux to treat clots in the legs or lungs. In addition to fondaparinux, another blood thinner called warfarin (Coumadin) will be started 2 to 3 days after fondaparinux has been started. warfarin is a pill and must be taken simultaneously with fondaparinux until the warfarin has begun to work.  Your caregiver may use a blood test called an INR or International Normalization Ratio to know when there is enough warfarin in the blood. Fondaparinux may be stopped when  the INR level is between 2.0 to 3.0. This means that your blood is at the necessary and best level to treat the clots that have formed.   OTHER FONDAPARINUX USES  In [place], fondaparinux is sometimes used to help when there is not enough blood flow to the heart.  HOME CARE INSTRUCTIONS   Fondaparinux should not be used if you have allergies to the medication, heparin or pork products.   Before giving your medication, make sure the solution is a clear and colorless. If your medication becomes discolored or has particles in the bottle, do not use it. Notify your caregiver right away.   Keep your medication safely stored at room temperatures.   You will be instructed by your caregiver how to give fondaparinux injections.     HOW TO INJECT FONDAPARINUX 1. Fondaparinux is a shot that is given in the stomach (abdomen). Change (rotate) the injection site each time you give yourself a shot.  2. Twist the plunger cap and remove it.  3. Hold the syringe with either hand and use your other hand to twist the rigid needle guard (covers the needle) counter-clockwise. Pull the rigid needle guard straight off the needle. Discard the needle guard.  4. When using the pre-filled syringes, do not expel the air bubble from the syringe before the injection. The air bubble helps to inject all of the medication when it is given.  5. The injection will be given just underneath the skin into the fat of the belly (subcutaneously). The shots should be injected around the outside of the belly (abdominal wall). Change the place where you give the shot each time. The whole length of the needle should be introduced into a skin fold held between the thumb and forefinger; the skin fold should be held throughout the injection.  6. Inject fondaparinux by pushing the plunger to the bottom of the syringe. Push the plunger rod firmly with your thumb as far as it will go. This will ensure you have injected all of the medication. Do not rub the injection site after completion of the injection. This increases bruising.  7. Remove the syringe from the injection site, keeping your finger on the plunger rod. Be careful not to stick yourself or others.  8. Fondaparinux injection pre-filled syringes and graduated pre-filled syringes are available with a system that shields the needle after injection. After injection and the syringe is empty, set off the safety system by firmly pushing the plunger rod. The protective sleeve will automatically cover the needle and you can hear a click. The click means your needle is safely covered.  9. Get rid of the syringe in the nearest needle box (sharps container).  10.  FONDAPARINUX WARNINGS Problems with fondaparinux are  rare. However, side effects and complications can occur, such as:  Serious side effects can include:   If you are taking fondaparinux or other low-molecular-weight heparins and have an epidural or spinal anesthesia or a spinal tap, you are at risk of developing a blood clot in or around the spine. This condition could result in long-term or permanent paralysis.   A condition called heparin-induced thrombocytopenia (HIT) can occur with fondaparinux use. This is where your platelet count drops. HIT is rare. However, your caregiver will perform lab work to check your blood levels. If you have had this condition before, you should tell your caregiver.   Because fondaparinux thins your blood, complications may include bleeding into the bowel or kidneys.   Mild side  effects may include:   Bruising around the injection site.   Mild irritation at the site of injection, such as pain, itching, or redness of skin.    SEEK IMMEDIATE MEDICAL CARE IF:  You develop bleeding problems such as:   Vomiting blood or coughing up blood.   Blood in your urine.   Blood in your stool or your stool has a dark, tarry, or coffee ground appearance.   You develop any rashes on your skin, such as:   Multiple red "dots."   Long red streaks.   You have large areas of bruising on your skin.   A sudden nosebleed that does not stop after 15 to 20 minutes.   You have any worsening of the condition that led you to fondaparinux treatment.   You develop chest pain or shortness of breath. If the chest pain or shortness of breath is severe, call your local emergency service immediately!    MAKE SURE YOU:   Understand these instructions.   Will watch your condition.   Will get help right away if you are not doing well or get worse.    Document Released: 01/10/2009 Document Revised: 10/03/2011 Document Reviewed: 01/10/2009 [facility] Patient Information 2012 [place], [place].

## 2012-06-17 NOTE — Addendum Note (Signed)
Addended by: Lacie Draft on: 06/17/2012 05:01 PM   Modules accepted: Orders, SmartSet

## 2012-06-17 NOTE — Progress Notes (Signed)
This office note has been dictated.

## 2012-06-18 ENCOUNTER — Ambulatory Visit: Payer: Federal, State, Local not specified - PPO

## 2012-06-18 NOTE — Progress Notes (Signed)
CC:   Maureen Duncan. Maureen Duncan, M.D.  DIAGNOSES: 1. 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 2. Stage IIB (T2 N1 M0) ductal carcinoma of the right breast.  CURRENT THERAPY: 1. Patient [place] status post 5 cycles of TCH. 2. Patient to start Arixtra 7.5 mg subcu daily.  INTERIM HISTORY:  Maureen Duncan comes into the office for a visit today. She called saying that left arm was swollen.  Her breast cancer is on the right side.  She has a Port-A-Cath on the left side.  She noticed this a couple of days ago.  We saw her.  I was worried that she had a thrombus in the left arm.  We initially got a Doppler of her left arm.  Doppler, shockingly enough, did not show any obvious DVT in the left upper extremity.  We then went ahead and got a venogram.  This was done at [facility]. The venogram showed no flow within the left subclavian vein.  There were collateral vessels in the left upper neck.  The left innominate vein and SVC were patent.  The left internal jugular was patent.  There was some stenosis noted in the left subclavian vein.  I spoke with Dr. Lowella Dandy regarding this.  He was not sure if this thrombus was really new or chronic given that there were collaterals.  He felt that the Port-A-Cath probably needed to come out.  We have 1 more cycle of chemo to go.  This in 2 days.  She has very limited IV access.  I went ahead and got her on Arixtra.  I think this would be reasonable. Hopefully, we will start to see some improvement in the left arm swelling.  If not, then we may have to consider getting the Port-A-Cath removed.  She then may need to have some type of angioplasty done to try to help prop open the stenotic area.  With respect to her "maintenance" by Herceptin, we have some flexibility with respect to when to get that started.  I really want to keep her on schedule with her adjuvant chemotherapy given that she has a fairly aggressive breast cancer.  She feels well.   She has had no cough.  There is no shortness of breath. There is no real pain in the left arm.  She says it does get heavy when she tries to use it.  PHYSICAL EXAMINATION:  Her left arm was swollen.  She had good pulses in her distal extremities.  She had decent range of motion.  There is no erythema noted in the left arm.  The right arm looked okay.  There may be a little bit of lymphedema in the right hand.  I really do not see any obvious collateral vessels along the chest wall.  Her Port-A-Cath site looked intact.  There was no adenopathy in her neck.  Lungs: Clear.  Cardiac:  Regular rate and rhythm with no murmurs, rubs or bruits.  Abdomen:  Soft.  For now, we will see about the Arixtra for her.  We gave her samples. We will have to see as to how long she will be on this and whether or not it will be effective.  Again, if she has stenosis in the subclavian vein, the port may have to come out.  I really think we can get away with keeping it in for right now as long as she is on anticoagulation.  We will see her on Friday for her chemotherapy.  It was a  long day for Maureen Duncan.  She has been a real "trooper."    ______________________________ Josph Macho, M.D. PRE/MEDQ  D:  06/17/2012  T:  06/18/2012  Job:  [id number]

## 2012-06-19 ENCOUNTER — Ambulatory Visit: Payer: Federal, State, Local not specified - PPO

## 2012-06-19 ENCOUNTER — Other Ambulatory Visit (HOSPITAL_BASED_OUTPATIENT_CLINIC_OR_DEPARTMENT_OTHER): Payer: Federal, State, Local not specified - PPO | Admitting: Lab

## 2012-06-19 ENCOUNTER — Ambulatory Visit (HOSPITAL_BASED_OUTPATIENT_CLINIC_OR_DEPARTMENT_OTHER): Payer: Federal, State, Local not specified - PPO | Admitting: Hematology & Oncology

## 2012-06-19 VITALS — BP 134/77 | HR 91 | Temp 98.0°F | Resp 18 | Ht 65.0 in | Wt 164.0 lb

## 2012-06-19 DIAGNOSIS — C773 Secondary and unspecified malignant neoplasm of axilla and upper limb lymph nodes: Secondary | ICD-10-CM

## 2012-06-19 DIAGNOSIS — I82B19 Acute embolism and thrombosis of unspecified subclavian vein: Secondary | ICD-10-CM

## 2012-06-19 DIAGNOSIS — C50419 Malignant neoplasm of upper-outer quadrant of unspecified female breast: Secondary | ICD-10-CM

## 2012-06-19 DIAGNOSIS — C50919 Malignant neoplasm of unspecified site of unspecified female breast: Secondary | ICD-10-CM

## 2012-06-19 LAB — CBC WITH DIFFERENTIAL (CANCER CENTER ONLY)
BASO#: 0 10*3/uL (ref 0.0–0.2)
BASO%: 0.3 % (ref 0.0–2.0)
EOS%: 0 % (ref 0.0–7.0)
HGB: 7.6 g/dL — ABNORMAL LOW (ref 11.6–15.9)
LYMPH#: 1.5 10*3/uL (ref 0.9–3.3)
MCHC: 34.2 g/dL (ref 32.0–36.0)
MONO%: 10.4 % (ref 0.0–13.0)
NEUT#: 1.9 10*3/uL (ref 1.5–6.5)
Platelets: 53 10*3/uL — ABNORMAL LOW (ref 145–400)

## 2012-06-19 NOTE — Progress Notes (Unsigned)
Mrs. Rhaya, [place] will came back on Monday for her treatment.

## 2012-06-19 NOTE — Progress Notes (Signed)
CC:   Maureen Duncan. Maureen Duncan, M.D. Maureen Duncan, M.D.  DIAGNOSES: 1. Stage IIB (T2 N1 M0) ductal carcinoma of the right breast. 2. Left subclavian thrombosis.  CURRENT THERAPY: 1. The patient is status post 5 cycles of TCH. 2. Arixtra 7.5 mg subcu daily.  INTERIM HISTORY:  Maureen Duncan comes in for her regular appointment.  She is to have her 6th and final cycle of chemotherapy today.  We saw her a couple days ago.  She had swelling of her left arm.  She was found to have a stenosis in the left subclavian vein with a thrombus.  The thrombus was partially occlusive.  We started her on Arixtra.  The left arm was still swollen.  We want to try to keep the Port-A-Cath in until we can give her her last cycle of chemotherapy.  She is feeling okay.  Again, the left arm is still swollen.  There is no cough or shortness of breath.  She has no nausea or vomiting.  There is no bleeding.  There has been no headache.  There has been no change in bowel or bladder habits.  PHYSICAL EXAMINATION:  General:  This is a well-developed, well- nourished black female in no obvious distress.  Vital signs:  98.6, pulse 91, respiratory rate 18, blood pressure 134/77.  Weight is 164. Head and neck:  Shows a normocephalic, atraumatic skull.  There are no ocular or oral lesions.  There are no palpable cervical or supraclavicular lymph nodes.  Lungs:  Clear bilaterally.  Cardiac: Regular rate and rhythm with a normal S1 and S2.  There are no murmurs, rubs or bruits.  Abdomen:  Soft with good bowel sounds.  There is no palpable abdominal mass.  There is no fluid wave.  There is no palpable hepatosplenomegaly.  Extremities:  Show the nonpitting edema of the left arm.  There is no erythema of the left arm.  She has good range of motion of her joints.  She had good pulses in her distal extremities. Legs show no clubbing, cyanosis or edema.  Skin:  Shows no rash, ecchymosis or petechia.  LABORATORY STUDIES:   White cell count 3.8, hemoglobin 7.6, hematocrit 22.2, platelet count 53.  IMPRESSION:  Maureen Duncan is a 61 year old African American female with stage IIB infiltrating ductal carcinoma of the right breast.  She had 3 positive lymph nodes.  Her tumor is ER negative and HER-2 positive.  Our main issue now is this subclavian thrombus and stenosis.  I thought that we would be able to treat her today.  Unfortunately, her blood count is just too low to treat her.  Her platelet count is __________.  I would like to expect that her platelet count should be on the way back up.  As such, we will have to put it off for a few days.  We will have her come back on Monday.  Hopefully we will find that her platelet count is higher.  If so, then we can move ahead with her final cycle of chemotherapy.  I spoke with Dr. Ovidio Kin, who is Maureen Duncan's surgeon.  I told him that the Port-A-Cath is going to have to come out the day after her chemo is given.  He will get this arranged.  We will need to have another Port-A-Cath placed for her year long Herceptin program.  We can move the start date back for the Herceptin maintenance by a couple weeks if we need to.  I am  just surprised that we have these complication.  I cannot remember the last time I saw this happen with a Port-A-Cath.  Again, we will have Maureen Duncan come back on Monday.  We will check her lab work out then.  Hopefully, with the Arixtra she will have improvement in the left arm.    ______________________________ Josph Macho, M.D. PRE/MEDQ  D:  06/19/2012  T:  06/19/2012  Job:  [id number]

## 2012-06-19 NOTE — Progress Notes (Signed)
This office note has been dictated.

## 2012-06-20 ENCOUNTER — Ambulatory Visit: Payer: Federal, State, Local not specified - PPO

## 2012-06-22 ENCOUNTER — Other Ambulatory Visit (HOSPITAL_BASED_OUTPATIENT_CLINIC_OR_DEPARTMENT_OTHER): Payer: Federal, State, Local not specified - PPO | Admitting: Lab

## 2012-06-22 ENCOUNTER — Ambulatory Visit (HOSPITAL_BASED_OUTPATIENT_CLINIC_OR_DEPARTMENT_OTHER): Payer: Federal, State, Local not specified - PPO

## 2012-06-22 VITALS — BP 127/82 | HR 84 | Temp 98.1°F | Resp 16

## 2012-06-22 DIAGNOSIS — Z5111 Encounter for antineoplastic chemotherapy: Secondary | ICD-10-CM

## 2012-06-22 DIAGNOSIS — C50519 Malignant neoplasm of lower-outer quadrant of unspecified female breast: Secondary | ICD-10-CM

## 2012-06-22 DIAGNOSIS — C773 Secondary and unspecified malignant neoplasm of axilla and upper limb lymph nodes: Secondary | ICD-10-CM

## 2012-06-22 DIAGNOSIS — C50919 Malignant neoplasm of unspecified site of unspecified female breast: Secondary | ICD-10-CM

## 2012-06-22 LAB — CBC WITH DIFFERENTIAL (CANCER CENTER ONLY)
BASO%: 0 % (ref 0.0–2.0)
EOS%: 0.7 % (ref 0.0–7.0)
HCT: 24.4 % — ABNORMAL LOW (ref 34.8–46.6)
LYMPH#: 1.6 10*3/uL (ref 0.9–3.3)
MCHC: 33.2 g/dL (ref 32.0–36.0)
MONO#: 0.5 10*3/uL (ref 0.1–0.9)
NEUT%: 49.3 % (ref 39.6–80.0)
Platelets: 86 10*3/uL — ABNORMAL LOW (ref 145–400)
RDW: 20.1 % — ABNORMAL HIGH (ref 11.1–15.7)
WBC: 4.2 10*3/uL (ref 3.9–10.0)

## 2012-06-22 MED ORDER — HEPARIN SOD (PORK) LOCK FLUSH 100 UNIT/ML IV SOLN
500.0000 [IU] | Freq: Once | INTRAVENOUS | Status: DC | PRN
  Filled 2012-06-22: qty 5

## 2012-06-22 MED ORDER — DIPHENHYDRAMINE HCL 25 MG PO CAPS
50.0000 mg | ORAL_CAPSULE | Freq: Once | ORAL | Status: AC
  Administered 2012-06-22: 50 mg via ORAL

## 2012-06-22 MED ORDER — SODIUM CHLORIDE 0.9 % IV SOLN
Freq: Once | INTRAVENOUS | Status: AC
  Administered 2012-06-22: 09:00:00 via INTRAVENOUS

## 2012-06-22 MED ORDER — DOCETAXEL CHEMO INJECTION 160 MG/16ML
75.0000 mg/m2 | Freq: Once | INTRAVENOUS | Status: AC
  Administered 2012-06-22: 140 mg via INTRAVENOUS
  Filled 2012-06-22: qty 14

## 2012-06-22 MED ORDER — DEXAMETHASONE SODIUM PHOSPHATE 4 MG/ML IJ SOLN
20.0000 mg | Freq: Once | INTRAMUSCULAR | Status: AC
  Administered 2012-06-22: 20 mg via INTRAVENOUS

## 2012-06-22 MED ORDER — SODIUM CHLORIDE 0.9 % IV SOLN
690.0000 mg | Freq: Once | INTRAVENOUS | Status: AC
  Administered 2012-06-22: 690 mg via INTRAVENOUS
  Filled 2012-06-22: qty 69

## 2012-06-22 MED ORDER — ACETAMINOPHEN 325 MG PO TABS
650.0000 mg | ORAL_TABLET | Freq: Once | ORAL | Status: AC
  Administered 2012-06-22: 650 mg via ORAL

## 2012-06-22 MED ORDER — SODIUM CHLORIDE 0.9 % IV SOLN: Freq: Once | INTRAVENOUS | Status: DC

## 2012-06-22 MED ORDER — HEPARIN SOD (PORK) LOCK FLUSH 100 UNIT/ML IV SOLN
500.0000 [IU] | Freq: Once | INTRAVENOUS | Status: AC | PRN
  Administered 2012-06-22: 500 [IU]
  Filled 2012-06-22: qty 5

## 2012-06-22 MED ORDER — SODIUM CHLORIDE 0.9 % IJ SOLN
10.0000 mL | INTRAMUSCULAR | Status: DC | PRN
  Filled 2012-06-22: qty 10

## 2012-06-22 MED ORDER — ONDANSETRON 16 MG/50ML IVPB (CHCC)
16.0000 mg | Freq: Once | INTRAVENOUS | Status: AC
  Administered 2012-06-22: 16 mg via INTRAVENOUS

## 2012-06-22 MED ORDER — TRASTUZUMAB CHEMO INJECTION 440 MG
6.0000 mg/kg | Freq: Once | INTRAVENOUS | Status: AC
  Administered 2012-06-22: 420 mg via INTRAVENOUS
  Filled 2012-06-22: qty 20

## 2012-06-22 MED ORDER — SODIUM CHLORIDE 0.9 % IJ SOLN
10.0000 mL | INTRAMUSCULAR | Status: DC | PRN
  Administered 2012-06-22: 10 mL
  Filled 2012-06-22: qty 10

## 2012-06-22 NOTE — Patient Instructions (Signed)
[facility] Discharge Instructions for Patients Receiving Chemotherapy  Today you received the following chemotherapy agents Carboplatin, taxotere To help prevent nausea and vomiting after your treatment, we encourage you to take your nausea medication   If you develop nausea and vomiting that is not controlled by your nausea medication, call the clinic. If it is after clinic hours your family physician or the after hours number for the clinic or go to the Emergency Department.   BELOW ARE SYMPTOMS THAT SHOULD BE REPORTED IMMEDIATELY:  *FEVER GREATER THAN 100.5 F  *CHILLS WITH OR WITHOUT FEVER  NAUSEA AND VOMITING THAT IS NOT CONTROLLED WITH YOUR NAUSEA MEDICATION  *UNUSUAL SHORTNESS OF BREATH  *UNUSUAL BRUISING OR BLEEDING  TENDERNESS IN MOUTH AND THROAT WITH OR WITHOUT PRESENCE OF ULCERS  *URINARY PROBLEMS  *BOWEL PROBLEMS  UNUSUAL RASH Items with * indicate a potential emergency and should be followed up as soon as possible.  One of the nurses will contact you 24 hours after your treatment. Please let the nurse know about any problems that you may have experienced. Feel free to call the clinic you have any questions or concerns. The clinic phone number is [phone].   I have been informed and understand all the instructions given to me. I know to contact the clinic, my physician, or go to the Emergency Department if any problems should occur. I do not have any questions at this time, but understand that I may call the clinic during office hours   should I have any questions or need assistance in obtaining follow up care.    __________________________________________  _____________  __________ Signature of Patient or Authorized Representative            Date                   Time    __________________________________________ Nurse's Signature      [facility] Discharge Instructions for Patients Receiving Chemotherapy  Today you  received the following chemotherapy agents Carboplatin, Taxotere, Herceptin  To help prevent nausea and vomiting after your treatment, we encourage you to take your nausea medication:  Zofran 8mg : 1 tab twice daily for 4 days starting the day after chemo then twice daily as needed   Decadron 4mg : 2 tabs twice daily for 4 days starting the day after chemo  Ativan 1mg : 1 tab every 8 hours as needed for nausea, vomiting, sleep, or anxiety (may also dissolve under tongue).  If you develop nausea and vomiting that is not controlled by your nausea medication, call the clinic. If it is after clinic hours your family physician or the after hours number for the clinic or go to the Emergency Department.   BELOW ARE SYMPTOMS THAT SHOULD BE REPORTED IMMEDIATELY:  *FEVER GREATER THAN 100.5 F  *CHILLS WITH OR WITHOUT FEVER  NAUSEA AND VOMITING THAT IS NOT CONTROLLED WITH YOUR NAUSEA MEDICATION  *UNUSUAL SHORTNESS OF BREATH  *UNUSUAL BRUISING OR BLEEDING  TENDERNESS IN MOUTH AND THROAT WITH OR WITHOUT PRESENCE OF ULCERS  *URINARY PROBLEMS  *BOWEL PROBLEMS  UNUSUAL RASH Items with * indicate a potential emergency and should be followed up as soon as possible.  One of the nurses will contact you 24 hours after your treatment. Please let the nurse know about any problems that you may have experienced. Feel free to call the clinic you have any questions or concerns. The clinic phone number is [phone].   I have been informed and  understand all the instructions given to me. I know to contact the clinic, my physician, or go to the Emergency Department if any problems should occur. I do not have any questions at this time, but understand that I may call the clinic during office hours   should I have any questions or need assistance in obtaining follow up care.    __________________________________________  _____________  __________ Signature of Patient or Authorized Representative             Date                   Time    __________________________________________ Nurse's Signature     Carboplatin injection What is this medicine? CARBOPLATIN (KAR boe pla tin) is a chemotherapy drug. It targets fast dividing cells, like cancer cells, and causes these cells to die. This medicine is used to treat ovarian cancer and many other cancers. This medicine may be used for other purposes; ask your health care provider or pharmacist if you have questions. What should I tell my health care provider before I take this medicine? They need to know if you have any of these conditions: -blood disorders -hearing problems -kidney disease -recent or ongoing radiation therapy -an unusual or allergic reaction to carboplatin, cisplatin, other chemotherapy, other medicines, foods, dyes, or preservatives -pregnant or trying to get pregnant -breast-feeding How should I use this medicine? This drug is usually given as an infusion into a vein. It is administered in a hospital or clinic by a specially trained health care professional. Talk to your pediatrician regarding the use of this medicine in children. Special care may be needed. Overdosage: If you think you have taken too much of this medicine contact a poison control center or emergency room at once. NOTE: This medicine is only for you. Do not share this medicine with others. What if I miss a dose? It is important not to miss a dose. Call your doctor or health care professional if you are unable to keep an appointment. What may interact with this medicine? -medicines for seizures -medicines to increase blood counts like filgrastim, pegfilgrastim, sargramostim -some antibiotics like amikacin, gentamicin, neomycin, streptomycin, tobramycin -vaccines Talk to your doctor or health care professional before taking any of these medicines: -acetaminophen -aspirin -ibuprofen -ketoprofen -naproxen This list may not describe all possible  interactions. Give your health care provider a list of all the medicines, herbs, non-prescription drugs, or dietary supplements you use. Also tell them if you smoke, drink alcohol, or use illegal drugs. Some items may interact with your medicine. What should I watch for while using this medicine? Your condition will be monitored carefully while you are receiving this medicine. You will need important blood work done while you are taking this medicine. This drug may make you feel generally unwell. This is not uncommon, as chemotherapy can affect healthy cells as well as cancer cells. Report any side effects. Continue your course of treatment even though you feel ill unless your doctor tells you to stop. In some cases, you may be given additional medicines to help with side effects. Follow all directions for their use. Call your doctor or health care professional for advice if you get a fever, chills or sore throat, or other symptoms of a cold or flu. Do not treat yourself. This drug decreases your body's ability to fight infections. Try to avoid being around people who are sick. This medicine may increase your risk to bruise or bleed. Call your  doctor or health care professional if you notice any unusual bleeding. Be careful brushing and flossing your teeth or using a toothpick because you may get an infection or bleed more easily. If you have any dental work done, tell your dentist you are receiving this medicine. Avoid taking products that contain aspirin, acetaminophen, ibuprofen, naproxen, or ketoprofen unless instructed by your doctor. These medicines may hide a fever. Do not become pregnant while taking this medicine. Women should inform their doctor if they wish to become pregnant or think they might be pregnant. There is a potential for serious side effects to an unborn child. Talk to your health care professional or pharmacist for more information. Do not breast-feed an infant while taking this  medicine. What side effects may I notice from receiving this medicine? Side effects that you should report to your doctor or health care professional as soon as possible: -allergic reactions like skin rash, itching or hives, swelling of the face, lips, or tongue -signs of infection - fever or chills, cough, sore throat, pain or difficulty passing urine -signs of decreased platelets or bleeding - bruising, pinpoint red spots on the skin, black, tarry stools, nosebleeds -signs of decreased red blood cells - unusually weak or tired, fainting spells, lightheadedness -breathing problems -changes in hearing -changes in vision -chest pain -high blood pressure -low blood counts - This drug may decrease the number of white blood cells, red blood cells and platelets. You may be at increased risk for infections and bleeding. -nausea and vomiting -pain, swelling, redness or irritation at the injection site -pain, tingling, numbness in the hands or feet -problems with balance, talking, walking -trouble passing urine or change in the amount of urine Side effects that usually do not require medical attention (report to your doctor or health care professional if they continue or are bothersome): -hair loss -loss of appetite -metallic taste in the mouth or changes in taste This list may not describe all possible side effects. Call your doctor for medical advice about side effects. You may report side effects to FDA at 1-800-FDA-1088. Where should I keep my medicine? This drug is given in a hospital or clinic and will not be stored at home. NOTE: This sheet is a summary. It may not cover all possible information. If you have questions about this medicine, talk to your doctor, pharmacist, or health care provider.  2012, Elsevier/Gold Standard. (01/19/2008 2:38:05 PM)   Trastuzumab injection for infusion  What is this medicine? TRASTUZUMAB (tras TOO zoo mab) is a monoclonal antibody. It targets a protein  called HER2. This protein is found in some stomach and breast cancers. This medicine can stop cancer cell growth. This medicine may be used with other cancer treatments. This medicine may be used for other purposes; ask your health care provider or pharmacist if you have questions. What should I tell my health care provider before I take this medicine? They need to know if you have any of these conditions: -heart disease -heart failure -infection (especially a virus infection such as chickenpox, cold sores, or herpes) -lung or breathing disease, like asthma -recent or ongoing radiation therapy -an unusual or allergic reaction to trastuzumab, benzyl alcohol, or other medications, foods, dyes, or preservatives -pregnant or trying to get pregnant -breast-feeding How should I use this medicine? This drug is given as an infusion into a vein. It is administered in a hospital or clinic by a specially trained health care professional. Talk to your pediatrician regarding the use of  this medicine in children. This medicine is not approved for use in children. Overdosage: If you think you have taken too much of this medicine contact a poison control center or emergency room at once. NOTE: This medicine is only for you. Do not share this medicine with others. What if I miss a dose? It is important not to miss a dose. Call your doctor or health care professional if you are unable to keep an appointment. What may interact with this medicine? -cyclophosphamide -doxorubicin -warfarin This list may not describe all possible interactions. Give your health care provider a list of all the medicines, herbs, non-prescription drugs, or dietary supplements you use. Also tell them if you smoke, drink alcohol, or use illegal drugs. Some items may interact with your medicine. What should I watch for while using this medicine? Visit your doctor for checks on your progress. Report any side effects. Continue your course  of treatment even though you feel ill unless your doctor tells you to stop. Call your doctor or health care professional for advice if you get a fever, chills or sore throat, or other symptoms of a cold or flu. Do not treat yourself. Try to avoid being around people who are sick. You may experience fever, chills and shaking during your first infusion. These effects are usually mild and can be treated with other medicines. Report any side effects during the infusion to your health care professional. Fever and chills usually do not happen with later infusions. What side effects may I notice from receiving this medicine? Side effects that you should report to your doctor or other health care professional as soon as possible: -breathing difficulties -chest pain or palpitations -cough -dizziness or fainting -fever or chills, sore throat -skin rash, itching or hives -swelling of the legs or ankles -unusually weak or tired Side effects that usually do not require medical attention (report to your doctor or other health care professional if they continue or are bothersome): -loss of appetite -headache -muscle aches -nausea This list may not describe all possible side effects. Call your doctor for medical advice about side effects. You may report side effects to FDA at 1-800-FDA-1088. Where should I keep my medicine? This drug is given in a hospital or clinic and will not be stored at home. NOTE: This sheet is a summary. It may not cover all possible information. If you have questions about this medicine, talk to your doctor, pharmacist, or health care provider.  2012, Elsevier/Gold Standard. (08/18/2009 1:43:15 PM)   Docetaxel injection  What is this medicine? DOCETAXEL (doe se TAX el) is a chemotherapy drug. It targets fast dividing cells, like cancer cells, and causes these cells to die. This medicine is used to treat many types of cancers like breast cancer, certain stomach cancers, head and  neck cancer, lung cancer, and prostate cancer. This medicine may be used for other purposes; ask your health care provider or pharmacist if you have questions. What should I tell my health care provider before I take this medicine? They need to know if you have any of these conditions: -infection (especially a virus infection such as chickenpox, cold sores, or herpes) -liver disease -low blood counts, like low white cell, platelet, or red cell counts -an unusual or allergic reaction to docetaxel, polysorbate 80, other chemotherapy agents, other medicines, foods, dyes, or preservatives -pregnant or trying to get pregnant -breast-feeding How should I use this medicine? This drug is given as an infusion into a vein. It is  administered in a hospital or clinic by a specially trained health care professional. Talk to your pediatrician regarding the use of this medicine in children. Special care may be needed. Overdosage: If you think you have taken too much of this medicine contact a poison control center or emergency room at once. NOTE: This medicine is only for you. Do not share this medicine with others. What if I miss a dose? It is important not to miss your dose. Call your doctor or health care professional if you are unable to keep an appointment. What may interact with this medicine? -cyclosporine -erythromycin -ketoconazole -medicines to increase blood counts like filgrastim, pegfilgrastim, sargramostim -vaccines Talk to your doctor or health care professional before taking any of these medicines: -acetaminophen -aspirin -ibuprofen -ketoprofen -naproxen This list may not describe all possible interactions. Give your health care provider a list of all the medicines, herbs, non-prescription drugs, or dietary supplements you use. Also tell them if you smoke, drink alcohol, or use illegal drugs. Some items may interact with your medicine. What should I watch for while using this  medicine? Your condition will be monitored carefully while you are receiving this medicine. You will need important blood work done while you are taking this medicine. This drug may make you feel generally unwell. This is not uncommon, as chemotherapy can affect healthy cells as well as cancer cells. Report any side effects. Continue your course of treatment even though you feel ill unless your doctor tells you to stop. In some cases, you may be given additional medicines to help with side effects. Follow all directions for their use. Call your doctor or health care professional for advice if you get a fever, chills or sore throat, or other symptoms of a cold or flu. Do not treat yourself. This drug decreases your body's ability to fight infections. Try to avoid being around people who are sick. This medicine may increase your risk to bruise or bleed. Call your doctor or health care professional if you notice any unusual bleeding. Be careful brushing and flossing your teeth or using a toothpick because you may get an infection or bleed more easily. If you have any dental work done, tell your dentist you are receiving this medicine. Avoid taking products that contain aspirin, acetaminophen, ibuprofen, naproxen, or ketoprofen unless instructed by your doctor. These medicines may hide a fever. Do not become pregnant while taking this medicine. Women should inform their doctor if they wish to become pregnant or think they might be pregnant. There is a potential for serious side effects to an unborn child. Talk to your health care professional or pharmacist for more information. Do not breast-feed an infant while taking this medicine. What side effects may I notice from receiving this medicine? Side effects that you should report to your doctor or health care professional as soon as possible: -allergic reactions like skin rash, itching or hives, swelling of the face, lips, or tongue -low blood counts - This  drug may decrease the number of white blood cells, red blood cells and platelets. You may be at increased risk for infections and bleeding. -signs of infection - fever or chills, cough, sore throat, pain or difficulty passing urine -signs of decreased platelets or bleeding - bruising, pinpoint red spots on the skin, black, tarry stools, nosebleeds -signs of decreased red blood cells - unusually weak or tired, fainting spells, lightheadedness -breathing problems -fast or irregular heartbeat -low blood pressure -mouth sores -nausea and vomiting -pain, swelling, redness  or irritation at the injection site -pain, tingling, numbness in the hands or feet -swelling of the ankle, feet, hands -weight gain Side effects that usually do not require medical attention (report to your prescriber or health care professional if they continue or are bothersome): -bone pain -complete hair loss including hair on your head, underarms, pubic hair, eyebrows, and eyelashes -diarrhea -excessive tearing -changes in the color of fingernails -loosening of the fingernails -nausea -muscle pain -red flush to skin -sweating -weak or tired This list may not describe all possible side effects. Call your doctor for medical advice about side effects. You may report side effects to FDA at 1-800-FDA-1088. Where should I keep my medicine? This drug is given in a hospital or clinic and will not be stored at home. NOTE: This sheet is a summary. It may not cover all possible information. If you have questions about this medicine, talk to your doctor, pharmacist, or health care provider.  2012, Elsevier/Gold Standard. (09/26/2008 11:52:10 AM)

## 2012-06-23 ENCOUNTER — Ambulatory Visit (HOSPITAL_BASED_OUTPATIENT_CLINIC_OR_DEPARTMENT_OTHER): Payer: Federal, State, Local not specified - PPO

## 2012-06-23 ENCOUNTER — Ambulatory Visit (HOSPITAL_BASED_OUTPATIENT_CLINIC_OR_DEPARTMENT_OTHER)
Admission: RE | Admit: 2012-06-23 | Discharge: 2012-06-23 | Disposition: A | Discharge status: Home / Self Care | Payer: Federal, State, Local not specified - PPO | Source: Ambulatory Visit | Attending: Surgery | Admitting: Surgery

## 2012-06-23 ENCOUNTER — Ambulatory Visit: Payer: Federal, State, Local not specified - PPO

## 2012-06-23 ENCOUNTER — Encounter (HOSPITAL_BASED_OUTPATIENT_CLINIC_OR_DEPARTMENT_OTHER)
Admission: RE | Disposition: A | Discharge status: Home / Self Care | Payer: Self-pay | Source: Ambulatory Visit | Attending: Surgery

## 2012-06-23 VITALS — BP 129/75 | HR 112 | Temp 98.3°F

## 2012-06-23 DIAGNOSIS — T82898A Other specified complication of vascular prosthetic devices, implants and grafts, initial encounter: Secondary | ICD-10-CM

## 2012-06-23 DIAGNOSIS — C773 Secondary and unspecified malignant neoplasm of axilla and upper limb lymph nodes: Secondary | ICD-10-CM

## 2012-06-23 DIAGNOSIS — Z5189 Encounter for other specified aftercare: Secondary | ICD-10-CM

## 2012-06-23 DIAGNOSIS — C50919 Malignant neoplasm of unspecified site of unspecified female breast: Secondary | ICD-10-CM

## 2012-06-23 DIAGNOSIS — Z853 Personal history of malignant neoplasm of breast: Secondary | ICD-10-CM | POA: Insufficient documentation

## 2012-06-23 DIAGNOSIS — Y832 Surgical operation with anastomosis, bypass or graft as the cause of abnormal reaction of the patient, or of later complication, without mention of misadventure at the time of the procedure: Secondary | ICD-10-CM | POA: Insufficient documentation

## 2012-06-23 DIAGNOSIS — C50519 Malignant neoplasm of lower-outer quadrant of unspecified female breast: Secondary | ICD-10-CM

## 2012-06-23 DIAGNOSIS — T82598A Other mechanical complication of other cardiac and vascular devices and implants, initial encounter: Secondary | ICD-10-CM | POA: Insufficient documentation

## 2012-06-23 HISTORY — PX: PORT-A-CATH REMOVAL: SHX5289

## 2012-06-23 SURGERY — MINOR REMOVAL PORT-A-CATH
Anesthesia: LOCAL | Site: Chest | Laterality: Left | Wound class: Clean

## 2012-06-23 MED ORDER — LIDOCAINE-EPINEPHRINE 1 %-1:100000 IJ SOLN
INTRAMUSCULAR | Status: DC | PRN
  Administered 2012-06-23: 14 mL

## 2012-06-23 MED ORDER — PEGFILGRASTIM INJECTION 6 MG/0.6ML
6.0000 mg | Freq: Once | SUBCUTANEOUS | Status: AC
  Administered 2012-06-23: 6 mg via SUBCUTANEOUS
  Filled 2012-06-23: qty 0.6

## 2012-06-23 SURGICAL SUPPLY — 36 items
ADH SKN CLS APL DERMABOND .7 (GAUZE/BANDAGES/DRESSINGS) ×1
APL SKNCLS STERI-STRIP NONHPOA (GAUZE/BANDAGES/DRESSINGS)
BANDAGE ADHESIVE 1X3 (GAUZE/BANDAGES/DRESSINGS) IMPLANT
BENZOIN TINCTURE PRP APPL 2/3 (GAUZE/BANDAGES/DRESSINGS) IMPLANT
BLADE SURG 15 STRL LF DISP TIS (BLADE) ×1 IMPLANT
BLADE SURG 15 STRL SS (BLADE) ×2
CLOTH BEACON ORANGE TIMEOUT ST (SAFETY) ×2 IMPLANT
DERMABOND ADVANCED (GAUZE/BANDAGES/DRESSINGS) ×1
DERMABOND ADVANCED .7 DNX12 (GAUZE/BANDAGES/DRESSINGS) IMPLANT
DRAPE UTILITY XL STRL (DRAPES) ×1 IMPLANT
GAUZE SPONGE 4X4 12PLY STRL LF (GAUZE/BANDAGES/DRESSINGS) ×1 IMPLANT
GLOVE ECLIPSE 6.5 STRL STRAW (GLOVE) ×1 IMPLANT
GLOVE SURG SIGNA 7.5 PF LTX (GLOVE) ×2 IMPLANT
MARKER SKIN DUAL TIP RULER LAB (MISCELLANEOUS) ×1 IMPLANT
NDL HYPO 25X1 1.5 SAFETY (NEEDLE) IMPLANT
NDL HYPO 25X5/8 SAFETYGLIDE (NEEDLE) ×1 IMPLANT
NDL SAFETY ECLIPSE 18X1.5 (NEEDLE) ×1 IMPLANT
NEEDLE HYPO 18GX1.5 SHARP (NEEDLE)
NEEDLE HYPO 25X1 1.5 SAFETY (NEEDLE) IMPLANT
NEEDLE HYPO 25X5/8 SAFETYGLIDE (NEEDLE) IMPLANT
NS IRRIG 1000ML POUR BTL (IV SOLUTION) IMPLANT
STRIP CLOSURE SKIN 1/2X4 (GAUZE/BANDAGES/DRESSINGS) IMPLANT
STRIP CLOSURE SKIN 1/4X4 (GAUZE/BANDAGES/DRESSINGS) IMPLANT
SUT ETHILON 3 0 PS 1 (SUTURE) IMPLANT
SUT ETHILON 4 0 PS 2 18 (SUTURE) IMPLANT
SUT ETHILON 5 0 P 3 18 (SUTURE)
SUT NYLON ETHILON 5-0 P-3 1X18 (SUTURE) IMPLANT
SUT VIC AB 3-0 SH 27 (SUTURE) ×2
SUT VIC AB 3-0 SH 27X BRD (SUTURE) IMPLANT
SUT VIC AB 5-0 PS2 18 (SUTURE) ×1 IMPLANT
SUT VICRYL 3-0 CR8 SH (SUTURE) IMPLANT
SUT VICRYL 4-0 PS2 18IN ABS (SUTURE) IMPLANT
SWABSTICK POVIDONE IODINE SNGL (MISCELLANEOUS) ×4 IMPLANT
SYR CONTROL 10ML LL (SYRINGE) ×2 IMPLANT
TOWEL OR NON WOVEN STRL DISP B (DISPOSABLE) ×2 IMPLANT
UNDERPAD 30X30 INCONTINENT (UNDERPADS AND DIAPERS) IMPLANT

## 2012-06-23 NOTE — Interval H&P Note (Signed)
History and Physical Interval Note:  06/23/2012 4:21 PM  Erasmo Score  has presented today for surgery, with the diagnosis of thrombosis  The various methods of treatment have been discussed with the patient and family. Daughter is with patient.  Her last Arixtra was yesterday.  Her platelet count yesterday was 86,000.  Her Hgb was 8.1 yesterday.   After consideration of risks, benefits and other options for treatment, the patient has consented to  Procedure(s) (LRB): MINOR REMOVAL PORT-A-CATH (Left) as a surgical intervention .  The patient's history has been reviewed, patient examined, no change in status, stable for surgery.  I have reviewed the patient's chart and labs.  Questions were answered to the patient's satisfaction.     Jerni Selmer H

## 2012-06-23 NOTE — OR Nursing (Signed)
Patient and daughter voice understanding of post-op instructions.  Patient denies pain, dizziness, SOB.  Discharged ambulatory to lobby with daughter.

## 2012-06-23 NOTE — Op Note (Signed)
06/23/2012  4:53 PM  PATIENT:  Erasmo Score, 61 y.o., female, MRN: [id number]  PREOP DIAGNOSIS:  thrombosis  POSTOP DIAGNOSIS:   Thrombosis left subclavian vn  PROCEDURE:   Procedure(s): MINOR REMOVAL PORT-A-CATH  SURGEON:   Ovidio Kin, M.D.  ANESTHESIA:   Local -15 cc 1% xylocaine with epi  COUNTS CORRECT:  YES  INDICATIONS FOR PROCEDURE:  LANISHA STEPANIAN is a 64 y.o. (DOB: 1951/08/14) AA female whose primary care physician is Meriel Pica, MD and comes for power port removal.   The indications and risks of the surgery were explained to the patient.  The risks include, but are not limited to, infection, bleeding, and nerve injury.  Her platelet count was 86,000 yesterday.  Operative Note:  The area over her left subclavian power port was painted with Betadine.  I infiltrated the left subclavian skin with 15 cc of 1% xylocaine.  I went through the old incision and removed the power port without difficulty.  The power port was intact.  I closed the wound in layers with 3-0 vicryl and 5-0 vicryl in the skin.  The skin was painted with Dermabond.  She'll see me in 2-3 weeks in the office for wound recheck and setting up replacing the power port.   Ovidio Kin, MD, [facility] Surgery Pager: [phone] Office phone:  [phone]

## 2012-06-23 NOTE — H&P (View-Only) (Signed)
 Re:   Maureen Duncan DOB:   07/21/1951 MRN:   [id number]  ASSESSMENT AND PLAN: 1.  Right breast cancer, T2, N1.  Right MRM - 01/30/2012.  Final path - 3.8 cm ca, 3/13 nodes positive, extracapsular tumor extension.  Final path - Grade 3, IDC.  ER/PR - negative. Her2Neu - 5.50 (positive).  Ki67 - 95%.  To finish chemotx 06/19/2012.  Treating oncologist - P. Maureen Duncan.  I will see me back in 3 months. (this is 6 months from her last regular visit)  I wrote her a prescription for a breast prosthesis.  [Spoke with Dr. Ennever.  Ms. Maureen Duncan has developed a left subclavian thrombosis.  She is on Arixtra.  He plans to treat her Monday, 8/26 with me removing the power port on Tues., 8/27.  DN  06/19/2012]  2.  History of right breast cancer - January 2001.  1.5 cm cancer, ER/PR positive, Her2Neu - neg.  Had lumpectomy and SLNBx - Dr. Price - 2001.  Saw P. Maureen Duncan and Maureen Duncan.  3.  Power port placed 01/30/2012. 4.  Genetic testing - I can't speak to all the details, but she decided to cancel genetic testing.  (See letter in files) 5.  Lymphedema involving right hand - reason for today's visit (05/28/2012)  I will refer her to the lymphedema clinic.  REFERRING PHYSICIAN: HOLLAND,RICHARD M, MD  HISTORY OF PRESENT ILLNESS: Maureen Duncan is a 60 y.o. (DOB: 01/11/1951)  AA female whose primary care physician is Maureen M, MD and comes for follow up of right mastectomy and right hand swelling.  She has noticed some mild swelling of her right hand over the last 2 or 3 weeks.  It does not hurt, she has no limit in function, and she has had no fever.  She is actively being treated by Dr. Ennever.   Past Medical History  Diagnosis Date  . Breast cancer 2000; 01/30/12    right   Current Outpatient Prescriptions  Medication Sig Dispense Refill  . Alum & Mag Hydroxide-Simeth (MAGIC MOUTHWASH W/LIDOCAINE) SOLN Take 5 mLs by mouth 4 (four) times daily as needed. Swish and Spit.  240 mL  1  . benzonatate  (TESSALON) 100 MG capsule Take 1 capsule (100 mg total) by mouth 3 (three) times daily as needed for cough.  20 capsule  0  . dexamethasone (DECADRON) 4 MG tablet as needed. Take for 4 days after chemo      . diphenhydrAMINE (BENADRYL) 25 MG tablet Take 25 mg by mouth every 6 (six) hours as needed.      . loratadine (CLARITIN) 10 MG tablet Take 1 tablet (10 mg total) by mouth daily.  30 tablet  11  . LORazepam (ATIVAN) 0.5 MG tablet 0.5 mg every 6 (six) hours as needed.       . ondansetron (ZOFRAN) 8 MG tablet every 8 (eight) hours as needed.       . traMADol (ULTRAM) 50 MG tablet every 8 (eight) hours as needed.       . DISCONTD: calcium-vitamin D (OSCAL) 250-125 MG-UNIT per tablet Take 1 tablet by mouth daily.          No Known Allergies  REVIEW OF SYSTEMS: Negative except the breast cancer.  SOCIAL and FAMILY HISTORY: Single. Has two daughters- 35 and 40.  PHYSICAL EXAM: BP 122/88  Pulse 72  Temp 98.4 F (36.9 C) (Oral)  Resp 16  Ht 5' 5" (1.651 Duncan)  Wt 159 lb (72.122   kg)  BMI 26.46 kg/m2  General: WN AA F who is alert and generally healthy appearing.  HEENT:  Pupils equal. NECK:  Supple.  No thyroid mass. LYMPH NODES:  No cervical, supraclavicular, or axillary adenopathy. BREASTS -  RIGHT:  Right breast absent.  The mastectomy wound has healed and the skin is softening up.  No nodularity or mass.   LEFT:  No palpable mass or nodule.  No nipple discharge.  Left subclavian power port okay. UPPER EXTREMITIES:  Right hand mildly swollen.  DATA REVIEWED: Reviewed Dr. Ennever's note.  Maureen Bauman, MD,  FACS [facility] [address]  [address]   [place], [place]    [address] Phone:  [phone] FAX:  [phone]  

## 2012-06-24 ENCOUNTER — Encounter (HOSPITAL_BASED_OUTPATIENT_CLINIC_OR_DEPARTMENT_OTHER): Payer: Self-pay | Admitting: Surgery

## 2012-06-24 ENCOUNTER — Encounter (INDEPENDENT_AMBULATORY_CARE_PROVIDER_SITE_OTHER): Payer: Federal, State, Local not specified - PPO | Admitting: Surgery

## 2012-06-30 ENCOUNTER — Telehealth (INDEPENDENT_AMBULATORY_CARE_PROVIDER_SITE_OTHER): Payer: Self-pay | Admitting: General Surgery

## 2012-06-30 NOTE — Telephone Encounter (Signed)
Needs post op in 2-3 weeks with Dr Ezzard Standing. Removal Port-a-cath on 06/23/2012. Please call patient to schedule [phone].

## 2012-07-01 ENCOUNTER — Ambulatory Visit: Payer: Federal, State, Local not specified - PPO | Attending: Surgery

## 2012-07-01 DIAGNOSIS — I890 Lymphedema, not elsewhere classified: Secondary | ICD-10-CM | POA: Insufficient documentation

## 2012-07-01 DIAGNOSIS — IMO0001 Reserved for inherently not codable concepts without codable children: Secondary | ICD-10-CM | POA: Insufficient documentation

## 2012-07-06 ENCOUNTER — Encounter: Payer: Federal, State, Local not specified - PPO | Admitting: Physical Therapy

## 2012-07-07 ENCOUNTER — Telehealth (INDEPENDENT_AMBULATORY_CARE_PROVIDER_SITE_OTHER): Payer: Self-pay | Admitting: General Surgery

## 2012-07-07 NOTE — Telephone Encounter (Signed)
Pt called this morning to notify Dr. Ezzard Standing of her Lt arm swelling.  She had PAC removed on 06/23/12 and follow up appt with DN on 07/09/12.  Has history of Rt mastectomy.  Advised pt to try to keep arm elevated AMAP today, contracting and relaxing muscles of arm, hand and fingers as well.  She understands and will comply.

## 2012-07-07 NOTE — Telephone Encounter (Signed)
Dr Ezzard Standing called pt and left her a message that he wasn't sure there was much to be done right now but he can talk to her if she calls back.

## 2012-07-08 ENCOUNTER — Encounter: Payer: Self-pay | Admitting: *Deleted

## 2012-07-08 ENCOUNTER — Other Ambulatory Visit: Payer: Self-pay | Admitting: Hematology & Oncology

## 2012-07-08 DIAGNOSIS — R600 Localized edema: Secondary | ICD-10-CM

## 2012-07-08 NOTE — Progress Notes (Signed)
Spoke with Inetta Fermo in [facility].  They want to schedule pt for Venogram and intervention on 07/14/12.  Pt to be there at 1000.  NPO past Midnight.  Pt to bring a driver.  There is the posibility of admission to hospital overnight.  Pt is coming in on Monday 07/13/12 for repeat CBC.  Plt were 86 at pt's last visit here.  Pt voiced understanding of instructions.

## 2012-07-09 ENCOUNTER — Encounter (INDEPENDENT_AMBULATORY_CARE_PROVIDER_SITE_OTHER): Payer: Federal, State, Local not specified - PPO | Admitting: Surgery

## 2012-07-09 ENCOUNTER — Other Ambulatory Visit: Payer: Self-pay | Admitting: Radiology

## 2012-07-10 ENCOUNTER — Encounter (HOSPITAL_COMMUNITY): Payer: Self-pay | Admitting: Pharmacy Technician

## 2012-07-13 ENCOUNTER — Other Ambulatory Visit: Payer: Self-pay | Admitting: Hematology & Oncology

## 2012-07-13 ENCOUNTER — Ambulatory Visit (HOSPITAL_BASED_OUTPATIENT_CLINIC_OR_DEPARTMENT_OTHER): Payer: Federal, State, Local not specified - PPO | Admitting: Lab

## 2012-07-13 ENCOUNTER — Ambulatory Visit (HOSPITAL_BASED_OUTPATIENT_CLINIC_OR_DEPARTMENT_OTHER): Payer: Federal, State, Local not specified - PPO | Admitting: Hematology & Oncology

## 2012-07-13 VITALS — BP 131/78 | HR 91 | Temp 98.0°F | Resp 18 | Ht 65.0 in | Wt 160.0 lb

## 2012-07-13 DIAGNOSIS — I82B19 Acute embolism and thrombosis of unspecified subclavian vein: Secondary | ICD-10-CM

## 2012-07-13 DIAGNOSIS — I82409 Acute embolism and thrombosis of unspecified deep veins of unspecified lower extremity: Secondary | ICD-10-CM

## 2012-07-13 DIAGNOSIS — D696 Thrombocytopenia, unspecified: Secondary | ICD-10-CM

## 2012-07-13 DIAGNOSIS — C50519 Malignant neoplasm of lower-outer quadrant of unspecified female breast: Secondary | ICD-10-CM

## 2012-07-13 DIAGNOSIS — O2230 Deep phlebothrombosis in pregnancy, unspecified trimester: Secondary | ICD-10-CM

## 2012-07-13 LAB — CBC WITH DIFFERENTIAL (CANCER CENTER ONLY)
BASO#: 0 10*3/uL (ref 0.0–0.2)
BASO%: 0.2 % (ref 0.0–2.0)
HCT: 25.8 % — ABNORMAL LOW (ref 34.8–46.6)
LYMPH%: 18.5 % (ref 14.0–48.0)
MCV: 115 fL — ABNORMAL HIGH (ref 81–101)
MONO#: 1.5 10*3/uL — ABNORMAL HIGH (ref 0.1–0.9)
NEUT%: 68.9 % (ref 39.6–80.0)
RDW: 19.2 % — ABNORMAL HIGH (ref 11.1–15.7)
WBC: 12 10*3/uL — ABNORMAL HIGH (ref 3.9–10.0)

## 2012-07-13 NOTE — Progress Notes (Signed)
This office note has been dictated.

## 2012-07-14 ENCOUNTER — Other Ambulatory Visit: Payer: Self-pay | Admitting: Hematology & Oncology

## 2012-07-14 ENCOUNTER — Ambulatory Visit (HOSPITAL_COMMUNITY)
Admission: RE | Admit: 2012-07-14 | Discharge: 2012-07-14 | Disposition: A | Discharge status: Home / Self Care | Payer: Federal, State, Local not specified - PPO | Source: Ambulatory Visit | Attending: Hematology & Oncology | Admitting: Hematology & Oncology

## 2012-07-14 VITALS — BP 116/70 | HR 80 | Temp 98.2°F | Resp 16 | Ht 65.0 in | Wt 160.0 lb

## 2012-07-14 DIAGNOSIS — I82B19 Acute embolism and thrombosis of unspecified subclavian vein: Secondary | ICD-10-CM | POA: Insufficient documentation

## 2012-07-14 DIAGNOSIS — Z79899 Other long term (current) drug therapy: Secondary | ICD-10-CM | POA: Insufficient documentation

## 2012-07-14 DIAGNOSIS — C50919 Malignant neoplasm of unspecified site of unspecified female breast: Secondary | ICD-10-CM | POA: Insufficient documentation

## 2012-07-14 DIAGNOSIS — R600 Localized edema: Secondary | ICD-10-CM

## 2012-07-14 LAB — HYPERCOAGULABLE PANEL, COMPREHENSIVE
Anticardiolipin IgA: 6 APL U/mL (ref <22)
Anticardiolipin IgM: 8 MPL U/mL (ref <11)
Beta-2 Glyco I IgG: 0 G Units (ref <20)
DRVVT: 32.9 secs (ref <45.1)
PTT Lupus Anticoagulant: 36.1 secs (ref 28.0–43.0)
Protein C Activity: 200 % — ABNORMAL HIGH (ref 75–133)
Protein S Activity: 147 % — ABNORMAL HIGH (ref 69–129)

## 2012-07-14 LAB — BASIC METABOLIC PANEL
BUN: 12 mg/dL (ref 6–23)
CO2: 28 mEq/L (ref 19–32)
Calcium: 9.4 mg/dL (ref 8.4–10.5)
Chloride: 102 mEq/L (ref 96–112)
Creatinine, Ser: 0.74 mg/dL (ref 0.50–1.10)
Glucose, Bld: 102 mg/dL — ABNORMAL HIGH (ref 70–99)

## 2012-07-14 LAB — CBC
HCT: 26.9 % — ABNORMAL LOW (ref 36.0–46.0)
MCH: 38.1 pg — ABNORMAL HIGH (ref 26.0–34.0)
MCV: 112.6 fL — ABNORMAL HIGH (ref 78.0–100.0)
Platelets: 227 10*3/uL (ref 150–400)
RDW: 20.4 % — ABNORMAL HIGH (ref 11.5–15.5)

## 2012-07-14 MED ORDER — MIDAZOLAM HCL 5 MG/5ML IJ SOLN
INTRAMUSCULAR | Status: AC | PRN
  Administered 2012-07-14: 1 mg via INTRAVENOUS

## 2012-07-14 MED ORDER — SODIUM CHLORIDE 0.9 % IV SOLN: INTRAVENOUS | Status: DC

## 2012-07-14 MED ORDER — FENTANYL CITRATE 0.05 MG/ML IJ SOLN
INTRAMUSCULAR | Status: AC | PRN
  Administered 2012-07-14: 100 ug via INTRAVENOUS

## 2012-07-14 NOTE — ED Notes (Signed)
Transient run of A fib rate 156 r/t wire/catheter placement.   Pt converted spontaneously to NSR w/o intervention.  Dr. Deanne Coffer aware.

## 2012-07-14 NOTE — Progress Notes (Signed)
CC:   Duke Salvia. Marcelle Overlie, M.D. Sandria Bales. Ezzard Standing, M.D.  DIAGNOSES: 1. 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 2. Stage IIB ductal carcinoma of the right breast.  CURRENT THERAPY: 1. Arixtra 7.5 mg subcu daily. 2. The patient is status post 6 cycles of adjuvant THC.  INTERIM HISTORY:  Ms. Maureen Duncan comes in for an unscheduled visit.  I wanted her to come in so I could look at her left arm.  She had her Port-A-Cath taken out I think a week ago.  She said of the arm got better after the Port-A-Cath got removed.  However, the arm then began to swell up again.  She is on Arixtra.  She says the left arm is improving a little bit.  She sees Interventional Radiology tomorrow.  They will do a venogram. There is an area of stenosis in the subclavian vein that they are going to I think try to open up.  They are not sure if they need to use thrombolytic therapy are not.  Otherwise, Ms. Campi is doing okay.  There has been no bleeding.  I want to check her for hypercoagulable studies.  I just want to make sure that she does not have anything underlying that is causing this.  She has had no problems with bowels or bladder.  There has been no leg swelling.  Her right arm is unremarkable.  PHYSICAL EXAMINATION:  Her vital signs are all stable.  Her blood pressure is 131/78, temperature is 98 and pulse is 91.  Lungs:  Clear bilaterally.  Cardiac:  Regular rate and rhythm with no murmurs, rubs or bruits.  Abdomen:  Soft with good bowel sounds.  There is no palpable abdominal mass.  No palpable hepatosplenomegaly.  Breasts:  The right chest wall is well-healed.  There is no right axillary adenopathy.  Left axilla is without any __________ lymph nodes.  Extremities:  Do show some 1+ nonpitting edema of the left arm.  She has good pulses in the radial artery.  Right arm is without edema.  She has no edema in her lower legs.  She has good range of motion of her joints.  Neurologic: No focal  neurological deficits.  IMPRESSION:  Ms. Maureen Duncan is a 61 year old African American female with stage IIB ductal carcinoma of the right breast.  She underwent mastectomy.  She had 3 positive lymph nodes.  She completed her adjuvant chemotherapy with TCH.  She is now dealing with this subclavian thrombus.  Her Port-A-Cath is removed.  I will be very interested to see what the venogram shows.  Hopefully, Interventional Radiology can help without having to do thrombolytic therapy and admitting her.  We still have to get Ms. Issac onto her "maintenance" Herceptin.  We will try to do this without a Port-A-Cath.  Ms. Wendel really does not want to have a Port-A-Cath.  We will see what her hypercoagulable studies show.    ______________________________ Josph Macho, M.D. PRE/MEDQ  D:  07/13/2012  T:  07/14/2012  Job:  [id number]

## 2012-07-14 NOTE — H&P (Signed)
Chief Complaint: (L)UE swelling Referring Physician:Ennever HPI: Maureen Duncan is an 61 y.o. female with hx of breast cancer who had port placed to left subclavian vein, and subsequent removal last month due to some swelling.  She had some improvement, but then developed recurrent(L)UE edema and is referred for eval and possible treatment. She has been on Arixtra, but not take today. She otherwise feels well, no recent fevers.  Past Medical History:  Past Medical History  Diagnosis Date  . Breast cancer 2000; 01/30/12    right    Past Surgical History:  Past Surgical History  Procedure Date  . Cystectomy 1996/1997  . Dental surgery 05/2011 thru 01/30/12    "getting implants"  . Mastectomy   . Modified radical mastectomy w/ axillary lymph node dissection 01/30/12    right  . Insertion central venous access device w/ subcutaneous port 01/30/12    PAC; left  . Breast lumpectomy 2000    right  . Tubal ligation 1979/1980  . Portacath placement 01/30/2012    Procedure: INSERTION PORT-A-CATH;  Surgeon: Maureen Cocking, MD;  Location: [facility] OR;  Service: General;  Laterality: Left;  . Port-a-cath removal 06/23/2012    Procedure: MINOR REMOVAL PORT-A-CATH;  Surgeon: Maureen Cocking, MD;  Location: [facility];  Service: General;  Laterality: Left;    Family History:  Family History  Problem Relation Age of Onset  . Diabetes Mother   . Hypertension Mother   . Kidney failure Mother   . Cancer Mother     pancreatic  . Other Brother     heart transplant  . Cancer Maternal Uncle     lung    Social History:  reports that she quit smoking about 8 months ago. Her smoking use included Cigarettes. She quit after 2 years of use. She has never used smokeless tobacco. She reports that she drinks about .6 ounces of alcohol per week. She reports that she does not use illicit drugs.  Allergies:  Allergies  Allergen Reactions  . Adhesive (Tape) Rash    Medications: Arixtra 7.5 mg  daily Ultram 50mg  q6h prn pain Ativan 0.5mg  every 6 hours prn anxiety Claritin daily Magic MW prn.  Please HPI for pertinent positives, otherwise complete 10 system ROS negative.  Physical Exam: Blood pressure 127/75, pulse 87, temperature 98.2 F (36.8 C), temperature source Oral, resp. rate 16, height 5\' 5"  (1.651 m), weight 160 lb (72.576 kg), SpO2 96.00%. Body mass index is 26.63 kg/(m^2).   General Appearance:  Alert, cooperative, no distress, appears stated age  Head:  Normocephalic, without obvious abnormality, atraumatic  ENT: Unremarkable  Neck: Supple, symmetrical, trachea midline, no adenopathy, thyroid: not enlarged, symmetric, no tenderness/mass/nodules  Lungs:   Clear to auscultation bilaterally, no w/r/r, respirations unlabored without use of accessory muscles.  Chest Wall:  No tenderness or deformity, healed port/removal scar.  Heart:  Regular rate and rhythm, S1, S2 normal, no murmur, rub or gallop. Carotids 2+ without bruit.  Abdomen:   Soft, non-tender, non distended. Bowel sounds active all four quadrants,  no masses, no organomegaly.  Extremities: (L)UE with 1-2+ nonpitting edema from upper arm to hand, Hand war, good radial pulse.  Neurologic: Normal affect, no gross deficits.   Results for orders placed during the hospital encounter of 07/14/12 (from the past 48 hour(s))  APTT     Status: Normal   Collection Time   07/14/12 10:11 AM      Component Value Range Comment   aPTT  35  24 - 37 seconds   BASIC METABOLIC PANEL     Status: Abnormal   Collection Time   07/14/12 10:11 AM      Component Value Range Comment   Sodium 140  135 - 145 mEq/L    Potassium 3.2 (*) 3.5 - 5.1 mEq/L    Chloride 102  96 - 112 mEq/L    CO2 28  19 - 32 mEq/L    Glucose, Bld 102 (*) 70 - 99 mg/dL    BUN 12  6 - 23 mg/dL    Creatinine, Ser 1.61  0.50 - 1.10 mg/dL    Calcium 9.4  8.4 - 09.6 mg/dL    GFR calc non Af Amer >90  >90 mL/min    GFR calc Af Amer >90  >90 mL/min   CBC      Status: Abnormal   Collection Time   07/14/12 10:11 AM      Component Value Range Comment   WBC 9.2  4.0 - 10.5 K/uL    RBC 2.39 (*) 3.87 - 5.11 MIL/uL    Hemoglobin 9.1 (*) 12.0 - 15.0 g/dL    HCT 04.5 (*) 40.9 - 46.0 %    MCV 112.6 (*) 78.0 - 100.0 fL    MCH 38.1 (*) 26.0 - 34.0 pg    MCHC 33.8  30.0 - 36.0 g/dL    RDW 81.1 (*) 91.4 - 15.5 %    Platelets 227  150 - 400 K/uL   PROTIME-INR     Status: Normal   Collection Time   07/14/12 10:11 AM      Component Value Range Comment   Prothrombin Time 13.4  11.6 - 15.2 seconds    INR 1.03  0.00 - 1.49    No results found.  Assessment/Plan (L)UE edema secondary to subclavian stenosis/occlusion. For (L)UE venogram, possible angioplasty, possible lysis Procedure explained in detail to pt, including risks and complications. Labs ok. Consent signed in chart.  Brayton El PA-C 07/14/2012, 11:37 AM

## 2012-07-14 NOTE — Procedures (Signed)
L arm venogram, subclavian vein 12mm PTA No complication No blood loss. See complete dictation in [facility].

## 2012-07-16 ENCOUNTER — Encounter (INDEPENDENT_AMBULATORY_CARE_PROVIDER_SITE_OTHER): Payer: Federal, State, Local not specified - PPO | Admitting: Surgery

## 2012-07-23 ENCOUNTER — Other Ambulatory Visit (HOSPITAL_BASED_OUTPATIENT_CLINIC_OR_DEPARTMENT_OTHER): Payer: Federal, State, Local not specified - PPO | Admitting: Lab

## 2012-07-23 ENCOUNTER — Ambulatory Visit (HOSPITAL_BASED_OUTPATIENT_CLINIC_OR_DEPARTMENT_OTHER): Payer: Federal, State, Local not specified - PPO

## 2012-07-23 ENCOUNTER — Ambulatory Visit (HOSPITAL_BASED_OUTPATIENT_CLINIC_OR_DEPARTMENT_OTHER): Payer: Federal, State, Local not specified - PPO | Admitting: Hematology & Oncology

## 2012-07-23 ENCOUNTER — Encounter: Payer: Self-pay | Admitting: Hematology & Oncology

## 2012-07-23 VITALS — BP 149/86 | HR 102 | Temp 98.0°F | Resp 18 | Ht 65.0 in | Wt 160.0 lb

## 2012-07-23 DIAGNOSIS — Z170 Estrogen receptor positive status [ER+]: Secondary | ICD-10-CM

## 2012-07-23 DIAGNOSIS — I82409 Acute embolism and thrombosis of unspecified deep veins of unspecified lower extremity: Secondary | ICD-10-CM

## 2012-07-23 DIAGNOSIS — C50519 Malignant neoplasm of lower-outer quadrant of unspecified female breast: Secondary | ICD-10-CM

## 2012-07-23 DIAGNOSIS — I82B19 Acute embolism and thrombosis of unspecified subclavian vein: Secondary | ICD-10-CM

## 2012-07-23 DIAGNOSIS — C773 Secondary and unspecified malignant neoplasm of axilla and upper limb lymph nodes: Secondary | ICD-10-CM

## 2012-07-23 DIAGNOSIS — C50919 Malignant neoplasm of unspecified site of unspecified female breast: Secondary | ICD-10-CM

## 2012-07-23 DIAGNOSIS — Z5112 Encounter for antineoplastic immunotherapy: Secondary | ICD-10-CM

## 2012-07-23 DIAGNOSIS — M810 Age-related osteoporosis without current pathological fracture: Secondary | ICD-10-CM

## 2012-07-23 LAB — CBC WITH DIFFERENTIAL (CANCER CENTER ONLY)
Eosinophils Absolute: 0 10*3/uL (ref 0.0–0.5)
HCT: 28.4 % — ABNORMAL LOW (ref 34.8–46.6)
LYMPH%: 26.2 % (ref 14.0–48.0)
MCH: 38.8 pg — ABNORMAL HIGH (ref 26.0–34.0)
MCV: 116 fL — ABNORMAL HIGH (ref 81–101)
MONO%: 12 % (ref 0.0–13.0)
NEUT%: 61.3 % (ref 39.6–80.0)
Platelets: 121 10*3/uL — ABNORMAL LOW (ref 145–400)
RDW: 15.8 % — ABNORMAL HIGH (ref 11.1–15.7)

## 2012-07-23 MED ORDER — SODIUM CHLORIDE 0.9 % IJ SOLN
10.0000 mL | INTRAMUSCULAR | Status: DC | PRN
  Filled 2012-07-23: qty 10

## 2012-07-23 MED ORDER — ACETAMINOPHEN 325 MG PO TABS
650.0000 mg | ORAL_TABLET | Freq: Once | ORAL | Status: AC
  Administered 2012-07-23: 650 mg via ORAL

## 2012-07-23 MED ORDER — TRASTUZUMAB CHEMO INJECTION 440 MG
6.0000 mg/kg | Freq: Once | INTRAVENOUS | Status: AC
  Administered 2012-07-23: 420 mg via INTRAVENOUS
  Filled 2012-07-23: qty 20

## 2012-07-23 MED ORDER — SODIUM CHLORIDE 0.9 % IV SOLN
Freq: Once | INTRAVENOUS | Status: AC
  Administered 2012-07-23: 10:00:00 via INTRAVENOUS

## 2012-07-23 MED ORDER — HEPARIN SOD (PORK) LOCK FLUSH 100 UNIT/ML IV SOLN
500.0000 [IU] | Freq: Once | INTRAVENOUS | Status: DC | PRN
  Filled 2012-07-23: qty 5

## 2012-07-23 MED ORDER — RIVAROXABAN 20 MG PO TABS
20.0000 mg | ORAL_TABLET | Freq: Every day | ORAL | Status: DC
Start: 2012-07-23 — End: 2012-10-17

## 2012-07-23 MED ORDER — DIPHENHYDRAMINE HCL 25 MG PO CAPS
50.0000 mg | ORAL_CAPSULE | Freq: Once | ORAL | Status: AC
  Administered 2012-07-23: 50 mg via ORAL

## 2012-07-23 NOTE — Patient Instructions (Signed)

## 2012-07-23 NOTE — Progress Notes (Signed)
This office note has been dictated.

## 2012-07-23 NOTE — Patient Instructions (Signed)
Call if bleeding or left arm is more swollen.

## 2012-07-23 NOTE — Progress Notes (Signed)
CC:   Sandria Bales. Ezzard Standing, M.D. Richard M. Marcelle Overlie, M.D.  DIAGNOSES: 1. Stage IIB (T2 N1 M0) ductal carcinoma of the right breast. 2. Deep venous thrombosis of the left subclavian vein.  CURRENT THERAPY: 1. Patient to start maintenance Herceptin q.3 weeks. 2. Arixtra 7.5 mg p.o. daily (patient to start Xarelto 20 mg p.o.     daily in 1 week).  INTERIM HISTORY:  Maureen Duncan comes in for followup.  She was seen by Interventional Radiology.  They did an angioplasty of the right subclavian vein.  This helped incredibly well.  She had marked decrease in the swelling in her left arm.  She is on Arixtra right now.  She started this back on August 23rd.  She had her Port-A-Cath removed by Dr. Ezzard Standing.  I believe that she will need 3 months of anticoagulation.  I will get her on Xarelto after she finishes were box of Arixtra.  We have to get going with her Herceptin.  Her tumor is HER-2 positive. She completed her 6 cycles of THC over a month ago.  We had the DVT issues that we had to deal with.  These are now resolved.  She feels well.  She is not as fatigued.  She has had no cough or shortness of breath.  She has had no leg swelling.  The swelling in her left arm has resolved quite nicely.  PHYSICAL EXAMINATION:  This is a well-developed, well-nourished [place] American female in no obvious distress.  Vital signs:  Temperature of 98, pulse 102, respiratory rate 18, blood pressure 149/86.  Weight is 161.  Head and neck:  Normocephalic, atraumatic skull.  There are no ocular or oral lesions.  There are no palpable cervical or supraclavicular lymph nodes.  Lungs:  Clear bilaterally.  Cardiac: Regular rate and rhythm with a normal S1, S2.  There are no murmurs, rubs or bruits.  Abdomen:  Soft with good bowel sounds.  There is no palpable abdominal mass.  There is no fluid wave.  There is no palpable hepatosplenomegaly.  Back:  No tenderness over the spine, ribs, or hips. Extremities:  Shows  some mild nonpitting edema of the left upper arm. There is no plethora of the left upper arm.  Right arm has no edema from her mastectomy and lymph node dissection.  Skin:  No rashes, ecchymosis or petechia.  LABORATORY STUDIES:  White cell count is 6.1, hemoglobin 9.5, hematocrit 28.4, platelet count 121.  Hypercoagulable panel shows no hypercoagulable issues.  IMPRESSION:  Maureen Duncan is a 61 year old African American female with stage IIB ductal carcinoma of the right breast.  She underwent mastectomy.  She had 3 positive lymph nodes.  Her tumor is ER/PR negative but HER-2 positive.  Again, she will start the Herceptin now.  We will do the Herceptin every 3 weeks.  One thought that I had was the possibility of giving her Zometa every 6 months.  This may not be a bad idea to try to help decrease bony events.  I will think about this later.  I want to just start Herceptin now and not add too much to her.  I will have her come back in 3 weeks for followup.    ______________________________ Josph Macho, M.D. PRE/MEDQ  D:  07/23/2012  T:  07/23/2012  Job:  [id number]

## 2012-07-29 ENCOUNTER — Encounter (INDEPENDENT_AMBULATORY_CARE_PROVIDER_SITE_OTHER): Payer: Federal, State, Local not specified - PPO | Admitting: Surgery

## 2012-08-14 ENCOUNTER — Ambulatory Visit (HOSPITAL_BASED_OUTPATIENT_CLINIC_OR_DEPARTMENT_OTHER): Payer: Federal, State, Local not specified - PPO

## 2012-08-14 ENCOUNTER — Other Ambulatory Visit (HOSPITAL_BASED_OUTPATIENT_CLINIC_OR_DEPARTMENT_OTHER): Payer: Federal, State, Local not specified - PPO | Admitting: Lab

## 2012-08-14 ENCOUNTER — Ambulatory Visit (HOSPITAL_BASED_OUTPATIENT_CLINIC_OR_DEPARTMENT_OTHER): Payer: Federal, State, Local not specified - PPO | Admitting: Hematology & Oncology

## 2012-08-14 VITALS — BP 132/92 | HR 72 | Temp 98.2°F | Resp 16 | Ht 65.0 in | Wt 158.0 lb

## 2012-08-14 DIAGNOSIS — C50919 Malignant neoplasm of unspecified site of unspecified female breast: Secondary | ICD-10-CM

## 2012-08-14 DIAGNOSIS — C50519 Malignant neoplasm of lower-outer quadrant of unspecified female breast: Secondary | ICD-10-CM

## 2012-08-14 DIAGNOSIS — I82409 Acute embolism and thrombosis of unspecified deep veins of unspecified lower extremity: Secondary | ICD-10-CM

## 2012-08-14 DIAGNOSIS — C773 Secondary and unspecified malignant neoplasm of axilla and upper limb lymph nodes: Secondary | ICD-10-CM

## 2012-08-14 DIAGNOSIS — M810 Age-related osteoporosis without current pathological fracture: Secondary | ICD-10-CM

## 2012-08-14 DIAGNOSIS — I82B19 Acute embolism and thrombosis of unspecified subclavian vein: Secondary | ICD-10-CM

## 2012-08-14 DIAGNOSIS — Z5112 Encounter for antineoplastic immunotherapy: Secondary | ICD-10-CM

## 2012-08-14 LAB — CBC WITH DIFFERENTIAL (CANCER CENTER ONLY)
BASO#: 0 10*3/uL (ref 0.0–0.2)
EOS%: 1.3 % (ref 0.0–7.0)
HCT: 32.6 % — ABNORMAL LOW (ref 34.8–46.6)
HGB: 11.2 g/dL — ABNORMAL LOW (ref 11.6–15.9)
LYMPH#: 1.5 10*3/uL (ref 0.9–3.3)
MCH: 37.2 pg — ABNORMAL HIGH (ref 26.0–34.0)
MCHC: 34.4 g/dL (ref 32.0–36.0)
MCV: 108 fL — ABNORMAL HIGH (ref 81–101)
MONO%: 9.7 % (ref 0.0–13.0)
NEUT%: 50.1 % (ref 39.6–80.0)

## 2012-08-14 MED ORDER — DIPHENHYDRAMINE HCL 25 MG PO CAPS
50.0000 mg | ORAL_CAPSULE | Freq: Once | ORAL | Status: AC
  Administered 2012-08-14: 50 mg via ORAL

## 2012-08-14 MED ORDER — TRASTUZUMAB CHEMO INJECTION 440 MG
6.0000 mg/kg | Freq: Once | INTRAVENOUS | Status: AC
  Administered 2012-08-14: 420 mg via INTRAVENOUS
  Filled 2012-08-14: qty 20

## 2012-08-14 MED ORDER — SODIUM CHLORIDE 0.9 % IV SOLN
Freq: Once | INTRAVENOUS | Status: AC
  Administered 2012-08-14: 11:00:00 via INTRAVENOUS

## 2012-08-14 MED ORDER — ACETAMINOPHEN 325 MG PO TABS
650.0000 mg | ORAL_TABLET | Freq: Once | ORAL | Status: AC
  Administered 2012-08-14: 650 mg via ORAL

## 2012-08-14 NOTE — Progress Notes (Signed)
This office note has been dictated.

## 2012-08-14 NOTE — Progress Notes (Signed)
CC:   Duke Salvia. Marcelle Overlie, M.D. Sandria Bales. Ezzard Standing, M.D.  DIAGNOSES: 1. Stage IIB (T2 N1 M0) ductal carcinoma of the right breast. 2. Deep venous thrombosis of the left subclavian vein.  CURRENT THERAPY: 1. Maintenance Herceptin every 3 weeks. 2. Xarelto 20 mg p.o. daily.  INTERIM HISTORY:  [name] comes in for followup.  She is feeling well. Her left arm is doing better.  She has finished up her Arixtra and is now going on to Xarelto.  She has had no problems with the Herceptin.  We did start her on maintenance Herceptin 3 weeks go.  There has been no cough or shortness breath.  She has had no nausea or vomiting.  She has had no fevers, sweats or chills.  There has been no leg swelling.  PHYSICAL EXAMINATION:  This is a well-developed, well-nourished black female in no obvious distress.  Vital Sign:  98.2, pulse 72, respiratory rate 16, blood pressure 132/92.  Weight is 158.  Head and neck: Normocephalic, atraumatic skull.  There are no ocular or oral lesions. There are no palpable cervical or supraclavicular lymph nodes.  Lungs: Clear bilaterally.  Cardiac:  Regular rate and rhythm with a normal S1, S2.  There are no murmurs, rubs or bruits.  Breasts:  Left breast with no masses, edema or erythema.  There is no left axillary adenopathy. Right chest wall shows a well-healed mastectomy.  She has some hyperpigmentation from past radiation therapy.  There is a lot of firmness on the right anterior chest wall.  There is no right axillary adenopathy.  Back:  No tenderness of the spine, ribs, or hips. Extremities:  No clubbing, cyanosis or edema.  Neurologic:  No focal neurological deficits.  LABORATORY STUDIES:  White cell count 3.8, hemoglobin 11.2, hematocrit 32.6, platelet count 257.  IMPRESSION:  Ms. Maureen Duncan is a 61 year old African American female with a history of stage IIB ductal carcinoma of the right breast.  She underwent mastectomy.  She had 3 positive lymph nodes.  Her  tumor is ER negative but HER-2 positive.  She is on maintenance Herceptin.  She also has a DVT of the left subclavian vein.  This may be related to her having a Port-A-Cath.  She also was found have a stenosis within the left subclavian vein.  For now, we will continue her on the Herceptin.  I think it might be reasonable to give her some Zometa.  I think that she may benefit from this, given the aggressive nature of her breast cancer.  We may be able to decrease bone-related events.  We will get her back to see [place] in 6 weeks.  She will come back in 3 weeks for Herceptin.    ______________________________ Josph Macho, M.D. PRE/MEDQ  D:  08/14/2012  T:  08/14/2012  Job:  [id number]

## 2012-08-15 LAB — COMPREHENSIVE METABOLIC PANEL
AST: 21 U/L (ref 0–37)
BUN: 13 mg/dL (ref 6–23)
Calcium: 9.7 mg/dL (ref 8.4–10.5)
Chloride: 105 mEq/L (ref 96–112)
Creatinine, Ser: 0.68 mg/dL (ref 0.50–1.10)

## 2012-08-19 ENCOUNTER — Encounter (INDEPENDENT_AMBULATORY_CARE_PROVIDER_SITE_OTHER): Payer: Federal, State, Local not specified - PPO | Admitting: Surgery

## 2012-08-24 ENCOUNTER — Ambulatory Visit (HOSPITAL_COMMUNITY)
Admission: RE | Admit: 2012-08-24 | Discharge: 2012-08-24 | Disposition: A | Discharge status: Home / Self Care | Payer: Federal, State, Local not specified - PPO | Source: Ambulatory Visit | Attending: Hematology & Oncology | Admitting: Hematology & Oncology

## 2012-08-24 DIAGNOSIS — C50919 Malignant neoplasm of unspecified site of unspecified female breast: Secondary | ICD-10-CM | POA: Insufficient documentation

## 2012-08-24 DIAGNOSIS — Z09 Encounter for follow-up examination after completed treatment for conditions other than malignant neoplasm: Secondary | ICD-10-CM

## 2012-08-24 HISTORY — PX: TRANSTHORACIC ECHOCARDIOGRAM: SHX275

## 2012-08-24 NOTE — Progress Notes (Signed)
Echocardiogram 2D Echocardiogram has been performed.  Zyen Triggs 08/24/2012, 10:55 AM

## 2012-08-26 ENCOUNTER — Telehealth: Payer: Self-pay | Admitting: *Deleted

## 2012-08-26 NOTE — Telephone Encounter (Signed)
Called patient to let her know that her heart is pumping great per dr. Myna Hidalgo

## 2012-08-26 NOTE — Telephone Encounter (Signed)
Message copied by Anselm Jungling on Wed Aug 26, 2012 10:58 AM ------      Message from: Josph Macho      Created: Tue Aug 25, 2012  7:36 AM       Call - her heart is still pumping like a champ!!!  Cindee Lame

## 2012-09-01 ENCOUNTER — Encounter (INDEPENDENT_AMBULATORY_CARE_PROVIDER_SITE_OTHER): Payer: Self-pay | Admitting: Surgery

## 2012-09-03 ENCOUNTER — Ambulatory Visit (HOSPITAL_BASED_OUTPATIENT_CLINIC_OR_DEPARTMENT_OTHER): Payer: Federal, State, Local not specified - PPO | Admitting: Hematology & Oncology

## 2012-09-03 ENCOUNTER — Ambulatory Visit (HOSPITAL_BASED_OUTPATIENT_CLINIC_OR_DEPARTMENT_OTHER): Payer: Federal, State, Local not specified - PPO

## 2012-09-03 ENCOUNTER — Other Ambulatory Visit (HOSPITAL_BASED_OUTPATIENT_CLINIC_OR_DEPARTMENT_OTHER): Payer: Federal, State, Local not specified - PPO | Admitting: Lab

## 2012-09-03 VITALS — BP 137/90 | HR 70 | Temp 97.9°F | Resp 16 | Ht 65.0 in | Wt 156.0 lb

## 2012-09-03 DIAGNOSIS — C773 Secondary and unspecified malignant neoplasm of axilla and upper limb lymph nodes: Secondary | ICD-10-CM

## 2012-09-03 DIAGNOSIS — C50919 Malignant neoplasm of unspecified site of unspecified female breast: Secondary | ICD-10-CM

## 2012-09-03 DIAGNOSIS — C50519 Malignant neoplasm of lower-outer quadrant of unspecified female breast: Secondary | ICD-10-CM

## 2012-09-03 DIAGNOSIS — Z5112 Encounter for antineoplastic immunotherapy: Secondary | ICD-10-CM

## 2012-09-03 DIAGNOSIS — E559 Vitamin D deficiency, unspecified: Secondary | ICD-10-CM

## 2012-09-03 DIAGNOSIS — I82B19 Acute embolism and thrombosis of unspecified subclavian vein: Secondary | ICD-10-CM

## 2012-09-03 DIAGNOSIS — Z171 Estrogen receptor negative status [ER-]: Secondary | ICD-10-CM

## 2012-09-03 LAB — CBC WITH DIFFERENTIAL (CANCER CENTER ONLY)
BASO#: 0 10*3/uL (ref 0.0–0.2)
BASO%: 0.2 % (ref 0.0–2.0)
EOS%: 0.5 % (ref 0.0–7.0)
LYMPH#: 1.6 10*3/uL (ref 0.9–3.3)
MCH: 34.9 pg — ABNORMAL HIGH (ref 26.0–34.0)
MCHC: 33.7 g/dL (ref 32.0–36.0)
MONO%: 8.9 % (ref 0.0–13.0)
NEUT#: 2.2 10*3/uL (ref 1.5–6.5)
Platelets: 233 10*3/uL (ref 145–400)
RDW: 12.4 % (ref 11.1–15.7)

## 2012-09-03 LAB — COMPREHENSIVE METABOLIC PANEL
ALT: 12 U/L (ref 0–35)
AST: 18 U/L (ref 0–37)
Albumin: 4 g/dL (ref 3.5–5.2)
Alkaline Phosphatase: 47 U/L (ref 39–117)
BUN: 14 mg/dL (ref 6–23)
Chloride: 105 mEq/L (ref 96–112)
Potassium: 3.9 mEq/L (ref 3.5–5.3)
Sodium: 141 mEq/L (ref 135–145)

## 2012-09-03 MED ORDER — TRASTUZUMAB CHEMO INJECTION 440 MG
6.0000 mg/kg | Freq: Once | INTRAVENOUS | Status: AC
  Administered 2012-09-03: 420 mg via INTRAVENOUS
  Filled 2012-09-03: qty 20

## 2012-09-03 MED ORDER — ACETAMINOPHEN 325 MG PO TABS
650.0000 mg | ORAL_TABLET | Freq: Once | ORAL | Status: AC
  Administered 2012-09-03: 650 mg via ORAL

## 2012-09-03 MED ORDER — DIPHENHYDRAMINE HCL 25 MG PO CAPS
50.0000 mg | ORAL_CAPSULE | Freq: Once | ORAL | Status: AC
  Administered 2012-09-03: 50 mg via ORAL

## 2012-09-03 MED ORDER — SODIUM CHLORIDE 0.9 % IV SOLN
Freq: Once | INTRAVENOUS | Status: AC
  Administered 2012-09-03: 11:00:00 via INTRAVENOUS

## 2012-09-03 MED ORDER — ZOLEDRONIC ACID 4 MG/5ML IV CONC
4.0000 mg | Freq: Once | INTRAVENOUS | Status: AC
  Administered 2012-09-03: 4 mg via INTRAVENOUS
  Filled 2012-09-03: qty 5

## 2012-09-03 NOTE — Patient Instructions (Signed)

## 2012-09-03 NOTE — Progress Notes (Signed)
This office note has been dictated.

## 2012-09-04 NOTE — Progress Notes (Signed)
CC:   Duke Salvia. Marcelle Overlie, M.D. Sandria Bales. Ezzard Standing, M.D.  DIAGNOSES: 1. Stage IIB (T2 N1 N0) ductal carcinoma of the right breast. 2. Deep venous thrombosis (DVT) of the left subclavian vein.  CURRENT THERAPY: 1. Herceptin q.3 week dosing. 2. Xarelto 20 mg p.o. daily.  INTERIM HISTORY:  Ms. Bozard comes in for followup.  She is doing quite well.  We did go ahead and repeat an echocardiogram on her.  The echocardiogram showed that her ejection fraction was 45 to 50%, which is stable.  Her left arm has resolved with its swelling.  She has some lymphedema in the right arm.  She is on Xarelto.  There is no bleeding with the Xarelto.  There is no problem with bowels or bladder.  She has had no leg issues. There have been no rashes.  She has had no nausea or vomiting.  PHYSICAL EXAMINATION:  General:  This is a well-developed, well- nourished black female, in no obvious distress.  Vital signs: Temperature of 97.9, pulse 70, respiratory rate 16, blood pressure 137/90.  Weight is 156.  Head and neck:  Normocephalic, atraumatic skull.  There are no ocular or oral lesions.  There are no palpable cervical or supraclavicular lymph nodes.  Lungs:  Clear bilaterally. Cardiac:  Regular rate and rhythm with a normal S1, S2.  There are no murmurs, rubs, or bruits.  Abdomen:  Soft, with good bowel sounds. There is no palpable abdominal mass.  There is no fluid wave.  There is no palpable hepatosplenomegaly.  Extremities:  Show no clubbing, cyanosis, or edema.  There is some lymphedema in the right arm, which is stable.  LABORATORY STUDIES:  White cell count is 4.3, hemoglobin 11.9, hematocrit 35.3, platelet count 233,000.  IMPRESSION:  Ms. Vanderbeck is a very charming 60 year old African American female with stage IIB ductal carcinoma of the right breast.  She underwent mastectomy.  She had 3 positive lymph nodes.  Her tumor is ER negative, but HER-2 positive.  She will continue on maintenance  Herceptin right now.  Her echocardiogram looks good.  We will repeat echocardiogram in 3 months.  The Xarelto has really helped with the DVT in the left subclavian vein. She did have this angioplastied by Interventional Radiology.  Her Port-A- Cath was removed.  She will be more diligent with wearing her lymphedema sleeve for the right arm.  We will go ahead with her Herceptin today.  I want to go ahead and get her back in 4 weeks.  We will work around the Thanksgiving holiday.    ______________________________ Josph Macho, M.D. PRE/MEDQ  D:  09/03/2012  T:  09/04/2012  Job:  [id number]

## 2012-09-23 ENCOUNTER — Other Ambulatory Visit: Payer: Federal, State, Local not specified - PPO | Admitting: Lab

## 2012-09-23 ENCOUNTER — Ambulatory Visit: Payer: Federal, State, Local not specified - PPO

## 2012-09-23 ENCOUNTER — Ambulatory Visit: Payer: Federal, State, Local not specified - PPO | Admitting: Medical

## 2012-09-30 ENCOUNTER — Other Ambulatory Visit (HOSPITAL_BASED_OUTPATIENT_CLINIC_OR_DEPARTMENT_OTHER): Payer: Federal, State, Local not specified - PPO | Admitting: Lab

## 2012-09-30 ENCOUNTER — Ambulatory Visit (HOSPITAL_BASED_OUTPATIENT_CLINIC_OR_DEPARTMENT_OTHER): Payer: Federal, State, Local not specified - PPO | Admitting: Medical

## 2012-09-30 ENCOUNTER — Ambulatory Visit (HOSPITAL_BASED_OUTPATIENT_CLINIC_OR_DEPARTMENT_OTHER): Payer: Federal, State, Local not specified - PPO

## 2012-09-30 VITALS — BP 137/80 | HR 80 | Temp 98.0°F | Resp 16 | Ht 65.0 in | Wt 153.0 lb

## 2012-09-30 DIAGNOSIS — C50919 Malignant neoplasm of unspecified site of unspecified female breast: Secondary | ICD-10-CM

## 2012-09-30 DIAGNOSIS — C50519 Malignant neoplasm of lower-outer quadrant of unspecified female breast: Secondary | ICD-10-CM

## 2012-09-30 DIAGNOSIS — C773 Secondary and unspecified malignant neoplasm of axilla and upper limb lymph nodes: Secondary | ICD-10-CM

## 2012-09-30 DIAGNOSIS — Z5112 Encounter for antineoplastic immunotherapy: Secondary | ICD-10-CM

## 2012-09-30 DIAGNOSIS — I890 Lymphedema, not elsewhere classified: Secondary | ICD-10-CM

## 2012-09-30 DIAGNOSIS — I82409 Acute embolism and thrombosis of unspecified deep veins of unspecified lower extremity: Secondary | ICD-10-CM

## 2012-09-30 LAB — LACTATE DEHYDROGENASE: LDH: 158 U/L (ref 94–250)

## 2012-09-30 LAB — CBC WITH DIFFERENTIAL (CANCER CENTER ONLY)
Eosinophils Absolute: 0 10*3/uL (ref 0.0–0.5)
HCT: 35.1 % (ref 34.8–46.6)
LYMPH%: 38.3 % (ref 14.0–48.0)
MCV: 99 fL (ref 81–101)
MONO#: 0.4 10*3/uL (ref 0.1–0.9)
Platelets: 216 10*3/uL (ref 145–400)
RBC: 3.53 10*6/uL — ABNORMAL LOW (ref 3.70–5.32)
WBC: 4.8 10*3/uL (ref 3.9–10.0)

## 2012-09-30 LAB — COMPREHENSIVE METABOLIC PANEL
Albumin: 4.2 g/dL (ref 3.5–5.2)
CO2: 26 mEq/L (ref 19–32)
Calcium: 9.7 mg/dL (ref 8.4–10.5)
Chloride: 103 mEq/L (ref 96–112)
Glucose, Bld: 124 mg/dL — ABNORMAL HIGH (ref 70–99)
Sodium: 142 mEq/L (ref 135–145)
Total Bilirubin: 0.5 mg/dL (ref 0.3–1.2)
Total Protein: 6.6 g/dL (ref 6.0–8.3)

## 2012-09-30 MED ORDER — TRASTUZUMAB CHEMO INJECTION 440 MG
6.0000 mg/kg | Freq: Once | INTRAVENOUS | Status: AC
  Administered 2012-09-30: 420 mg via INTRAVENOUS
  Filled 2012-09-30: qty 20

## 2012-09-30 MED ORDER — DIPHENHYDRAMINE HCL 25 MG PO CAPS
50.0000 mg | ORAL_CAPSULE | Freq: Once | ORAL | Status: AC
  Administered 2012-09-30: 50 mg via ORAL

## 2012-09-30 MED ORDER — SODIUM CHLORIDE 0.9 % IV SOLN
Freq: Once | INTRAVENOUS | Status: AC
  Administered 2012-09-30: 13:00:00 via INTRAVENOUS

## 2012-09-30 MED ORDER — ACETAMINOPHEN 325 MG PO TABS
650.0000 mg | ORAL_TABLET | Freq: Once | ORAL | Status: AC
  Administered 2012-09-30: 650 mg via ORAL

## 2012-09-30 NOTE — Progress Notes (Signed)
Diagnoses: #1   Stage IIb (T2, N1, M0) ductal carcinoma.  The right breast. #2.  Deep venous thrombosis of the left subclavian vein.  #3. Lymphedema involving the right hand.  Current therapy: #1, Herceptin every three-week dosing. #2  Xarelto  20 mg by mouth daily.  Interim history:  Maureen Duncan presents today for an office followup visit.  Overall, she, reports, that she's doing quite well.  We did repeat an echocardiogram on her.  Echo showed that her ejection fraction was 45-50%, which is stable.  The swelling in her left arm from the subclavian DVT, has resolved.  She does have lymphedema of the right arm for which she wears a sleeve.  She remains on Xarelto without any bleeding complications.  She reports, that she has a good appetite.  She denies any nausea, vomiting, diarrhea, constipation, chest pain, shortness of breath, or cough.  She denies any palpable adenopathy.  She denies any fevers, chills, or night sweats.  She denies any lower leg swelling.  She denies any headaches, visual changes, or rashes.  Overall Maureen Duncan is doing remarkably well.  Review of Systems: Constitutional:Negative for malaise/fatigue, fever, chills, weight loss, diaphoresis, activity change, appetite change, and unexpected weight change.  HEENT: Negative for double vision, blurred vision, visual loss, ear pain, tinnitus, congestion, rhinorrhea, epistaxis sore throat or sinus disease, oral pain/lesion, tongue soreness Respiratory: Negative for cough, chest tightness, shortness of breath, wheezing and stridor.  Cardiovascular: Negative for chest pain, palpitations, leg swelling, orthopnea, PND, DOE or claudication Gastrointestinal: Negative for nausea, vomiting, abdominal pain, diarrhea, constipation, blood in stool, melena, hematochezia, abdominal distention, anal bleeding, rectal pain, anorexia and hematemesis.  Genitourinary: Negative for dysuria, frequency, hematuria,  Musculoskeletal: Negative for myalgias,  back pain, joint swelling, arthralgias and gait problem.  Skin: Negative for rash, color change, pallor and wound.  Neurological:. Negative for dizziness/light-headedness, tremors, seizures, syncope, facial asymmetry, speech difficulty, weakness, numbness, headaches and paresthesias.  Hematological: Negative for adenopathy. Does not bruise/bleed easily.  Psychiatric/Behavioral:  Negative for depression, no loss of interest in normal activity or change in sleep pattern.   Physical Exam: This is a pleasant, 61 year old, well-developed, well-nourished, African American female, in no obvious distress Vitals: Temperature 90.0 degrees, pulse 80, respirations 16, blood pressure 137/80.  Weight 153 pounds HEENT reveals a normocephalic, atraumatic skull, no scleral icterus, no oral lesions  Neck is supple without any cervical or supraclavicular adenopathy.  Lungs are clear to auscultation bilaterally. There are no wheezes, rales or rhonci Cardiac is regular rate and rhythm with a normal S1 and S2. There are no murmurs, rubs, or bruits.  Abdomen is soft with good bowel sounds, there is no palpable mass. There is no palpable hepatosplenomegaly. There is no palpable fluid wave.  Musculoskeletal no tenderness of the spine, ribs, or hips.  Extremities there are no clubbing, cyanosis, or edema.  Skin no petechia, purpura or ecchymosis Neurologic is nonfocal.  Laboratory Data: White count 4.8, hemoglobin 11.6, hematocrit 35.1, platelets 216,000  Current Outpatient Prescriptions on File Prior to Visit  Medication Sig Dispense Refill  . Alum & Mag Hydroxide-Simeth (MAGIC MOUTHWASH W/LIDOCAINE) SOLN Take 5 mLs by mouth 4 (four) times daily as needed. Swish and Spit.  240 mL  1  . benzonatate (TESSALON) 100 MG capsule Take 100 mg by mouth 2 (two) times daily as needed. Cough      . diphenhydrAMINE (BENADRYL) 25 MG tablet Take 25 mg by mouth every 6 (six) hours as needed.      Marland Kitchen  loratadine (CLARITIN) 10 MG  tablet Take 1 tablet (10 mg total) by mouth daily.  30 tablet  11  . LORazepam (ATIVAN) 0.5 MG tablet 0.5 mg every 6 (six) hours as needed.       . Rivaroxaban (XARELTO) 20 MG TABS Take 1 tablet (20 mg total) by mouth daily.  30 tablet  2  . traMADol (ULTRAM) 50 MG tablet every 8 (eight) hours as needed.         Assessment/Plan: This is a pleasant, 61 year old, African American, female, with the following issues:  #1.  Stage IIb ductal carcinoma of the right breast.  She underwent a mastectomy.  She had 3 positive lymph nodes.  Her tumor is ER negative, but HER-2 positive.  She will continue on maintenance Herceptin.  Her Echocardiogram looked good.  We will repeat an echocardiogram at the end of January.  #2.  DVT of the left subclavian vein.  She did have angioplasty by interventional radiology.  Her Port-A-Cath was removed. She remains on Xarelto without any complications.  #3.  Lymphedema involving the right hand.  She did followup at the lymphedema clinic.  She does wear a lymphedema sleeve.   #4.  Followup.  Maureen Duncan will follow back up with [place] in 3 weeks, but before then should there be questions or concerns.

## 2012-09-30 NOTE — Patient Instructions (Signed)

## 2012-10-15 ENCOUNTER — Ambulatory Visit: Payer: Federal, State, Local not specified - PPO

## 2012-10-15 ENCOUNTER — Other Ambulatory Visit: Payer: Federal, State, Local not specified - PPO | Admitting: Lab

## 2012-10-15 ENCOUNTER — Ambulatory Visit: Payer: Federal, State, Local not specified - PPO | Admitting: Hematology & Oncology

## 2012-10-17 ENCOUNTER — Other Ambulatory Visit: Payer: Self-pay | Admitting: Hematology & Oncology

## 2012-10-22 ENCOUNTER — Other Ambulatory Visit (HOSPITAL_BASED_OUTPATIENT_CLINIC_OR_DEPARTMENT_OTHER): Payer: Federal, State, Local not specified - PPO | Admitting: Lab

## 2012-10-22 ENCOUNTER — Ambulatory Visit (HOSPITAL_BASED_OUTPATIENT_CLINIC_OR_DEPARTMENT_OTHER): Payer: Federal, State, Local not specified - PPO | Admitting: Hematology & Oncology

## 2012-10-22 ENCOUNTER — Ambulatory Visit (HOSPITAL_BASED_OUTPATIENT_CLINIC_OR_DEPARTMENT_OTHER): Payer: Federal, State, Local not specified - PPO

## 2012-10-22 VITALS — BP 144/83 | HR 75 | Temp 97.6°F | Resp 16 | Ht 65.0 in | Wt 156.0 lb

## 2012-10-22 DIAGNOSIS — C773 Secondary and unspecified malignant neoplasm of axilla and upper limb lymph nodes: Secondary | ICD-10-CM

## 2012-10-22 DIAGNOSIS — C50519 Malignant neoplasm of lower-outer quadrant of unspecified female breast: Secondary | ICD-10-CM

## 2012-10-22 DIAGNOSIS — C50919 Malignant neoplasm of unspecified site of unspecified female breast: Secondary | ICD-10-CM

## 2012-10-22 DIAGNOSIS — Z5112 Encounter for antineoplastic immunotherapy: Secondary | ICD-10-CM

## 2012-10-22 LAB — CBC WITH DIFFERENTIAL (CANCER CENTER ONLY)
BASO%: 0.2 % (ref 0.0–2.0)
Eosinophils Absolute: 0 10*3/uL (ref 0.0–0.5)
HCT: 34.8 % (ref 34.8–46.6)
LYMPH#: 2 10*3/uL (ref 0.9–3.3)
MONO#: 0.4 10*3/uL (ref 0.1–0.9)
NEUT%: 53.4 % (ref 39.6–80.0)
Platelets: 240 10*3/uL (ref 145–400)
RBC: 3.56 10*6/uL — ABNORMAL LOW (ref 3.70–5.32)
RDW: 13.6 % (ref 11.1–15.7)
WBC: 5.4 10*3/uL (ref 3.9–10.0)

## 2012-10-22 LAB — COMPREHENSIVE METABOLIC PANEL
ALT: 12 U/L (ref 0–35)
CO2: 27 mEq/L (ref 19–32)
Calcium: 10.1 mg/dL (ref 8.4–10.5)
Chloride: 105 mEq/L (ref 96–112)
Glucose, Bld: 91 mg/dL (ref 70–99)
Sodium: 140 mEq/L (ref 135–145)
Total Protein: 6.7 g/dL (ref 6.0–8.3)

## 2012-10-22 LAB — LACTATE DEHYDROGENASE: LDH: 194 U/L (ref 94–250)

## 2012-10-22 MED ORDER — ACETAMINOPHEN 325 MG PO TABS
650.0000 mg | ORAL_TABLET | Freq: Once | ORAL | Status: AC
  Administered 2012-10-22: 650 mg via ORAL

## 2012-10-22 MED ORDER — DIPHENHYDRAMINE HCL 25 MG PO CAPS
50.0000 mg | ORAL_CAPSULE | Freq: Once | ORAL | Status: AC
  Administered 2012-10-22: 50 mg via ORAL

## 2012-10-22 MED ORDER — SODIUM CHLORIDE 0.9 % IV SOLN
Freq: Once | INTRAVENOUS | Status: AC
  Administered 2012-10-22: 13:00:00 via INTRAVENOUS

## 2012-10-22 MED ORDER — TRASTUZUMAB CHEMO INJECTION 440 MG
6.0000 mg/kg | Freq: Once | INTRAVENOUS | Status: AC
  Administered 2012-10-22: 420 mg via INTRAVENOUS
  Filled 2012-10-22: qty 20

## 2012-10-22 NOTE — Progress Notes (Signed)
This office note has been dictated.

## 2012-10-22 NOTE — Patient Instructions (Signed)

## 2012-10-23 NOTE — Progress Notes (Signed)
CC:   Duke Salvia. Marcelle Overlie, M.D. Sandria Bales. Ezzard Standing, M.D.  DIAGNOSES: 1. Stage IIB (T2 N1 M0) ductal carcinoma of the right breast. 2. 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  CURRENT THERAPY: 1. Maintenance Herceptin q.3 week dosing. 2. Xarelto 20 mg p.o. daily.  INTERIM HISTORY:  Ms. Groom comes in for her followup.  She has really done well.  Her hair is coming back.  She had a great Christmas.  She is looking forward to next year.  She has had no problems with the Xarelto.  There has been no swelling of the left arm.  She has had no change in bowel or bladder habits.  There has been no leg swelling.  PHYSICAL EXAMINATION:  This is a well-developed, well-nourished black female in no obvious distress.  Vital signs:  Temperature of 97.6, pulse 75, respiratory rate 16, blood pressure 144/83.  Weight is 156.  Head and neck:  Normocephalic, atraumatic skull.  There are no ocular or oral lesions.  There are no palpable cervical or supraclavicular lymph nodes. Lungs:  Clear bilaterally.  Cardiac:  Regular rate and rhythm with a normal S1 and S2.  There are no murmurs, rubs or bruits.  Abdomen:  Soft with good bowel sounds.  There is no palpable abdominal mass.  No palpable hepatosplenomegaly.  Breasts:  Left breast with no masses, edema or erythema.  There is no left axillary adenopathy.  Right chest wall shows a well-healed mastectomy.  Some hyperpigmentation is noted on the right anterior chest wall.  The mastectomy site is well healed. There is no erythema.  There is no nodularity.  There is no right axillary adenopathy.  Back:  No tenderness over the spine, ribs, or hips.  Extremities:  No clubbing, cyanosis or edema.  Skin:  No rashes, ecchymoses or petechiae.  LABORATORY STUDIES:  White cell count is 5.4, hemoglobin 11.3, hematocrit 34.8, platelet count 240.  IMPRESSION:  Ms. Marinello is a very nice 61 year old African American female with a second breast primary.  She  now has a stage IIB infiltrating ductal carcinoma of the right breast.  She had a prior breast cancer on the right breast many years ago.  Of note, her tumor is ER negative and HER-2 positive.  She got adjuvant chemotherapy with 6 cycles of carboplatin/Taxotere. She completed this in August.  She got concurrent Herceptin.  She now is on maintenance Herceptin.  Her last MUGA scan was done on November 1st.  This showed a good ejection fraction.  We do not need to do another one until February.  We will go ahead and plan to get her back to see [place] in 3 weeks.  She has really done well this year.  I will pray that next year will be a quiet year for her.    ______________________________ Josph Macho, M.D. PRE/MEDQ  D:  10/22/2012  T:  10/23/2012  Job:  [id number]

## 2012-11-05 ENCOUNTER — Other Ambulatory Visit: Payer: Federal, State, Local not specified - PPO | Admitting: Lab

## 2012-11-05 ENCOUNTER — Ambulatory Visit: Payer: Federal, State, Local not specified - PPO

## 2012-11-05 ENCOUNTER — Ambulatory Visit: Payer: Federal, State, Local not specified - PPO | Admitting: Medical

## 2012-11-12 ENCOUNTER — Ambulatory Visit (HOSPITAL_BASED_OUTPATIENT_CLINIC_OR_DEPARTMENT_OTHER): Payer: Federal, State, Local not specified - PPO | Admitting: Hematology & Oncology

## 2012-11-12 ENCOUNTER — Ambulatory Visit (HOSPITAL_BASED_OUTPATIENT_CLINIC_OR_DEPARTMENT_OTHER): Payer: Federal, State, Local not specified - PPO | Admitting: Lab

## 2012-11-12 ENCOUNTER — Ambulatory Visit (HOSPITAL_BASED_OUTPATIENT_CLINIC_OR_DEPARTMENT_OTHER): Payer: Federal, State, Local not specified - PPO

## 2012-11-12 DIAGNOSIS — C50919 Malignant neoplasm of unspecified site of unspecified female breast: Secondary | ICD-10-CM

## 2012-11-12 DIAGNOSIS — Z171 Estrogen receptor negative status [ER-]: Secondary | ICD-10-CM

## 2012-11-12 DIAGNOSIS — Z5112 Encounter for antineoplastic immunotherapy: Secondary | ICD-10-CM

## 2012-11-12 DIAGNOSIS — C50519 Malignant neoplasm of lower-outer quadrant of unspecified female breast: Secondary | ICD-10-CM

## 2012-11-12 DIAGNOSIS — C773 Secondary and unspecified malignant neoplasm of axilla and upper limb lymph nodes: Secondary | ICD-10-CM

## 2012-11-12 DIAGNOSIS — Z86718 Personal history of other venous thrombosis and embolism: Secondary | ICD-10-CM

## 2012-11-12 LAB — COMPREHENSIVE METABOLIC PANEL
Alkaline Phosphatase: 45 U/L (ref 39–117)
BUN: 16 mg/dL (ref 6–23)
Glucose, Bld: 88 mg/dL (ref 70–99)
Sodium: 139 mEq/L (ref 135–145)
Total Bilirubin: 0.4 mg/dL (ref 0.3–1.2)

## 2012-11-12 LAB — CBC WITH DIFFERENTIAL (CANCER CENTER ONLY)
BASO#: 0 10*3/uL (ref 0.0–0.2)
Eosinophils Absolute: 0.1 10*3/uL (ref 0.0–0.5)
LYMPH%: 35.6 % (ref 14.0–48.0)
MCH: 32 pg (ref 26.0–34.0)
MCHC: 33.3 g/dL (ref 32.0–36.0)
MCV: 96 fL (ref 81–101)
MONO%: 9.9 % (ref 0.0–13.0)
Platelets: 206 10*3/uL (ref 145–400)
RBC: 3.75 10*6/uL (ref 3.70–5.32)

## 2012-11-12 MED ORDER — ACETAMINOPHEN 325 MG PO TABS
650.0000 mg | ORAL_TABLET | Freq: Once | ORAL | Status: AC
  Administered 2012-11-12: 650 mg via ORAL

## 2012-11-12 MED ORDER — SODIUM CHLORIDE 0.9 % IV SOLN
Freq: Once | INTRAVENOUS | Status: AC
  Administered 2012-11-12: 10:00:00 via INTRAVENOUS

## 2012-11-12 MED ORDER — TRASTUZUMAB CHEMO INJECTION 440 MG
6.0000 mg/kg | Freq: Once | INTRAVENOUS | Status: AC
  Administered 2012-11-12: 420 mg via INTRAVENOUS
  Filled 2012-11-12: qty 20

## 2012-11-12 MED ORDER — DIPHENHYDRAMINE HCL 25 MG PO CAPS
50.0000 mg | ORAL_CAPSULE | Freq: Once | ORAL | Status: AC
  Administered 2012-11-12: 50 mg via ORAL

## 2012-11-12 NOTE — Progress Notes (Signed)
This office note has been dictated.

## 2012-11-12 NOTE — Patient Instructions (Addendum)

## 2012-11-13 NOTE — Progress Notes (Signed)
CC:   Duke Salvia. Marcelle Overlie, M.D. Sandria Bales. Ezzard Standing, M.D.  DIAGNOSES: 1. Stage IIB (T2 N1 M0) ductal carcinoma of the right breast. 2. Deep venous thrombosis of the left subclavian vein.  CURRENT THERAPY: 1. Maintenance Herceptin q.3 week dosing. 2. Xarelto 20 mg p.o. daily.  INTERIM HISTORY:  Ms. Ketchem comes in for a followup.  She is looking good.  Her hair is coming back.  She enjoyed New Year's.  She is looking forward to a quiet year this year.  She has had no problems with the Herceptin.  There has been no cough or shortness of breath.  She has had no nausea or vomiting.  There has been no leg swelling.  In fact, she says that some of the sensation is coming back on the right anterior chest wall where she had her mastectomy.  There has been no swelling in the left arm.  She is on Xarelto and this is helping her out quite a bit.  PHYSICAL EXAMINATION:  General:  This is a well-developed, well- nourished black female in no obvious distress.  Vital signs:  Show temperature of 97.6, pulse 84, respiratory rate 18, blood pressure 125/79.  Weight is 154.  Head and neck:  Shows a normocephalic, atraumatic skull.  There are no ocular or oral lesions.  There are no palpable cervical or supraclavicular lymph nodes.  Lungs:  Clear bilaterally.  Cardiac:  Regular rate and rhythm with a normal S1 and S2. There are no murmurs, rubs or bruits.  Abdomen:  Soft with good bowel sounds.  There is no palpable abdominal mass.  There is no palpable hepatosplenomegaly.  Breasts:  Left breast with no masses, edema or erythema.  There is no left axillary adenopathy.  Right chest wall shows a well-healed mastectomy.  There is some hypopigmentation from past radiation.  She has no nodularity under the right anterior chest wall. There is no right axillary adenopathy.  Extremities:  Show no clubbing, cyanosis or edema.  No lymphedema is noted in the left arm. Neurological:  Shows no focal neurological  deficits.  LABORATORY STUDIES:  White cell count is 4.2, hemoglobin 12, hematocrit 36, platelet count is 206.  IMPRESSION:  Ms. Diles is a 62 year old African American female with a 2nd primary breast cancer.  She has stage IIB infiltrating ductal carcinoma of the right breast.  Her tumor is ER negative but HER2 positive.  Again, she is on maintenance of Herceptin.  She had 6 cycles of Taxotere/carboplatin.  This was completed back in August of 2013.  We will go ahead with her Herceptin.  We will go ahead with another MUGA scan.  I think that we need to do these every 3 months for maintenance of cardiac function.  She was diagnosed with her deep venous thrombosis I think back in August.  I would like for her to be on 1 year of anticoagulation.  We will plan to get her back in 3 weeks for her next followup.    ______________________________ Josph Macho, M.D. PRE/MEDQ  D:  11/12/2012  T:  11/13/2012  Job:  [id number]

## 2012-11-25 NOTE — Telephone Encounter (Signed)
Opened in error

## 2012-12-03 ENCOUNTER — Other Ambulatory Visit: Payer: Federal, State, Local not specified - PPO | Admitting: Lab

## 2012-12-03 ENCOUNTER — Ambulatory Visit: Payer: Federal, State, Local not specified - PPO | Admitting: Hematology & Oncology

## 2012-12-03 ENCOUNTER — Ambulatory Visit: Payer: Federal, State, Local not specified - PPO

## 2012-12-11 ENCOUNTER — Ambulatory Visit: Payer: Federal, State, Local not specified - PPO | Admitting: Hematology & Oncology

## 2012-12-11 ENCOUNTER — Other Ambulatory Visit: Payer: Federal, State, Local not specified - PPO | Admitting: Lab

## 2012-12-11 ENCOUNTER — Ambulatory Visit: Payer: Federal, State, Local not specified - PPO

## 2012-12-14 ENCOUNTER — Encounter (HOSPITAL_COMMUNITY): Payer: Federal, State, Local not specified - PPO

## 2012-12-14 ENCOUNTER — Encounter (HOSPITAL_COMMUNITY)
Admission: RE | Admit: 2012-12-14 | Discharge: 2012-12-14 | Disposition: A | Discharge status: Home / Self Care | Payer: Federal, State, Local not specified - PPO | Source: Ambulatory Visit | Attending: Hematology & Oncology | Admitting: Hematology & Oncology

## 2012-12-14 DIAGNOSIS — C50919 Malignant neoplasm of unspecified site of unspecified female breast: Secondary | ICD-10-CM

## 2012-12-14 MED ORDER — TECHNETIUM TC 99M-LABELED RED BLOOD CELLS IV KIT: 23.4000 | PACK | Freq: Once | INTRAVENOUS | Status: AC | PRN

## 2012-12-15 ENCOUNTER — Telehealth: Payer: Self-pay | Admitting: Hematology & Oncology

## 2012-12-15 ENCOUNTER — Telehealth: Payer: Self-pay | Admitting: *Deleted

## 2012-12-15 NOTE — Telephone Encounter (Signed)
Called patient to let her know that her MUGA scan was good and heart still pumping like a champ per dr. Myna Hidalgo

## 2012-12-15 NOTE — Telephone Encounter (Signed)
Message copied by Anselm Jungling on Tue Dec 15, 2012 11:22 AM ------      Message from: Arlan Organ R      Created: Mon Dec 14, 2012  6:21 PM       Call - heart still pumping like a champ!!!  Pete ------

## 2012-12-15 NOTE — Telephone Encounter (Signed)
Pt aware of 2-27 appointment. MD stated that is fine.

## 2012-12-17 ENCOUNTER — Ambulatory Visit: Payer: Federal, State, Local not specified - PPO

## 2012-12-17 ENCOUNTER — Other Ambulatory Visit: Payer: Federal, State, Local not specified - PPO | Admitting: Lab

## 2012-12-17 ENCOUNTER — Ambulatory Visit: Payer: Federal, State, Local not specified - PPO | Admitting: Medical

## 2012-12-24 ENCOUNTER — Ambulatory Visit (HOSPITAL_BASED_OUTPATIENT_CLINIC_OR_DEPARTMENT_OTHER): Payer: Federal, State, Local not specified - PPO

## 2012-12-24 ENCOUNTER — Other Ambulatory Visit (HOSPITAL_BASED_OUTPATIENT_CLINIC_OR_DEPARTMENT_OTHER): Payer: Federal, State, Local not specified - PPO | Admitting: Lab

## 2012-12-24 ENCOUNTER — Ambulatory Visit (HOSPITAL_BASED_OUTPATIENT_CLINIC_OR_DEPARTMENT_OTHER): Payer: Federal, State, Local not specified - PPO | Admitting: Hematology & Oncology

## 2012-12-24 VITALS — BP 135/75 | HR 75 | Temp 98.0°F | Resp 16 | Ht 65.0 in | Wt 156.0 lb

## 2012-12-24 DIAGNOSIS — C50911 Malignant neoplasm of unspecified site of right female breast: Secondary | ICD-10-CM

## 2012-12-24 DIAGNOSIS — C773 Secondary and unspecified malignant neoplasm of axilla and upper limb lymph nodes: Secondary | ICD-10-CM

## 2012-12-24 DIAGNOSIS — I82402 Acute embolism and thrombosis of unspecified deep veins of left lower extremity: Secondary | ICD-10-CM

## 2012-12-24 DIAGNOSIS — C50519 Malignant neoplasm of lower-outer quadrant of unspecified female breast: Secondary | ICD-10-CM

## 2012-12-24 DIAGNOSIS — Z5112 Encounter for antineoplastic immunotherapy: Secondary | ICD-10-CM

## 2012-12-24 DIAGNOSIS — C50919 Malignant neoplasm of unspecified site of unspecified female breast: Secondary | ICD-10-CM

## 2012-12-24 DIAGNOSIS — I82B19 Acute embolism and thrombosis of unspecified subclavian vein: Secondary | ICD-10-CM

## 2012-12-24 LAB — COMPREHENSIVE METABOLIC PANEL
Albumin: 3.7 g/dL (ref 3.5–5.2)
Alkaline Phosphatase: 42 U/L (ref 39–117)
BUN: 15 mg/dL (ref 6–23)
CO2: 21 mEq/L (ref 19–32)
Glucose, Bld: 96 mg/dL (ref 70–99)
Potassium: 3.8 mEq/L (ref 3.5–5.3)
Total Bilirubin: 0.4 mg/dL (ref 0.3–1.2)

## 2012-12-24 LAB — CBC WITH DIFFERENTIAL (CANCER CENTER ONLY)
BASO#: 0 10*3/uL (ref 0.0–0.2)
Eosinophils Absolute: 0 10*3/uL (ref 0.0–0.5)
HGB: 12.2 g/dL (ref 11.6–15.9)
LYMPH%: 31.4 % (ref 14.0–48.0)
MCH: 32 pg (ref 26.0–34.0)
MCV: 97 fL (ref 81–101)
MONO#: 0.4 10*3/uL (ref 0.1–0.9)
MONO%: 7.2 % (ref 0.0–13.0)
RBC: 3.81 10*6/uL (ref 3.70–5.32)
WBC: 5.4 10*3/uL (ref 3.9–10.0)

## 2012-12-24 MED ORDER — ACETAMINOPHEN 325 MG PO TABS
650.0000 mg | ORAL_TABLET | Freq: Once | ORAL | Status: AC
  Administered 2012-12-24: 650 mg via ORAL

## 2012-12-24 MED ORDER — SODIUM CHLORIDE 0.9 % IV SOLN
Freq: Once | INTRAVENOUS | Status: AC
  Administered 2012-12-24: 11:00:00 via INTRAVENOUS

## 2012-12-24 MED ORDER — DIPHENHYDRAMINE HCL 25 MG PO CAPS
50.0000 mg | ORAL_CAPSULE | Freq: Once | ORAL | Status: AC
  Administered 2012-12-24: 50 mg via ORAL

## 2012-12-24 MED ORDER — TRASTUZUMAB CHEMO INJECTION 440 MG
6.0000 mg/kg | Freq: Once | INTRAVENOUS | Status: AC
  Administered 2012-12-24: 420 mg via INTRAVENOUS
  Filled 2012-12-24: qty 20

## 2012-12-24 NOTE — Progress Notes (Signed)
Unable to draw blood from IV, venipuncture x 2 by [facility] to collect blood. Teola Bradley, Myia Bergh [employer]

## 2012-12-24 NOTE — Patient Instructions (Signed)

## 2012-12-24 NOTE — Progress Notes (Signed)
This office note has been dictated.

## 2012-12-25 NOTE — Progress Notes (Signed)
CC:   Maureen Duncan. Maureen Duncan, M.D. Richard M. Marcelle Overlie, M.D.  DIAGNOSES: 1. Stage IIB (T2 N1 M0) ductal carcinoma of the right breast. 2. Left subclavian vein deep vein thrombosis.  CURRENT THERAPY: 1. Maintenance Herceptin every 3 weeks. 2. Xarelto 20 mg p.o. daily.  INTERIM HISTORY:  Maureen Duncan comes in for her followup.  She is doing quite well.  She has had no specific complaints.  She has had no fevers, sweats, or chills.  There has been no chest wall pain.  She has had no cough or shortness of breath.  She has had no change in bowel or bladder habits.  There has been no leg swelling.  The DVT in the left subclavian vein has responded quite nicely.  There is no swelling in the left arm.  PHYSICAL EXAMINATION:  General:  This is a well-developed, well- nourished black female in no obvious distress.  Vital signs: Temperature of 98, pulse 75, respiratory rate 16, blood pressure 135/76. Weight is 156.  Head and neck:  Normocephalic, atraumatic skull.  There are no ocular or oral lesions.  There are no palpable cervical or supraclavicular lymph nodes.  Lungs:  Clear bilaterally.  Cardiac: Regular rate and rhythm with a normal S1 and S2.  There are no murmurs, rubs, or bruits.  Chest:  Left breast with no masses, edema, or erythema.  There is no left axillary adenopathy.  Right chest wall shows a well-healed mastectomy.  She has some hyperpigmentation on the right anterior chest wall.  There is no erythema or nodularity of the right anterior chest wall.  There is no right axillary adenopathy.  Abdomen: Soft abdomen with good bowel sounds.  There is no fluid wave.  There is no palpable abdominal mass.  There is no palpable hepatosplenomegaly. Extremities:  No clubbing, cyanosis, or edema.  Skin:  No rashes, ecchymosis, or petechia.  LABORATORY STUDIES:  White cell count is 5.4, hemoglobin 12.2, hematocrit 36.9, platelet count 213.  IMPRESSION:  Maureen Duncan is a 62 year old African American  female with stage IIB infiltrating ductal carcinoma of the right breast.  She underwent mastectomy.  She is ER negative and HER2 positive.  She had 3 positive lymph nodes.  Again, her tumor is ER negative and HER2 positive.  She is on Herceptin right now.  She will complete 1 year of Herceptin in, I think, August of this year.  She also will finish up her anticoagulation in, I think, August for the DVT of the left subclavian vein.  We will have Maureen Duncan come back in 3 weeks for another dose of Herceptin.  We will see her back ourselves in 6 weeks.    ______________________________ Josph Macho, M.D. PRE/MEDQ  D:  12/24/2012  T:  12/25/2012  Job:  [id number]

## 2013-01-04 ENCOUNTER — Ambulatory Visit: Payer: Federal, State, Local not specified - PPO | Admitting: Medical

## 2013-01-04 ENCOUNTER — Other Ambulatory Visit: Payer: Federal, State, Local not specified - PPO | Admitting: Lab

## 2013-01-04 ENCOUNTER — Ambulatory Visit: Payer: Federal, State, Local not specified - PPO

## 2013-01-08 ENCOUNTER — Other Ambulatory Visit: Payer: Self-pay | Admitting: *Deleted

## 2013-01-08 DIAGNOSIS — R059 Cough, unspecified: Secondary | ICD-10-CM

## 2013-01-08 DIAGNOSIS — R05 Cough: Secondary | ICD-10-CM

## 2013-01-08 MED ORDER — BENZONATATE 100 MG PO CAPS: 100.0000 mg | ORAL_CAPSULE | Freq: Two times a day (BID) | ORAL | Status: DC | PRN

## 2013-01-11 ENCOUNTER — Telehealth: Payer: Self-pay | Admitting: Medical Oncology

## 2013-01-11 NOTE — Telephone Encounter (Signed)
Reports UTI symptoms of dysuria and frequency x 24 hours She has been drinking cranberry juice and increasing water which helped a little but symptoms ongoing.Per Dr Arbutus Ped, I instructed pt to go to [facility] . SHe voices understanding.

## 2013-01-15 ENCOUNTER — Other Ambulatory Visit: Payer: Self-pay | Admitting: *Deleted

## 2013-01-15 ENCOUNTER — Other Ambulatory Visit (HOSPITAL_BASED_OUTPATIENT_CLINIC_OR_DEPARTMENT_OTHER): Payer: Federal, State, Local not specified - PPO | Admitting: Lab

## 2013-01-15 ENCOUNTER — Ambulatory Visit (HOSPITAL_BASED_OUTPATIENT_CLINIC_OR_DEPARTMENT_OTHER): Payer: Federal, State, Local not specified - PPO

## 2013-01-15 VITALS — BP 126/75 | HR 75 | Temp 97.0°F

## 2013-01-15 DIAGNOSIS — Z5112 Encounter for antineoplastic immunotherapy: Secondary | ICD-10-CM

## 2013-01-15 DIAGNOSIS — C773 Secondary and unspecified malignant neoplasm of axilla and upper limb lymph nodes: Secondary | ICD-10-CM

## 2013-01-15 DIAGNOSIS — C50911 Malignant neoplasm of unspecified site of right female breast: Secondary | ICD-10-CM

## 2013-01-15 DIAGNOSIS — C50519 Malignant neoplasm of lower-outer quadrant of unspecified female breast: Secondary | ICD-10-CM

## 2013-01-15 DIAGNOSIS — I82402 Acute embolism and thrombosis of unspecified deep veins of left lower extremity: Secondary | ICD-10-CM

## 2013-01-15 LAB — CBC WITH DIFFERENTIAL (CANCER CENTER ONLY)
BASO#: 0 10*3/uL (ref 0.0–0.2)
EOS%: 0.9 % (ref 0.0–7.0)
LYMPH%: 33.4 % (ref 14.0–48.0)
MCH: 31.9 pg (ref 26.0–34.0)
MCHC: 32.5 g/dL (ref 32.0–36.0)
MCV: 98 fL (ref 81–101)
MONO%: 8.2 % (ref 0.0–13.0)
NEUT#: 3.4 10*3/uL (ref 1.5–6.5)
Platelets: 258 10*3/uL (ref 145–400)

## 2013-01-15 MED ORDER — RIVAROXABAN 20 MG PO TABS: 20.0000 mg | ORAL_TABLET | Freq: Every day | ORAL | Status: DC

## 2013-01-15 MED ORDER — DIPHENHYDRAMINE HCL 25 MG PO CAPS
50.0000 mg | ORAL_CAPSULE | Freq: Once | ORAL | Status: AC
  Administered 2013-01-15: 50 mg via ORAL

## 2013-01-15 MED ORDER — ACETAMINOPHEN 325 MG PO TABS
650.0000 mg | ORAL_TABLET | Freq: Once | ORAL | Status: AC
  Administered 2013-01-15: 650 mg via ORAL

## 2013-01-15 MED ORDER — SODIUM CHLORIDE 0.9 % IV SOLN
Freq: Once | INTRAVENOUS | Status: AC
  Administered 2013-01-15: 10:00:00 via INTRAVENOUS

## 2013-01-15 MED ORDER — TRASTUZUMAB CHEMO INJECTION 440 MG
6.0000 mg/kg | Freq: Once | INTRAVENOUS | Status: AC
  Administered 2013-01-15: 420 mg via INTRAVENOUS
  Filled 2013-01-15: qty 20

## 2013-01-15 NOTE — Patient Instructions (Addendum)

## 2013-02-04 ENCOUNTER — Ambulatory Visit (HOSPITAL_BASED_OUTPATIENT_CLINIC_OR_DEPARTMENT_OTHER): Payer: Federal, State, Local not specified - PPO | Admitting: Medical

## 2013-02-04 ENCOUNTER — Other Ambulatory Visit (HOSPITAL_BASED_OUTPATIENT_CLINIC_OR_DEPARTMENT_OTHER): Payer: Federal, State, Local not specified - PPO | Admitting: Lab

## 2013-02-04 ENCOUNTER — Other Ambulatory Visit: Payer: Self-pay | Admitting: *Deleted

## 2013-02-04 ENCOUNTER — Ambulatory Visit (HOSPITAL_BASED_OUTPATIENT_CLINIC_OR_DEPARTMENT_OTHER): Payer: Federal, State, Local not specified - PPO

## 2013-02-04 VITALS — BP 137/84 | HR 74 | Temp 97.8°F | Resp 16 | Ht 65.0 in | Wt 161.0 lb

## 2013-02-04 DIAGNOSIS — I82402 Acute embolism and thrombosis of unspecified deep veins of left lower extremity: Secondary | ICD-10-CM

## 2013-02-04 DIAGNOSIS — C773 Secondary and unspecified malignant neoplasm of axilla and upper limb lymph nodes: Secondary | ICD-10-CM

## 2013-02-04 DIAGNOSIS — C50519 Malignant neoplasm of lower-outer quadrant of unspecified female breast: Secondary | ICD-10-CM

## 2013-02-04 DIAGNOSIS — Z5112 Encounter for antineoplastic immunotherapy: Secondary | ICD-10-CM

## 2013-02-04 DIAGNOSIS — I82B19 Acute embolism and thrombosis of unspecified subclavian vein: Secondary | ICD-10-CM

## 2013-02-04 DIAGNOSIS — C50911 Malignant neoplasm of unspecified site of right female breast: Secondary | ICD-10-CM

## 2013-02-04 DIAGNOSIS — C50919 Malignant neoplasm of unspecified site of unspecified female breast: Secondary | ICD-10-CM

## 2013-02-04 DIAGNOSIS — I890 Lymphedema, not elsewhere classified: Secondary | ICD-10-CM

## 2013-02-04 LAB — CBC WITH DIFFERENTIAL (CANCER CENTER ONLY)
BASO#: 0 10*3/uL (ref 0.0–0.2)
BASO%: 0.2 % (ref 0.0–2.0)
EOS%: 1 % (ref 0.0–7.0)
HGB: 11.5 g/dL — ABNORMAL LOW (ref 11.6–15.9)
LYMPH#: 1.7 10*3/uL (ref 0.9–3.3)
MCH: 32.1 pg (ref 26.0–34.0)
MCHC: 33 g/dL (ref 32.0–36.0)
MONO%: 9.1 % (ref 0.0–13.0)
NEUT#: 2.6 10*3/uL (ref 1.5–6.5)
NEUT%: 53.7 % (ref 39.6–80.0)
RDW: 12.7 % (ref 11.1–15.7)

## 2013-02-04 LAB — COMPREHENSIVE METABOLIC PANEL
ALT: 12 U/L (ref 0–35)
AST: 19 U/L (ref 0–37)
Alkaline Phosphatase: 45 U/L (ref 39–117)
BUN: 16 mg/dL (ref 6–23)
Calcium: 9.8 mg/dL (ref 8.4–10.5)
Chloride: 104 mEq/L (ref 96–112)
Creatinine, Ser: 0.83 mg/dL (ref 0.50–1.10)
Potassium: 3.7 mEq/L (ref 3.5–5.3)

## 2013-02-04 MED ORDER — SODIUM CHLORIDE 0.9 % IV SOLN
Freq: Once | INTRAVENOUS | Status: AC
  Administered 2013-02-04: 11:00:00 via INTRAVENOUS

## 2013-02-04 MED ORDER — ACETAMINOPHEN 325 MG PO TABS
650.0000 mg | ORAL_TABLET | Freq: Once | ORAL | Status: AC
  Administered 2013-02-04: 650 mg via ORAL

## 2013-02-04 MED ORDER — TRASTUZUMAB CHEMO INJECTION 440 MG
6.0000 mg/kg | Freq: Once | INTRAVENOUS | Status: AC
  Administered 2013-02-04: 420 mg via INTRAVENOUS
  Filled 2013-02-04: qty 20

## 2013-02-04 MED ORDER — DIPHENHYDRAMINE HCL 25 MG PO CAPS
50.0000 mg | ORAL_CAPSULE | Freq: Once | ORAL | Status: AC
  Administered 2013-02-04: 50 mg via ORAL

## 2013-02-04 MED ORDER — RIVAROXABAN 20 MG PO TABS: 20.0000 mg | ORAL_TABLET | Freq: Every day | ORAL | Status: DC

## 2013-02-04 NOTE — Progress Notes (Signed)
DIAGNOSES: 1. Stage IIB (T2 N1 M0) ductal carcinoma of the right breast. 2. Left subclavian vein deep vein thrombosis. 3. Lymphedema involving the right hand  CURRENT THERAPY: 1. Maintenance Herceptin every 3 weeks. 2. Xarelto 20 mg p.o. daily.  INTERIM HISTORY: Maureen Duncan presents today for an office followup visit.  Overall, she's doing quite well.  She's not has no specific complaints.  She remains on Herceptin every 3 weeks.  She's not having any problems with Herceptin.  She's also on Xarelto without any problems.  She does not report any issues with bleeding.  She has some mild lymphedema of the right arm.  She does have a lymphedema sleeve.  She has no swelling of the left arm from the subclavian DVT.  She has a good appetite.  She denies any nausea, vomiting, diarrhea, constipation, chest pain, shortness of breath, cough, fevers, chills, or night sweats.  She denies any abdominal pain, any lower leg swelling.  She denies any obvious, or abnormal bleeding.  She denies any headaches, visual changes, or rashes.  Review of Systems: Constitutional:Negative for malaise/fatigue, fever, chills, weight loss, diaphoresis, activity change, appetite change, and unexpected weight change.  HEENT: Negative for double vision, blurred vision, visual loss, ear pain, tinnitus, congestion, rhinorrhea, epistaxis sore throat or sinus disease, oral pain/lesion, tongue soreness Respiratory: Negative for cough, chest tightness, shortness of breath, wheezing and stridor.  Cardiovascular: Negative for chest pain, palpitations, leg swelling, orthopnea, PND, DOE or claudication Gastrointestinal: Negative for nausea, vomiting, abdominal pain, diarrhea, constipation, blood in stool, melena, hematochezia, abdominal distention, anal bleeding, rectal pain, anorexia and hematemesis.  Genitourinary: Negative for dysuria, frequency, hematuria,  Musculoskeletal: Negative for myalgias, back pain, joint swelling, arthralgias and  gait problem.  Skin: Negative for rash, color change, pallor and wound.  Neurological:. Negative for dizziness/light-headedness, tremors, seizures, syncope, facial asymmetry, speech difficulty, weakness, numbness, headaches and paresthesias.  Hematological: Negative for adenopathy. Does not bruise/bleed easily.  Psychiatric/Behavioral:  Negative for depression, no loss of interest in normal activity or change in sleep pattern.   Physical Exam: This is a pleasant, 62 year old, well-developed, well-nourished Afro-American female, in no obvious distress Vitals: Temperature 97.8 degrees, pulse 74, respirations 16, blood pressure 137/84.  Weight 161 pounds HEENT reveals a normocephalic, atraumatic skull, no scleral icterus, no oral lesions  Neck is supple without any cervical or supraclavicular adenopathy.  Lungs are clear to auscultation bilaterally. There are no wheezes, rales or rhonci Cardiac is regular rate and rhythm with a normal S1 and S2. There are no murmurs, rubs, or bruits.  Abdomen is soft with good bowel sounds, there is no palpable mass. There is no palpable hepatosplenomegaly. There is no palpable fluid wave.  Musculoskeletal no tenderness of the spine, ribs, or hips.  Extremities there are no clubbing, cyanosis, or edema.  Skin no petechia, purpura or ecchymosis Neurologic is nonfocal.  Laboratory Data: White count 4.8, hemoglobin 1.5, hematocrit 34.9, platelets 226,000  Current Outpatient Prescriptions on File Prior to Visit  Medication Sig Dispense Refill  . Alum & Mag Hydroxide-Simeth (MAGIC MOUTHWASH W/LIDOCAINE) SOLN Take 5 mLs by mouth 4 (four) times daily as needed. Swish and Spit.  240 mL  1  . benzonatate (TESSALON) 100 MG capsule Take 1 capsule (100 mg total) by mouth 2 (two) times daily as needed. Cough  30 capsule  0  . diphenhydrAMINE (BENADRYL) 25 MG tablet Take 25 mg by mouth every 6 (six) hours as needed.      . loratadine (CLARITIN) 10 MG  tablet Take 1 tablet  (10 mg total) by mouth daily.  30 tablet  11  . LORazepam (ATIVAN) 0.5 MG tablet 0.5 mg as needed.       . Rivaroxaban (XARELTO) 20 MG TABS Take 1 tablet (20 mg total) by mouth daily.  30 tablet  3  . traMADol (ULTRAM) 50 MG tablet every 8 (eight) hours as needed.       . [DISCONTINUED] calcium-vitamin D (OSCAL) 250-125 MG-UNIT per tablet Take 1 tablet by mouth daily.          Assessment/Plan: This is a pleasant, 62 year old, African American, female, with the following issues:  #1.  Stage IIb infiltrating ductal carcinoma of the right breast.  She underwent a mastectomy.  She is ER negative and HER-2, positive.  She did have 3 positive lymph nodes.  She currently is on Herceptin.  She will complete one year Herceptin August of this year.  She receives Herceptin every 3 weeks.  #2.  Left subclavian vein, deep vein thrombosis.  She remains on Xarelto.  She will complete her anticoagulation August of this year.  #3.  Lymphedema involving the right hand.  She does have a lymphedema sleeve.  #4.  Followup.  We will follow back up with Ms. Braley in 6 weeks, but before then should there be questions or concerns.

## 2013-02-04 NOTE — Patient Instructions (Addendum)

## 2013-02-04 NOTE — Telephone Encounter (Signed)
While the pt was here for her tx she asked about her Xarelto rx. A refill was sent to the [facility] on 01/15/13 but the pt states she wasn't aware of it. Is requesting a 90 day supply be sent to her mail order pharmacy. A new rx was send to [facility] as requested.

## 2013-02-25 ENCOUNTER — Ambulatory Visit (HOSPITAL_BASED_OUTPATIENT_CLINIC_OR_DEPARTMENT_OTHER): Payer: Federal, State, Local not specified - PPO

## 2013-02-25 ENCOUNTER — Ambulatory Visit (HOSPITAL_BASED_OUTPATIENT_CLINIC_OR_DEPARTMENT_OTHER): Payer: Federal, State, Local not specified - PPO | Admitting: Lab

## 2013-02-25 VITALS — BP 130/82 | HR 88 | Temp 98.9°F | Resp 20

## 2013-02-25 DIAGNOSIS — C50519 Malignant neoplasm of lower-outer quadrant of unspecified female breast: Secondary | ICD-10-CM

## 2013-02-25 DIAGNOSIS — C773 Secondary and unspecified malignant neoplasm of axilla and upper limb lymph nodes: Secondary | ICD-10-CM

## 2013-02-25 DIAGNOSIS — Z5112 Encounter for antineoplastic immunotherapy: Secondary | ICD-10-CM

## 2013-02-25 DIAGNOSIS — C50911 Malignant neoplasm of unspecified site of right female breast: Secondary | ICD-10-CM

## 2013-02-25 DIAGNOSIS — C50919 Malignant neoplasm of unspecified site of unspecified female breast: Secondary | ICD-10-CM

## 2013-02-25 LAB — CBC WITH DIFFERENTIAL (CANCER CENTER ONLY)
BASO#: 0 10*3/uL (ref 0.0–0.2)
EOS%: 1.1 % (ref 0.0–7.0)
Eosinophils Absolute: 0.1 10*3/uL (ref 0.0–0.5)
HCT: 35.2 % (ref 34.8–46.6)
HGB: 11.5 g/dL — ABNORMAL LOW (ref 11.6–15.9)
LYMPH#: 1.9 10*3/uL (ref 0.9–3.3)
MCHC: 32.7 g/dL (ref 32.0–36.0)
MONO#: 0.7 10*3/uL (ref 0.1–0.9)
NEUT#: 4 10*3/uL (ref 1.5–6.5)
NEUT%: 60.8 % (ref 39.6–80.0)
RBC: 3.64 10*6/uL — ABNORMAL LOW (ref 3.70–5.32)

## 2013-02-25 MED ORDER — DIPHENHYDRAMINE HCL 25 MG PO CAPS
50.0000 mg | ORAL_CAPSULE | Freq: Once | ORAL | Status: AC
  Administered 2013-02-25: 50 mg via ORAL

## 2013-02-25 MED ORDER — SODIUM CHLORIDE 0.9 % IV SOLN
Freq: Once | INTRAVENOUS | Status: AC
  Administered 2013-02-25: 12:00:00 via INTRAVENOUS

## 2013-02-25 MED ORDER — ACETAMINOPHEN 325 MG PO TABS
650.0000 mg | ORAL_TABLET | Freq: Once | ORAL | Status: AC
  Administered 2013-02-25: 650 mg via ORAL

## 2013-02-25 MED ORDER — TRASTUZUMAB CHEMO INJECTION 440 MG
6.0000 mg/kg | Freq: Once | INTRAVENOUS | Status: AC
  Administered 2013-02-25: 420 mg via INTRAVENOUS
  Filled 2013-02-25: qty 20

## 2013-02-25 NOTE — Patient Instructions (Addendum)

## 2013-03-18 ENCOUNTER — Ambulatory Visit (HOSPITAL_BASED_OUTPATIENT_CLINIC_OR_DEPARTMENT_OTHER): Payer: Federal, State, Local not specified - PPO | Admitting: Hematology & Oncology

## 2013-03-18 ENCOUNTER — Other Ambulatory Visit (HOSPITAL_BASED_OUTPATIENT_CLINIC_OR_DEPARTMENT_OTHER): Payer: Federal, State, Local not specified - PPO | Admitting: Lab

## 2013-03-18 ENCOUNTER — Ambulatory Visit (HOSPITAL_BASED_OUTPATIENT_CLINIC_OR_DEPARTMENT_OTHER): Payer: Federal, State, Local not specified - PPO

## 2013-03-18 VITALS — BP 131/86 | HR 80 | Temp 98.0°F | Resp 16 | Ht 65.0 in | Wt 157.0 lb

## 2013-03-18 DIAGNOSIS — Z5112 Encounter for antineoplastic immunotherapy: Secondary | ICD-10-CM

## 2013-03-18 DIAGNOSIS — C50519 Malignant neoplasm of lower-outer quadrant of unspecified female breast: Secondary | ICD-10-CM

## 2013-03-18 DIAGNOSIS — C50911 Malignant neoplasm of unspecified site of right female breast: Secondary | ICD-10-CM

## 2013-03-18 DIAGNOSIS — C773 Secondary and unspecified malignant neoplasm of axilla and upper limb lymph nodes: Secondary | ICD-10-CM

## 2013-03-18 DIAGNOSIS — J302 Other seasonal allergic rhinitis: Secondary | ICD-10-CM

## 2013-03-18 DIAGNOSIS — N959 Unspecified menopausal and perimenopausal disorder: Secondary | ICD-10-CM

## 2013-03-18 DIAGNOSIS — Z923 Personal history of irradiation: Secondary | ICD-10-CM

## 2013-03-18 DIAGNOSIS — I82402 Acute embolism and thrombosis of unspecified deep veins of left lower extremity: Secondary | ICD-10-CM

## 2013-03-18 DIAGNOSIS — J3489 Other specified disorders of nose and nasal sinuses: Secondary | ICD-10-CM

## 2013-03-18 LAB — CBC WITH DIFFERENTIAL (CANCER CENTER ONLY)
BASO#: 0 10e3/uL (ref 0.0–0.2)
BASO%: 0.3 % (ref 0.0–2.0)
EOS%: 1.3 % (ref 0.0–7.0)
Eosinophils Absolute: 0.1 10e3/uL (ref 0.0–0.5)
HCT: 36.9 % (ref 34.8–46.6)
HGB: 12.2 g/dL (ref 11.6–15.9)
LYMPH#: 1.6 10e3/uL (ref 0.9–3.3)
LYMPH%: 23.4 % (ref 14.0–48.0)
MCH: 31.9 pg (ref 26.0–34.0)
MCHC: 33.1 g/dL (ref 32.0–36.0)
MCV: 97 fL (ref 81–101)
MONO#: 0.6 10e3/uL (ref 0.1–0.9)
MONO%: 8.1 % (ref 0.0–13.0)
NEUT#: 4.5 10e3/uL (ref 1.5–6.5)
NEUT%: 66.9 % (ref 39.6–80.0)
Platelets: 229 10e3/uL (ref 145–400)
RBC: 3.82 10e6/uL (ref 3.70–5.32)
RDW: 12.8 % (ref 11.1–15.7)
WBC: 6.8 10e3/uL (ref 3.9–10.0)

## 2013-03-18 LAB — COMPREHENSIVE METABOLIC PANEL WITH GFR
ALT: 12 U/L (ref 0–35)
AST: 18 U/L (ref 0–37)
Albumin: 4.1 g/dL (ref 3.5–5.2)
Alkaline Phosphatase: 45 U/L (ref 39–117)
BUN: 11 mg/dL (ref 6–23)
CO2: 27 meq/L (ref 19–32)
Calcium: 9.6 mg/dL (ref 8.4–10.5)
Chloride: 105 meq/L (ref 96–112)
Creatinine, Ser: 0.81 mg/dL (ref 0.50–1.10)
Glucose, Bld: 84 mg/dL (ref 70–99)
Potassium: 3.8 meq/L (ref 3.5–5.3)
Sodium: 139 meq/L (ref 135–145)
Total Bilirubin: 0.4 mg/dL (ref 0.3–1.2)
Total Protein: 6.7 g/dL (ref 6.0–8.3)

## 2013-03-18 MED ORDER — ZOLEDRONIC ACID 4 MG/100ML IV SOLN
4.0000 mg | Freq: Once | INTRAVENOUS | Status: AC
  Administered 2013-03-18: 4 mg via INTRAVENOUS
  Filled 2013-03-18: qty 100

## 2013-03-18 MED ORDER — FLUTICASONE PROPIONATE 50 MCG/ACT NA SUSP: 2.0000 | Freq: Every day | NASAL | Status: DC

## 2013-03-18 MED ORDER — SODIUM CHLORIDE 0.9 % IV SOLN
Freq: Once | INTRAVENOUS | Status: AC
  Administered 2013-03-18: 10:00:00 via INTRAVENOUS

## 2013-03-18 MED ORDER — TRASTUZUMAB CHEMO INJECTION 440 MG
6.0000 mg/kg | Freq: Once | INTRAVENOUS | Status: AC
  Administered 2013-03-18: 420 mg via INTRAVENOUS
  Filled 2013-03-18: qty 20

## 2013-03-18 MED ORDER — ACETAMINOPHEN 325 MG PO TABS
650.0000 mg | ORAL_TABLET | Freq: Once | ORAL | Status: AC
  Administered 2013-03-18: 650 mg via ORAL

## 2013-03-18 MED ORDER — DIPHENHYDRAMINE HCL 25 MG PO CAPS
50.0000 mg | ORAL_CAPSULE | Freq: Once | ORAL | Status: AC
  Administered 2013-03-18: 50 mg via ORAL

## 2013-03-18 NOTE — Addendum Note (Signed)
Addended by: Arlan Organ R on: 03/18/2013 11:47 AM   Modules accepted: Orders

## 2013-03-18 NOTE — Patient Instructions (Addendum)
Trastuzumab injection for infusion What is this medicine? TRASTUZUMAB (tras TOO zoo mab) is a monoclonal antibody. It targets a protein called HER2. This protein is found in some stomach and breast cancers. This medicine can stop cancer cell growth. This medicine may be used with other cancer treatments. This medicine may be used for other purposes; ask your health care provider or pharmacist if you have questions. What should I tell my health care provider before I take this medicine? They need to know if you have any of these conditions: -heart disease -heart failure -infection (especially a virus infection such as chickenpox, cold sores, or herpes) -lung or breathing disease, like asthma -recent or ongoing radiation therapy -an unusual or allergic reaction to trastuzumab, benzyl alcohol, or other medications, foods, dyes, or preservatives -pregnant or trying to get pregnant -breast-feeding How should I use this medicine? This drug is given as an infusion into a vein. It is administered in a hospital or clinic by a specially trained health care professional. Talk to your pediatrician regarding the use of this medicine in children. This medicine is not approved for use in children. Overdosage: If you think you have taken too much of this medicine contact a poison control center or emergency room at once. NOTE: This medicine is only for you. Do not share this medicine with others. What if I miss a dose? It is important not to miss a dose. Call your doctor or health care professional if you are unable to keep an appointment. What may interact with this medicine? -cyclophosphamide -doxorubicin -warfarin This list may not describe all possible interactions. Give your health care provider a list of all the medicines, herbs, non-prescription drugs, or dietary supplements you use. Also tell them if you smoke, drink alcohol, or use illegal drugs. Some items may interact with your medicine. What  should I watch for while using this medicine? Visit your doctor for checks on your progress. Report any side effects. Continue your course of treatment even though you feel ill unless your doctor tells you to stop. Call your doctor or health care professional for advice if you get a fever, chills or sore throat, or other symptoms of a cold or flu. Do not treat yourself. Try to avoid being around people who are sick. You may experience fever, chills and shaking during your first infusion. These effects are usually mild and can be treated with other medicines. Report any side effects during the infusion to your health care professional. Fever and chills usually do not happen with later infusions. What side effects may I notice from receiving this medicine? Side effects that you should report to your doctor or other health care professional as soon as possible: -breathing difficulties -chest pain or palpitations -cough -dizziness or fainting -fever or chills, sore throat -skin rash, itching or hives -swelling of the legs or ankles -unusually weak or tired Side effects that usually do not require medical attention (report to your doctor or other health care professional if they continue or are bothersome): -loss of appetite -headache -muscle aches -nausea This list may not describe all possible side effects. Call your doctor for medical advice about side effects. You may report side effects to FDA at 1-800-FDA-1088. Where should I keep my medicine? This drug is given in a hospital or clinic and will not be stored at home. NOTE: This sheet is a summary. It may not cover all possible information. If you have questions about this medicine, talk to your doctor, pharmacist,   or health care provider.  2012, [employer] Standard. (08/18/2009 1:43:15 PM)Zoledronic Acid injection (Hypercalcemia, Oncology) What is this medicine? ZOLEDRONIC ACID (ZOE le dron ik AS id) lowers the amount of calcium loss  from bone. It is used to treat too much calcium in your blood from cancer. It is also used to prevent complications of cancer that has spread to the bone. This medicine may be used for other purposes; ask your health care provider or pharmacist if you have questions. What should I tell my health care provider before I take this medicine? They need to know if you have any of these conditions: -aspirin-sensitive asthma -dental disease -kidney disease -an unusual or allergic reaction to zoledronic acid, other medicines, foods, dyes, or preservatives -pregnant or trying to get pregnant -breast-feeding How should I use this medicine? This medicine is for infusion into a vein. It is given by a health care professional in a hospital or clinic setting. Talk to your pediatrician regarding the use of this medicine in children. Special care may be needed. Overdosage: If you think you have taken too much of this medicine contact a poison control center or emergency room at once. NOTE: This medicine is only for you. Do not share this medicine with others. What if I miss a dose? It is important not to miss your dose. Call your doctor or health care professional if you are unable to keep an appointment. What may interact with this medicine? -certain antibiotics given by injection -NSAIDs, medicines for pain and inflammation, like ibuprofen or naproxen -some diuretics like bumetanide, furosemide -teriparatide -thalidomide This list may not describe all possible interactions. Give your health care provider a list of all the medicines, herbs, non-prescription drugs, or dietary supplements you use. Also tell them if you smoke, drink alcohol, or use illegal drugs. Some items may interact with your medicine. What should I watch for while using this medicine? Visit your doctor or health care professional for regular checkups. It may be some time before you see the benefit from this medicine. Do not stop taking  your medicine unless your doctor tells you to. Your doctor may order blood tests or other tests to see how you are doing. Women should inform their doctor if they wish to become pregnant or think they might be pregnant. There is a potential for serious side effects to an unborn child. Talk to your health care professional or pharmacist for more information. You should make sure that you get enough calcium and vitamin D while you are taking this medicine. Discuss the foods you eat and the vitamins you take with your health care professional. Some people who take this medicine have severe bone, joint, and/or muscle pain. This medicine may also increase your risk for a broken thigh bone. Tell your doctor right away if you have pain in your upper leg or groin. Tell your doctor if you have any pain that does not go away or that gets worse. What side effects may I notice from receiving this medicine? Side effects that you should report to your doctor or health care professional as soon as possible: -allergic reactions like skin rash, itching or hives, swelling of the face, lips, or tongue -anxiety, confusion, or depression -breathing problems -changes in vision -feeling faint or lightheaded, falls -jaw burning, cramping, pain -muscle cramps, stiffness, or weakness -trouble passing urine or change in the amount of urine Side effects that usually do not require medical attention (report to your doctor or health care professional   if they continue or are bothersome): -bone, joint, or muscle pain -fever -hair loss -irritation at site where injected -loss of appetite -nausea, vomiting -stomach upset -tired This list may not describe all possible side effects. Call your doctor for medical advice about side effects. You may report side effects to FDA at 1-800-FDA-1088. Where should I keep my medicine? This drug is given in a hospital or clinic and will not be stored at home. NOTE: This sheet is a  summary. It may not cover all possible information. If you have questions about this medicine, talk to your doctor, pharmacist, or health care provider.  2012, [employer] Standard. (04/12/2011 9:06:58 AM) 

## 2013-03-18 NOTE — Progress Notes (Signed)
This office note has been dictated.

## 2013-03-18 NOTE — Progress Notes (Signed)
Reissued Flonase Nasal Spray rx to a local pharmacy.

## 2013-03-18 NOTE — Progress Notes (Signed)
Patient tolerated treatment well.

## 2013-03-18 NOTE — Addendum Note (Signed)
Addended by: Alyson Locket N on: 03/18/2013 11:53 AM   Modules accepted: Orders, Medications

## 2013-03-19 NOTE — Progress Notes (Signed)
CC:   Duke Salvia. Marcelle Overlie, M.D. Sandria Bales. Ezzard Standing, M.D.  DIAGNOSES: 1. Stage IIB (T2 N1 M0) ductal carcinoma of the right breast. 2. History of left subclavian vein deep venous thrombosis.  CURRENT THERAPY: 1. Maintenance Herceptin q.3 weeks. 2. Xarelto 20 mg p.o. daily, the patient to finish up in August of     2014. 3.  INTERIM HISTORY:  Ms. Hommes comes in for her followup.  She is doing pretty well.  She has had no complaints.  She is tolerating Herceptin without any problems.  There has been no cough or shortness of breath. The patient did have a little bit of a sore throat.  She has sinus drainage.  She says that the pollen has really affected her sinuses this year.  I will go ahead and give her some nasal spray.  Of note, her last MUGA scan was done back in February.  Her ejection fraction was about 50%.  I think we probably need to get another one set up for her next month.  She has had no leg swelling.  There has been no change in bowel or bladder habits.  She has had no fever, sweats or chills.  PHYSICAL EXAMINATION:  General:  This is a well-developed, well- nourished African American female in no obvious distress.  Vital signs: Show a temperature of 98, pulse 80, respiratory rate 16, blood pressure 131/86.  Weight is 157.  Head and neck:  Shows a normocephalic, atraumatic skull.  There are no ocular or oral lesions.  There are no palpable cervical or supraclavicular lymph nodes.  Lungs:  Clear to percussion and auscultation bilaterally.  Cardiac:  Regular rate and rhythm with a normal S1, S2.  There are no murmurs, rubs or bruits. Abdomen:  Soft with good bowel sounds.  There is no palpable abdominal mass.  There is no fluid wave.  There is no palpable hepatosplenomegaly. Breasts:  Show left breast with no masses, edema or erythema.  There is no left axillary adenopathy.  Right chest wall shows well-healed mastectomy.  There is some hyperpigmentation on the right  anterior chest wall.  No nodularity is noted on the chest wall.  There is no right axillary adenopathy.  Back:  Shows no tenderness over the spine, ribs or hips.  Extremities:  Show no clubbing, cyanosis or edema.  No lymphedema is noted in the in the left arm.  There may be some slight lymphedema in the right arm.  Neurological:  Shows no focal neurological deficits.  LABORATORY STUDIES:  White cell count 6.8, hemoglobin 12.2, hematocrit 36.9, platelet count 229.  IMPRESSION:  Ms. Ridgely is a 62 year old African American female with stage IIB ductal carcinoma of the right breast.  She was node positive. She had 3 positive lymph nodes.  She completed 6 cycles of chemotherapy with Taxotere/carboplatin/Herceptin.  The patient is on maintenance Herceptin.  She will finish this up in August.  Again, the DVT of the left subclavian vein clearly has resolved.  There is no clinical problem that she has with this.  I want to complete her Xarelto in August.  We will have her come back next month.  She will have Herceptin.  We will also get a MUGA scan on her.  I want to make sure that we continue to follow along her ejection fraction.    ______________________________ Josph Macho, M.D. PRE/MEDQ  D:  03/18/2013  T:  03/19/2013  Job:  [id number]

## 2013-04-06 ENCOUNTER — Encounter (HOSPITAL_COMMUNITY): Payer: Self-pay

## 2013-04-06 ENCOUNTER — Encounter (HOSPITAL_COMMUNITY)
Admission: RE | Admit: 2013-04-06 | Discharge: 2013-04-06 | Disposition: A | Discharge status: Home / Self Care | Payer: Federal, State, Local not specified - PPO | Source: Ambulatory Visit | Attending: Hematology & Oncology | Admitting: Hematology & Oncology

## 2013-04-06 DIAGNOSIS — C50911 Malignant neoplasm of unspecified site of right female breast: Secondary | ICD-10-CM

## 2013-04-06 DIAGNOSIS — C50919 Malignant neoplasm of unspecified site of unspecified female breast: Secondary | ICD-10-CM | POA: Insufficient documentation

## 2013-04-06 MED ORDER — TECHNETIUM TC 99M-LABELED RED BLOOD CELLS IV KIT
24.9000 | PACK | Freq: Once | INTRAVENOUS | Status: AC | PRN
  Administered 2013-04-06: 24.9 via INTRAVENOUS

## 2013-04-08 ENCOUNTER — Other Ambulatory Visit (HOSPITAL_BASED_OUTPATIENT_CLINIC_OR_DEPARTMENT_OTHER): Payer: Federal, State, Local not specified - PPO | Admitting: Lab

## 2013-04-08 ENCOUNTER — Ambulatory Visit: Payer: Federal, State, Local not specified - PPO | Admitting: Medical

## 2013-04-08 ENCOUNTER — Other Ambulatory Visit: Payer: Federal, State, Local not specified - PPO | Admitting: Lab

## 2013-04-08 ENCOUNTER — Ambulatory Visit (HOSPITAL_BASED_OUTPATIENT_CLINIC_OR_DEPARTMENT_OTHER): Payer: Federal, State, Local not specified - PPO

## 2013-04-08 ENCOUNTER — Ambulatory Visit (HOSPITAL_BASED_OUTPATIENT_CLINIC_OR_DEPARTMENT_OTHER): Payer: Federal, State, Local not specified - PPO | Admitting: Medical

## 2013-04-08 VITALS — BP 131/83 | HR 64 | Temp 97.9°F | Resp 16 | Ht 65.0 in | Wt 159.0 lb

## 2013-04-08 DIAGNOSIS — C773 Secondary and unspecified malignant neoplasm of axilla and upper limb lymph nodes: Secondary | ICD-10-CM

## 2013-04-08 DIAGNOSIS — C50519 Malignant neoplasm of lower-outer quadrant of unspecified female breast: Secondary | ICD-10-CM

## 2013-04-08 DIAGNOSIS — C50911 Malignant neoplasm of unspecified site of right female breast: Secondary | ICD-10-CM

## 2013-04-08 DIAGNOSIS — I890 Lymphedema, not elsewhere classified: Secondary | ICD-10-CM

## 2013-04-08 DIAGNOSIS — I82B19 Acute embolism and thrombosis of unspecified subclavian vein: Secondary | ICD-10-CM

## 2013-04-08 DIAGNOSIS — Z5112 Encounter for antineoplastic immunotherapy: Secondary | ICD-10-CM

## 2013-04-08 LAB — CBC WITH DIFFERENTIAL (CANCER CENTER ONLY)
BASO#: 0 10*3/uL (ref 0.0–0.2)
BASO%: 0.2 % (ref 0.0–2.0)
EOS%: 1.1 % (ref 0.0–7.0)
HCT: 34.9 % (ref 34.8–46.6)
HGB: 11.4 g/dL — ABNORMAL LOW (ref 11.6–15.9)
LYMPH#: 1.5 10*3/uL (ref 0.9–3.3)
MONO#: 0.4 10*3/uL (ref 0.1–0.9)
NEUT#: 3.2 10*3/uL (ref 1.5–6.5)
NEUT%: 61.3 % (ref 39.6–80.0)
RBC: 3.59 10*6/uL — ABNORMAL LOW (ref 3.70–5.32)
WBC: 5.2 10*3/uL (ref 3.9–10.0)

## 2013-04-08 LAB — COMPREHENSIVE METABOLIC PANEL
ALT: 12 U/L (ref 0–35)
AST: 20 U/L (ref 0–37)
Albumin: 4.3 g/dL (ref 3.5–5.2)
BUN: 12 mg/dL (ref 6–23)
CO2: 27 mEq/L (ref 19–32)
Calcium: 10.2 mg/dL (ref 8.4–10.5)
Chloride: 104 mEq/L (ref 96–112)
Creatinine, Ser: 0.8 mg/dL (ref 0.50–1.10)
Potassium: 4.2 mEq/L (ref 3.5–5.3)

## 2013-04-08 MED ORDER — TRASTUZUMAB CHEMO INJECTION 440 MG
6.0000 mg/kg | Freq: Once | INTRAVENOUS | Status: AC
  Administered 2013-04-08: 420 mg via INTRAVENOUS
  Filled 2013-04-08: qty 20

## 2013-04-08 MED ORDER — DIPHENHYDRAMINE HCL 25 MG PO CAPS
50.0000 mg | ORAL_CAPSULE | Freq: Once | ORAL | Status: AC
  Administered 2013-04-08: 50 mg via ORAL

## 2013-04-08 MED ORDER — ACETAMINOPHEN 325 MG PO TABS
650.0000 mg | ORAL_TABLET | Freq: Once | ORAL | Status: AC
  Administered 2013-04-08: 650 mg via ORAL

## 2013-04-08 MED ORDER — SODIUM CHLORIDE 0.9 % IV SOLN
Freq: Once | INTRAVENOUS | Status: AC
  Administered 2013-04-08: 11:00:00 via INTRAVENOUS

## 2013-04-08 NOTE — Patient Instructions (Signed)
Trastuzumab injection for infusion What is this medicine? TRASTUZUMAB (tras TOO zoo mab) is a monoclonal antibody. It targets a protein called HER2. This protein is found in some stomach and breast cancers. This medicine can stop cancer cell growth. This medicine may be used with other cancer treatments. This medicine may be used for other purposes; ask your health care provider or pharmacist if you have questions. What should I tell my health care provider before I take this medicine? They need to know if you have any of these conditions: -heart disease -heart failure -infection (especially a virus infection such as chickenpox, cold sores, or herpes) -lung or breathing disease, like asthma -recent or ongoing radiation therapy -an unusual or allergic reaction to trastuzumab, benzyl alcohol, or other medications, foods, dyes, or preservatives -pregnant or trying to get pregnant -breast-feeding How should I use this medicine? This drug is given as an infusion into a vein. It is administered in a hospital or clinic by a specially trained health care professional. Talk to your pediatrician regarding the use of this medicine in children. This medicine is not approved for use in children. Overdosage: If you think you have taken too much of this medicine contact a poison control center or emergency room at once. NOTE: This medicine is only for you. Do not share this medicine with others. What if I miss a dose? It is important not to miss a dose. Call your doctor or health care professional if you are unable to keep an appointment. What may interact with this medicine? -cyclophosphamide -doxorubicin -warfarin This list may not describe all possible interactions. Give your health care provider a list of all the medicines, herbs, non-prescription drugs, or dietary supplements you use. Also tell them if you smoke, drink alcohol, or use illegal drugs. Some items may interact with your medicine. What  should I watch for while using this medicine? Visit your doctor for checks on your progress. Report any side effects. Continue your course of treatment even though you feel ill unless your doctor tells you to stop. Call your doctor or health care professional for advice if you get a fever, chills or sore throat, or other symptoms of a cold or flu. Do not treat yourself. Try to avoid being around people who are sick. You may experience fever, chills and shaking during your first infusion. These effects are usually mild and can be treated with other medicines. Report any side effects during the infusion to your health care professional. Fever and chills usually do not happen with later infusions. What side effects may I notice from receiving this medicine? Side effects that you should report to your doctor or other health care professional as soon as possible: -breathing difficulties -chest pain or palpitations -cough -dizziness or fainting -fever or chills, sore throat -skin rash, itching or hives -swelling of the legs or ankles -unusually weak or tired Side effects that usually do not require medical attention (report to your doctor or other health care professional if they continue or are bothersome): -loss of appetite -headache -muscle aches -nausea This list may not describe all possible side effects. Call your doctor for medical advice about side effects. You may report side effects to FDA at 1-800-FDA-1088. Where should I keep my medicine? This drug is given in a hospital or clinic and will not be stored at home. NOTE: This sheet is a summary. It may not cover all possible information. If you have questions about this medicine, talk to your doctor, pharmacist,   or health care provider.  2012, [employer] Standard. (08/18/2009 1:43:15 PM)Zoledronic Acid injection (Hypercalcemia, Oncology) What is this medicine? ZOLEDRONIC ACID (ZOE le dron ik AS id) lowers the amount of calcium loss  from bone. It is used to treat too much calcium in your blood from cancer. It is also used to prevent complications of cancer that has spread to the bone. This medicine may be used for other purposes; ask your health care provider or pharmacist if you have questions. What should I tell my health care provider before I take this medicine? They need to know if you have any of these conditions: -aspirin-sensitive asthma -dental disease -kidney disease -an unusual or allergic reaction to zoledronic acid, other medicines, foods, dyes, or preservatives -pregnant or trying to get pregnant -breast-feeding How should I use this medicine? This medicine is for infusion into a vein. It is given by a health care professional in a hospital or clinic setting. Talk to your pediatrician regarding the use of this medicine in children. Special care may be needed. Overdosage: If you think you have taken too much of this medicine contact a poison control center or emergency room at once. NOTE: This medicine is only for you. Do not share this medicine with others. What if I miss a dose? It is important not to miss your dose. Call your doctor or health care professional if you are unable to keep an appointment. What may interact with this medicine? -certain antibiotics given by injection -NSAIDs, medicines for pain and inflammation, like ibuprofen or naproxen -some diuretics like bumetanide, furosemide -teriparatide -thalidomide This list may not describe all possible interactions. Give your health care provider a list of all the medicines, herbs, non-prescription drugs, or dietary supplements you use. Also tell them if you smoke, drink alcohol, or use illegal drugs. Some items may interact with your medicine. What should I watch for while using this medicine? Visit your doctor or health care professional for regular checkups. It may be some time before you see the benefit from this medicine. Do not stop taking  your medicine unless your doctor tells you to. Your doctor may order blood tests or other tests to see how you are doing. Women should inform their doctor if they wish to become pregnant or think they might be pregnant. There is a potential for serious side effects to an unborn child. Talk to your health care professional or pharmacist for more information. You should make sure that you get enough calcium and vitamin D while you are taking this medicine. Discuss the foods you eat and the vitamins you take with your health care professional. Some people who take this medicine have severe bone, joint, and/or muscle pain. This medicine may also increase your risk for a broken thigh bone. Tell your doctor right away if you have pain in your upper leg or groin. Tell your doctor if you have any pain that does not go away or that gets worse. What side effects may I notice from receiving this medicine? Side effects that you should report to your doctor or health care professional as soon as possible: -allergic reactions like skin rash, itching or hives, swelling of the face, lips, or tongue -anxiety, confusion, or depression -breathing problems -changes in vision -feeling faint or lightheaded, falls -jaw burning, cramping, pain -muscle cramps, stiffness, or weakness -trouble passing urine or change in the amount of urine Side effects that usually do not require medical attention (report to your doctor or health care professional  if they continue or are bothersome): -bone, joint, or muscle pain -fever -hair loss -irritation at site where injected -loss of appetite -nausea, vomiting -stomach upset -tired This list may not describe all possible side effects. Call your doctor for medical advice about side effects. You may report side effects to FDA at 1-800-FDA-1088. Where should I keep my medicine? This drug is given in a hospital or clinic and will not be stored at home. NOTE: This sheet is a  summary. It may not cover all possible information. If you have questions about this medicine, talk to your doctor, pharmacist, or health care provider.  2012, [employer] Standard. (04/12/2011 9:06:58 AM)

## 2013-04-08 NOTE — Progress Notes (Signed)
DIAGNOSES: 1. Stage IIB (T2 N1 M0) ductal carcinoma of the right breast. 2. Left subclavian vein deep vein thrombosis. 3. Lymphedema involving the right hand  CURRENT THERAPY: 1. Maintenance Herceptin every 3 weeks. 2. Xarelto 20 mg p.o. daily.  INTERIM HISTORY: Maureen Duncan presents today for an office followup visit.  Overall, she's doing quite well.  She's not has no specific complaints.  She remains on Herceptin every 3 weeks.  She's not having any problems with Herceptin.  She did have a MUGA scan back on June 11 which revealed 50% ejection fraction.  She's also on Xarelto without any problems.  She does not report any issues with bleeding.  She has some mild lymphedema of the right arm.  She does have a lymphedema sleeve.  She has no swelling of the left arm from the subclavian DVT.  She has a good appetite.  She denies any nausea, vomiting, diarrhea, constipation, chest pain, shortness of breath, cough, fevers, chills, or night sweats.  She denies any abdominal pain, any lower leg swelling.  She denies any obvious, or abnormal bleeding.  She denies any headaches, visual changes, or rashes.  Review of Systems: Constitutional:Negative for malaise/fatigue, fever, chills, weight loss, diaphoresis, activity change, appetite change, and unexpected weight change.  HEENT: Negative for double vision, blurred vision, visual loss, ear pain, tinnitus, congestion, rhinorrhea, epistaxis sore throat or sinus disease, oral pain/lesion, tongue soreness Respiratory: Negative for cough, chest tightness, shortness of breath, wheezing and stridor.  Cardiovascular: Negative for chest pain, palpitations, leg swelling, orthopnea, PND, DOE or claudication Gastrointestinal: Negative for nausea, vomiting, abdominal pain, diarrhea, constipation, blood in stool, melena, hematochezia, abdominal distention, anal bleeding, rectal pain, anorexia and hematemesis.  Genitourinary: Negative for dysuria, frequency, hematuria,   Musculoskeletal: Negative for myalgias, back pain, joint swelling, arthralgias and gait problem.  Skin: Negative for rash, color change, pallor and wound.  Neurological:. Negative for dizziness/light-headedness, tremors, seizures, syncope, facial asymmetry, speech difficulty, weakness, numbness, headaches and paresthesias.  Hematological: Negative for adenopathy. Does not bruise/bleed easily.  Psychiatric/Behavioral:  Negative for depression, no loss of interest in normal activity or change in sleep pattern.   Physical Exam: This is a pleasant, 62 year old, well-developed, well-nourished Afro-American female, in no obvious distress Vitals: Temperature 97.9 degrees pulse 64 respirations 16 blood pressure 131/83 weight 159 pounds HEENT reveals a normocephalic, atraumatic skull, no scleral icterus, no oral lesions  Neck is supple without any cervical or supraclavicular adenopathy.  Lungs are clear to auscultation bilaterally. There are no wheezes, rales or rhonci Cardiac is regular rate and rhythm with a normal S1 and S2. There are no murmurs, rubs, or bruits.  Abdomen is soft with good bowel sounds, there is no palpable mass. There is no palpable hepatosplenomegaly. There is no palpable fluid wave.  Musculoskeletal no tenderness of the spine, ribs, or hips.  Extremities there are no clubbing, cyanosis, or edema.  Skin no petechia, purpura or ecchymosis Neurologic is nonfocal.  Laboratory Data: White count 5.2 hemoglobin 11.4 hematocrit 34.9 platelets 271,000  Current Outpatient Prescriptions on File Prior to Visit  Medication Sig Dispense Refill  . Alum & Mag Hydroxide-Simeth (MAGIC MOUTHWASH W/LIDOCAINE) SOLN Take 5 mLs by mouth 4 (four) times daily as needed. Swish and Spit.  240 mL  1  . benzonatate (TESSALON) 100 MG capsule Take 1 capsule (100 mg total) by mouth 2 (two) times daily as needed. Cough  30 capsule  0  . diphenhydrAMINE (BENADRYL) 25 MG tablet Take 25 mg by mouth every  6  (six) hours as needed.      . loratadine (CLARITIN) 10 MG tablet Take 1 tablet (10 mg total) by mouth daily.  30 tablet  11  . LORazepam (ATIVAN) 0.5 MG tablet 0.5 mg as needed.       . Rivaroxaban (XARELTO) 20 MG TABS Take 1 tablet (20 mg total) by mouth daily.  30 tablet  3  . traMADol (ULTRAM) 50 MG tablet every 8 (eight) hours as needed.       . [DISCONTINUED] calcium-vitamin D (OSCAL) 250-125 MG-UNIT per tablet Take 1 tablet by mouth daily.          Assessment/Plan: This is a pleasant, 62 year old, African American, female, with the following issues:  #1.  Stage IIb infiltrating ductal carcinoma of the right breast.  She underwent a mastectomy.  She is ER negative and HER-2, positive.  She did have 3 positive lymph nodes.  She completed 6 cycles of chemotherapy with Taxotere/carboplatin/Herceptin. She currently is on maintenance Herceptin.  She will complete one year Herceptin August of this year.  She receives Herceptin every 3 weeks.  #2.  Left subclavian vein, deep vein thrombosis.  She remains on Xarelto.  She will complete her anticoagulation August of this year.  #3.  Lymphedema involving the right hand.  She does have a lymphedema sleeve.  #4.  Followup.  We will follow back up with Maureen Duncan in 6 weeks, but before then should there be questions or concerns.

## 2013-04-09 ENCOUNTER — Telehealth: Payer: Self-pay | Admitting: *Deleted

## 2013-04-09 NOTE — Telephone Encounter (Addendum)
Message copied by Mirian Capuchin on Fri Apr 09, 2013  9:52 AM ------      Message from: Arlan Organ R      Created: Wed Apr 07, 2013  1:14 PM       Call -heart is still pumping strong!!!  Maureen Duncan ------This message left on pt's home/work/mobile phone number.

## 2013-04-29 ENCOUNTER — Other Ambulatory Visit: Payer: Federal, State, Local not specified - PPO | Admitting: Lab

## 2013-04-29 ENCOUNTER — Ambulatory Visit (HOSPITAL_BASED_OUTPATIENT_CLINIC_OR_DEPARTMENT_OTHER): Payer: Federal, State, Local not specified - PPO

## 2013-04-29 VITALS — BP 130/80 | HR 64 | Temp 97.0°F | Resp 20

## 2013-04-29 DIAGNOSIS — Z5112 Encounter for antineoplastic immunotherapy: Secondary | ICD-10-CM

## 2013-04-29 DIAGNOSIS — C50519 Malignant neoplasm of lower-outer quadrant of unspecified female breast: Secondary | ICD-10-CM

## 2013-04-29 DIAGNOSIS — C50911 Malignant neoplasm of unspecified site of right female breast: Secondary | ICD-10-CM

## 2013-04-29 DIAGNOSIS — C773 Secondary and unspecified malignant neoplasm of axilla and upper limb lymph nodes: Secondary | ICD-10-CM

## 2013-04-29 MED ORDER — DIPHENHYDRAMINE HCL 25 MG PO CAPS
50.0000 mg | ORAL_CAPSULE | Freq: Once | ORAL | Status: AC
  Administered 2013-04-29: 50 mg via ORAL

## 2013-04-29 MED ORDER — TRASTUZUMAB CHEMO INJECTION 440 MG
6.0000 mg/kg | Freq: Once | INTRAVENOUS | Status: AC
  Administered 2013-04-29: 420 mg via INTRAVENOUS
  Filled 2013-04-29: qty 20

## 2013-04-29 MED ORDER — SODIUM CHLORIDE 0.9 % IV SOLN
Freq: Once | INTRAVENOUS | Status: AC
  Administered 2013-04-29: 14:00:00 via INTRAVENOUS

## 2013-04-29 MED ORDER — ACETAMINOPHEN 325 MG PO TABS
650.0000 mg | ORAL_TABLET | Freq: Once | ORAL | Status: AC
  Administered 2013-04-29: 650 mg via ORAL

## 2013-04-29 NOTE — Patient Instructions (Signed)

## 2013-05-20 ENCOUNTER — Other Ambulatory Visit (HOSPITAL_BASED_OUTPATIENT_CLINIC_OR_DEPARTMENT_OTHER): Payer: Federal, State, Local not specified - PPO | Admitting: Lab

## 2013-05-20 ENCOUNTER — Ambulatory Visit (HOSPITAL_BASED_OUTPATIENT_CLINIC_OR_DEPARTMENT_OTHER): Payer: Federal, State, Local not specified - PPO

## 2013-05-20 ENCOUNTER — Ambulatory Visit (HOSPITAL_BASED_OUTPATIENT_CLINIC_OR_DEPARTMENT_OTHER): Payer: Federal, State, Local not specified - PPO | Admitting: Hematology & Oncology

## 2013-05-20 VITALS — BP 124/82 | HR 63 | Temp 98.0°F | Resp 16 | Ht 65.0 in | Wt 161.0 lb

## 2013-05-20 DIAGNOSIS — C773 Secondary and unspecified malignant neoplasm of axilla and upper limb lymph nodes: Secondary | ICD-10-CM

## 2013-05-20 DIAGNOSIS — C50911 Malignant neoplasm of unspecified site of right female breast: Secondary | ICD-10-CM

## 2013-05-20 DIAGNOSIS — C50519 Malignant neoplasm of lower-outer quadrant of unspecified female breast: Secondary | ICD-10-CM

## 2013-05-20 DIAGNOSIS — Z5112 Encounter for antineoplastic immunotherapy: Secondary | ICD-10-CM

## 2013-05-20 DIAGNOSIS — I82B19 Acute embolism and thrombosis of unspecified subclavian vein: Secondary | ICD-10-CM

## 2013-05-20 LAB — CBC WITH DIFFERENTIAL (CANCER CENTER ONLY)
BASO#: 0 10*3/uL (ref 0.0–0.2)
EOS%: 1.5 % (ref 0.0–7.0)
Eosinophils Absolute: 0.1 10*3/uL (ref 0.0–0.5)
HCT: 36.9 % (ref 34.8–46.6)
HGB: 12 g/dL (ref 11.6–15.9)
LYMPH#: 2 10*3/uL (ref 0.9–3.3)
MCHC: 32.5 g/dL (ref 32.0–36.0)
NEUT#: 2.6 10*3/uL (ref 1.5–6.5)
NEUT%: 50.3 % (ref 39.6–80.0)
RBC: 3.82 10*6/uL (ref 3.70–5.32)

## 2013-05-20 LAB — COMPREHENSIVE METABOLIC PANEL
Albumin: 4 g/dL (ref 3.5–5.2)
BUN: 11 mg/dL (ref 6–23)
CO2: 29 mEq/L (ref 19–32)
Calcium: 9.2 mg/dL (ref 8.4–10.5)
Chloride: 103 mEq/L (ref 96–112)
Creatinine, Ser: 0.77 mg/dL (ref 0.50–1.10)
Glucose, Bld: 67 mg/dL — ABNORMAL LOW (ref 70–99)
Potassium: 3.6 mEq/L (ref 3.5–5.3)

## 2013-05-20 MED ORDER — SODIUM CHLORIDE 0.9 % IV SOLN
Freq: Once | INTRAVENOUS | Status: AC
  Administered 2013-05-20: 14:00:00 via INTRAVENOUS

## 2013-05-20 MED ORDER — TRASTUZUMAB CHEMO INJECTION 440 MG
6.0000 mg/kg | Freq: Once | INTRAVENOUS | Status: AC
  Administered 2013-05-20: 420 mg via INTRAVENOUS
  Filled 2013-05-20: qty 20

## 2013-05-20 MED ORDER — ACETAMINOPHEN 325 MG PO TABS
650.0000 mg | ORAL_TABLET | Freq: Once | ORAL | Status: AC
  Administered 2013-05-20: 650 mg via ORAL

## 2013-05-20 MED ORDER — DIPHENHYDRAMINE HCL 25 MG PO CAPS
50.0000 mg | ORAL_CAPSULE | Freq: Once | ORAL | Status: AC
  Administered 2013-05-20: 50 mg via ORAL

## 2013-05-20 NOTE — Progress Notes (Signed)
This office note has been dictated.

## 2013-05-21 NOTE — Progress Notes (Signed)
DIAGNOSES: 1. Stage IIB (T2 N1 M0) ductal carcinoma of the right breast. 2. Deep venous thrombosis of the left subclavian vein.  CURRENT THERAPY: 1. Herceptin q.3 week dosing. 2. Xarelto 20 mg p.o. q. day.  INTERIM HISTORY:  Maureen Duncan comes in for followup.  She continues to do quite well.  She will finish up her Xarelto at the end of August.  She would have been on Xarelto for a year.  The patient finishes up Herceptin in September.  She has had no fatigue or weakness.  She has had no nausea or vomiting. There has been no bleeding.  There has been no cough or shortness of breath.  She has not noticed any swelling in her extremities, particularly her right hand.  Overall, her performance status is ECOG 0.  PHYSICAL EXAMINATION:  General:  This is a well-developed, well- nourished African American female in no obvious distress.  Vital signs: Temperature of 98, pulse 63, respiratory rate 16, blood pressure 124/82. Weight is 161.  Head and neck:  Normocephalic, atraumatic skull.  There are no ocular or oral lesions.  There are no palpable cervical or supraclavicular lymph nodes.  Lungs:  Clear bilaterally.  Cardiac: Regular rate and rhythm with a normal S1 and S2.  There are no murmurs, rubs, or bruits.  Abdomen:  Soft.  She has good bowel sounds.  There is no fluid wave.  There is no palpable hepatosplenomegaly.  Breasts:  Left breast with no masses, edema, or erythema.  There is no left axillary adenopathy.  Right chest wall shows well-healed mastectomy.  She has some hyperpigmentation at the mastectomy site.  She has no distinct mass on the right chest wall.  There is no nodularity of the right chest wall.  There is no right axillary adenopathy.  Back:  No tenderness of the spine, ribs, or hips.  Extremities:  No clubbing, cyanosis, or edema.  Skin:  No rashes, ecchymoses, or petechiae.  LABORATORY STUDIES:  White cell count of 5.2, hemoglobin 12, hematocrit 37, platelet count  157.  IMPRESSION:  Maureen Duncan is a very nice 62 year old African American female with a history of stage IIB infiltrating ductal carcinoma of the right breast.  She had a mastectomy.  She had 3 positive lymph nodes.  PLAN: 1. She is on maintenance Herceptin right now.  Again, she will     complete this in September. 2. She is on Xarelto right now.  She will complete 1 year of Xarelto     at the end of August. 3. We will go ahead and have her come back in 3 weeks just for     Herceptin and labs. 4. I will plan see her back in 6 weeks' time.  At that point, it     should be her last Herceptin, and we will stop her Xarelto.    ______________________________ Josph Macho, M.D. PRE/MEDQ  D:  05/20/2013  T:  05/21/2013  Job:  [id number]

## 2013-06-09 ENCOUNTER — Other Ambulatory Visit: Payer: Self-pay | Admitting: *Deleted

## 2013-06-09 DIAGNOSIS — C50911 Malignant neoplasm of unspecified site of right female breast: Secondary | ICD-10-CM

## 2013-06-10 ENCOUNTER — Other Ambulatory Visit (HOSPITAL_BASED_OUTPATIENT_CLINIC_OR_DEPARTMENT_OTHER): Payer: Federal, State, Local not specified - PPO | Admitting: Lab

## 2013-06-10 ENCOUNTER — Ambulatory Visit (HOSPITAL_BASED_OUTPATIENT_CLINIC_OR_DEPARTMENT_OTHER): Payer: Federal, State, Local not specified - PPO

## 2013-06-10 VITALS — BP 124/72 | HR 81 | Temp 98.5°F | Resp 18

## 2013-06-10 DIAGNOSIS — C50911 Malignant neoplasm of unspecified site of right female breast: Secondary | ICD-10-CM

## 2013-06-10 DIAGNOSIS — C50519 Malignant neoplasm of lower-outer quadrant of unspecified female breast: Secondary | ICD-10-CM

## 2013-06-10 DIAGNOSIS — Z5112 Encounter for antineoplastic immunotherapy: Secondary | ICD-10-CM

## 2013-06-10 DIAGNOSIS — C773 Secondary and unspecified malignant neoplasm of axilla and upper limb lymph nodes: Secondary | ICD-10-CM

## 2013-06-10 LAB — CBC WITH DIFFERENTIAL (CANCER CENTER ONLY)
BASO#: 0 10*3/uL (ref 0.0–0.2)
Eosinophils Absolute: 0 10*3/uL (ref 0.0–0.5)
HCT: 36.8 % (ref 34.8–46.6)
HGB: 12.2 g/dL (ref 11.6–15.9)
LYMPH%: 33.5 % (ref 14.0–48.0)
MCV: 96 fL (ref 81–101)
MONO#: 0.5 10*3/uL (ref 0.1–0.9)
Platelets: 242 10*3/uL (ref 145–400)
RBC: 3.85 10*6/uL (ref 3.70–5.32)
WBC: 6.5 10*3/uL (ref 3.9–10.0)

## 2013-06-10 MED ORDER — ACETAMINOPHEN 325 MG PO TABS
650.0000 mg | ORAL_TABLET | Freq: Once | ORAL | Status: AC
  Administered 2013-06-10: 650 mg via ORAL

## 2013-06-10 MED ORDER — SODIUM CHLORIDE 0.9 % IV SOLN
Freq: Once | INTRAVENOUS | Status: AC
  Administered 2013-06-10: 13:00:00 via INTRAVENOUS

## 2013-06-10 MED ORDER — TRASTUZUMAB CHEMO INJECTION 440 MG
6.0000 mg/kg | Freq: Once | INTRAVENOUS | Status: AC
  Administered 2013-06-10: 420 mg via INTRAVENOUS
  Filled 2013-06-10: qty 20

## 2013-06-10 MED ORDER — DIPHENHYDRAMINE HCL 25 MG PO CAPS
50.0000 mg | ORAL_CAPSULE | Freq: Once | ORAL | Status: AC
  Administered 2013-06-10: 50 mg via ORAL

## 2013-06-10 NOTE — Patient Instructions (Signed)

## 2013-07-01 ENCOUNTER — Ambulatory Visit (HOSPITAL_BASED_OUTPATIENT_CLINIC_OR_DEPARTMENT_OTHER): Payer: Federal, State, Local not specified - PPO

## 2013-07-01 ENCOUNTER — Ambulatory Visit (HOSPITAL_BASED_OUTPATIENT_CLINIC_OR_DEPARTMENT_OTHER): Payer: Federal, State, Local not specified - PPO | Admitting: Hematology & Oncology

## 2013-07-01 ENCOUNTER — Other Ambulatory Visit (HOSPITAL_BASED_OUTPATIENT_CLINIC_OR_DEPARTMENT_OTHER): Payer: Federal, State, Local not specified - PPO | Admitting: Lab

## 2013-07-01 VITALS — BP 126/96 | HR 86 | Temp 98.1°F | Resp 14 | Ht 65.0 in | Wt 159.0 lb

## 2013-07-01 DIAGNOSIS — C773 Secondary and unspecified malignant neoplasm of axilla and upper limb lymph nodes: Secondary | ICD-10-CM

## 2013-07-01 DIAGNOSIS — C50911 Malignant neoplasm of unspecified site of right female breast: Secondary | ICD-10-CM

## 2013-07-01 DIAGNOSIS — Z5112 Encounter for antineoplastic immunotherapy: Secondary | ICD-10-CM

## 2013-07-01 DIAGNOSIS — I82B19 Acute embolism and thrombosis of unspecified subclavian vein: Secondary | ICD-10-CM

## 2013-07-01 DIAGNOSIS — C50519 Malignant neoplasm of lower-outer quadrant of unspecified female breast: Secondary | ICD-10-CM

## 2013-07-01 LAB — COMPREHENSIVE METABOLIC PANEL
CO2: 26 mEq/L (ref 19–32)
Creatinine, Ser: 0.79 mg/dL (ref 0.50–1.10)
Glucose, Bld: 78 mg/dL (ref 70–99)
Total Bilirubin: 0.6 mg/dL (ref 0.3–1.2)

## 2013-07-01 LAB — CBC WITH DIFFERENTIAL (CANCER CENTER ONLY)
Eosinophils Absolute: 0.1 10*3/uL (ref 0.0–0.5)
HCT: 36.5 % (ref 34.8–46.6)
LYMPH%: 32.9 % (ref 14.0–48.0)
MCV: 97 fL (ref 81–101)
MONO#: 0.6 10*3/uL (ref 0.1–0.9)
NEUT%: 56.4 % (ref 39.6–80.0)
RDW: 13.4 % (ref 11.1–15.7)
WBC: 6.1 10*3/uL (ref 3.9–10.0)

## 2013-07-01 MED ORDER — TRASTUZUMAB CHEMO INJECTION 440 MG
6.0000 mg/kg | Freq: Once | INTRAVENOUS | Status: AC
  Administered 2013-07-01: 420 mg via INTRAVENOUS
  Filled 2013-07-01: qty 20

## 2013-07-01 MED ORDER — ACETAMINOPHEN 325 MG PO TABS
650.0000 mg | ORAL_TABLET | Freq: Once | ORAL | Status: AC
  Administered 2013-07-01: 650 mg via ORAL

## 2013-07-01 MED ORDER — SODIUM CHLORIDE 0.9 % IV SOLN
Freq: Once | INTRAVENOUS | Status: AC
  Administered 2013-07-01: 15:00:00 via INTRAVENOUS

## 2013-07-01 MED ORDER — DIPHENHYDRAMINE HCL 25 MG PO CAPS
50.0000 mg | ORAL_CAPSULE | Freq: Once | ORAL | Status: AC
  Administered 2013-07-01: 50 mg via ORAL

## 2013-07-01 NOTE — Progress Notes (Signed)
This office note has been dictated.

## 2013-07-01 NOTE — Patient Instructions (Signed)
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
## 2013-09-03 ENCOUNTER — Encounter (INDEPENDENT_AMBULATORY_CARE_PROVIDER_SITE_OTHER): Payer: Self-pay

## 2013-09-16 ENCOUNTER — Encounter (INDEPENDENT_AMBULATORY_CARE_PROVIDER_SITE_OTHER): Payer: Self-pay

## 2013-09-30 ENCOUNTER — Ambulatory Visit: Payer: Federal, State, Local not specified - PPO

## 2013-09-30 ENCOUNTER — Other Ambulatory Visit (HOSPITAL_BASED_OUTPATIENT_CLINIC_OR_DEPARTMENT_OTHER): Payer: Federal, State, Local not specified - PPO | Admitting: Lab

## 2013-09-30 ENCOUNTER — Ambulatory Visit (HOSPITAL_BASED_OUTPATIENT_CLINIC_OR_DEPARTMENT_OTHER): Payer: Federal, State, Local not specified - PPO | Admitting: Hematology & Oncology

## 2013-09-30 VITALS — BP 134/82 | HR 90 | Temp 98.2°F | Resp 16 | Ht 65.0 in | Wt 161.0 lb

## 2013-09-30 DIAGNOSIS — C50911 Malignant neoplasm of unspecified site of right female breast: Secondary | ICD-10-CM

## 2013-09-30 DIAGNOSIS — Z853 Personal history of malignant neoplasm of breast: Secondary | ICD-10-CM

## 2013-09-30 LAB — CBC WITH DIFFERENTIAL (CANCER CENTER ONLY)
BASO%: 0.3 % (ref 0.0–2.0)
EOS%: 1.4 % (ref 0.0–7.0)
HCT: 35.8 % (ref 34.8–46.6)
LYMPH%: 28.8 % (ref 14.0–48.0)
MCH: 31.3 pg (ref 26.0–34.0)
MCHC: 33 g/dL (ref 32.0–36.0)
MCV: 95 fL (ref 81–101)
MONO%: 10 % (ref 0.0–13.0)
NEUT%: 59.5 % (ref 39.6–80.0)
Platelets: 241 10*3/uL (ref 145–400)
RDW: 12.5 % (ref 11.1–15.7)

## 2013-09-30 MED ORDER — ASPIRIN EC 81 MG PO TBEC: 162.0000 mg | DELAYED_RELEASE_TABLET | Freq: Every day | ORAL | Status: DC

## 2013-09-30 NOTE — Progress Notes (Signed)
This office note has been dictated.

## 2013-09-30 NOTE — Addendum Note (Signed)
Addended by: Arlan Organ R on: 09/30/2013 12:14 PM   Modules accepted: Orders, Medications

## 2013-10-04 NOTE — Progress Notes (Signed)
DIAGNOSES: 1. Stage IIB (T2 N1 M0) ductal carcinoma of the right breast, ER     negative/PR positive. 2. History of deep venous thrombosis of the left subclavian vein.  CURRENT THERAPY:  Aspirin 162 mg p.o. daily.  INTERIM HISTORY:  Maureen Duncan comes in for followup.  She is doing quite well.  She completed a year of Herceptin back in September.  She did very well with this.  There have been no issues with respect to congestive heart failure.  Her last echocardiogram that we got done on her was back in October 2013. She had an ejection fraction of 45% to 50%.  We will have to see about maybe getting another one.  Her last MUGA scan was done in June. Ejection fraction was 50%.  There has been no fatigue or weakness.  There has been no swelling.  She has had no cough.  She has had no orthopnea.  She has had no change in bowel or bladder habits.  She has had no fever, sweats, or chills.  There have been no rashes. She has had no change in her medications.  PHYSICAL EXAMINATION:  General:  This is a well-developed,  well- nourished African American female in no obvious distress.  Vital Signs: Temperature of 98.2, pulse 90, respiratory rate 16, blood pressure 134/82.  Weight is 161 pounds.  Head and Neck:  Normocephalic, atraumatic skull.  There are no ocular or oral lesions.  There are no palpable cervical or supraclavicular lymph nodes.  Lungs:  Clear bilaterally.  Cardiac:  Regular rate and rhythm with normal S1 and S2. There are no murmurs, rubs, or bruits.  Abdomen:  Soft.  She has good bowel sounds.  There is no fluid wave.  There is no palpable abdominal mass.  There is no palpable hepatosplenomegaly.  Breast:  Left breast with no masses, edema, or erythema of the left breast.  There is no discrete mass in the left breast.  There is no left axillary adenopathy. Right chest wall shows well-healed mastectomy.  She has some radiation changes.  She has well-healed mastectomy scar.   There is no nodularity noted.  There is no right axillary adenopathy.  Back:  No tenderness over the spine, ribs, or hips.  Abdomen:  Soft.  She has good bowel sounds.  There is no fluid wave.  There is no palpable hepatosplenomegaly.  Extremities:  Some slight lymphedema of the right arm.  She has no edema of the left arm.  She has good range motion of her joints.  She has good muscle strength bilaterally.  LABORATORY STUDIES:  White cell count is 6.2, hemoglobin 11.8, hematocrit 35.8, platelet count 241.  IMPRESSION:  Maureen Duncan is a very charming 62 year old African American female.  She has stage IIB ductal carcinoma of the right breast.  She had 3 lymph nodes that were positive.  Her tumor was ER negative and PR positive.  We gave her adjuvant chemotherapy.  She then got a year of Herceptin.  I do not see any evidence of recurrent disease to date.  We will go ahead and plan to get her back in 3 more months.  I may consider doing a MUGA scan or echocardiogram in another 6 months so that we can follow up with her cardiac function.    ______________________________ Josph Macho, M.D. PRE/MEDQ  D:  09/30/2013  T:  10/04/2013  Job:  [id number]

## 2013-12-29 ENCOUNTER — Other Ambulatory Visit (HOSPITAL_BASED_OUTPATIENT_CLINIC_OR_DEPARTMENT_OTHER): Payer: Federal, State, Local not specified - PPO | Admitting: Lab

## 2013-12-29 ENCOUNTER — Ambulatory Visit (HOSPITAL_BASED_OUTPATIENT_CLINIC_OR_DEPARTMENT_OTHER): Payer: Federal, State, Local not specified - PPO | Admitting: Hematology & Oncology

## 2013-12-29 ENCOUNTER — Encounter: Payer: Self-pay | Admitting: Hematology & Oncology

## 2013-12-29 VITALS — BP 151/87 | HR 73 | Temp 97.8°F | Resp 14 | Ht 65.0 in | Wt 169.0 lb

## 2013-12-29 DIAGNOSIS — E785 Hyperlipidemia, unspecified: Secondary | ICD-10-CM

## 2013-12-29 DIAGNOSIS — C50911 Malignant neoplasm of unspecified site of right female breast: Secondary | ICD-10-CM

## 2013-12-29 DIAGNOSIS — C50919 Malignant neoplasm of unspecified site of unspecified female breast: Secondary | ICD-10-CM

## 2013-12-29 LAB — CMP (CANCER CENTER ONLY)
ALT: 13 U/L (ref 10–47)
AST: 22 U/L (ref 11–38)
Albumin: 3.8 g/dL (ref 3.3–5.5)
Alkaline Phosphatase: 50 U/L (ref 26–84)
BUN: 13 mg/dL (ref 7–22)
CALCIUM: 9.7 mg/dL (ref 8.0–10.3)
CHLORIDE: 104 meq/L (ref 98–108)
CO2: 31 mEq/L (ref 18–33)
Creat: 0.8 mg/dl (ref 0.6–1.2)
Glucose, Bld: 93 mg/dL (ref 73–118)
POTASSIUM: 4 meq/L (ref 3.3–4.7)
Sodium: 140 mEq/L (ref 128–145)
Total Bilirubin: 0.7 mg/dl (ref 0.20–1.60)
Total Protein: 7.2 g/dL (ref 6.4–8.1)

## 2013-12-29 LAB — CBC WITH DIFFERENTIAL (CANCER CENTER ONLY)
BASO#: 0 10*3/uL (ref 0.0–0.2)
BASO%: 0.3 % (ref 0.0–2.0)
EOS ABS: 0.1 10*3/uL (ref 0.0–0.5)
EOS%: 1 % (ref 0.0–7.0)
HCT: 38.9 % (ref 34.8–46.6)
HGB: 12.8 g/dL (ref 11.6–15.9)
LYMPH#: 1.7 10*3/uL (ref 0.9–3.3)
LYMPH%: 28.1 % (ref 14.0–48.0)
MCH: 31.2 pg (ref 26.0–34.0)
MCHC: 32.9 g/dL (ref 32.0–36.0)
MCV: 95 fL (ref 81–101)
MONO#: 0.5 10*3/uL (ref 0.1–0.9)
MONO%: 8.6 % (ref 0.0–13.0)
NEUT#: 3.7 10*3/uL (ref 1.5–6.5)
NEUT%: 62 % (ref 39.6–80.0)
Platelets: 208 10*3/uL (ref 145–400)
RBC: 4.1 10*6/uL (ref 3.70–5.32)
RDW: 13.1 % (ref 11.1–15.7)
WBC: 6 10*3/uL (ref 3.9–10.0)

## 2013-12-29 LAB — LIPID PANEL
CHOL/HDL RATIO: 2.7 ratio
Cholesterol: 274 mg/dL — ABNORMAL HIGH (ref 0–200)
HDL: 103 mg/dL (ref >39)
LDL CALC: 157 mg/dL — AB (ref 0–99)
Triglycerides: 70 mg/dL (ref <150)
VLDL: 14 mg/dL (ref 0–40)

## 2013-12-29 NOTE — Progress Notes (Signed)
  DIAGNOSIS: Stage IIb (T2N1M0) ductal carcinoma of the right breast-ER negative/HER-2 positive History of DVT of the left subclavian vein    CURRENT THERAPY:  Aspirin 162 mg by mouth daily     INTERIM HISTORY:  Mr. [place] comes back for followup. She is doing okay. She's had no complaints since we last saw her back in December. Her younger brother did have a bypass for cardiac disease. Ms. Deiter is no have a family doctor. She does not know what her cholesterol is. We will have to make a referral for internal medicine so she can start having routine followups. We will check her lipid panel today.    She's had no chest pain. No shortness of breath. She's had no nausea vomiting. There's been no bony pain. She's had no change in bowel or bladder habits.    Am not sure when her last mammogram was. Again we have to try to coordinate everything for her.   PHYSICAL EXAMINATION:  Well developed well-nourished Afro-American female. Vital signs show temperature of 97.8. Pulse 73. Blood pressure 151/87. Weight is 169 pounds. Head and neck exam shows no adenopathy in the neck. No ocular or oral lesions. She has no sinus tenderness to palpation. Lungs are clear. Cardiac exam regular in rhythm with no murmurs rubs or bruits. Abdomen is soft. There is no palpable liver or spleen. Breast exam shows right chest wall status post mastectomy and radiation. No nodules are noted. No axillary adenopathy is noted. Left breast is unremarkable. No left breast masses are noted. There is no left axillary adenopathy. Extremities no clubbing cyanosis or edema. There is no lymphedema in her arms. She has good range of motion of her joints. Skin exam no rashes.   LABORATORY STUDIES:  White cell count is 6. Hemoglobin 13. Hematocrit 39. Platelet count 208. Liver tests are normal.   IMPRESSION:  63 year old post menopausal African American female. She has state to be ductal carcinoma of the right breast. She is  status post adjuvant chemotherapy. She received Herceptin for one year. She received carboplatin and Taxotere. She tolerated this well.    We will go ahead and get a MUGA scan on her. This would be for routine followup.    Again, we will refer to internal medicine for routine health exam is.   Volanda Napoleon, MD 12/29/2013

## 2014-01-05 ENCOUNTER — Telehealth: Payer: Self-pay | Admitting: Nurse Practitioner

## 2014-01-05 NOTE — Telephone Encounter (Addendum)
Message copied by Jimmy Footman on Wed Jan 05, 2014 10:50 AM ------      Message from: Burney Gauze R      Created: Thu Dec 30, 2013  6:39 AM       Call - cholesterol is high - 274 - but good cholesterol (HDL) is excellent!!  Maureen Duncan ------Pt verbalized understanding and appreciation.

## 2014-01-07 ENCOUNTER — Telehealth: Payer: Self-pay | Admitting: *Deleted

## 2014-01-07 DIAGNOSIS — J209 Acute bronchitis, unspecified: Secondary | ICD-10-CM

## 2014-01-07 MED ORDER — AZITHROMYCIN 250 MG PO TABS: ORAL_TABLET | ORAL | Status: DC

## 2014-01-07 NOTE — Telephone Encounter (Signed)
Pt called c/o deep  Cough with yellow sputum.  No fever now.  Zpak called to pharmacy per Dr. Marin Olp and told her to pick up some Delsym cough medication.  Pt voiced understanding.

## 2014-01-10 ENCOUNTER — Encounter: Payer: Self-pay | Admitting: *Deleted

## 2014-01-10 NOTE — Progress Notes (Signed)
Pt called "[facility] over the weekend to report she was having nausea and diarrhea she thinks came from the [place] she started on 01/07/14.  Was instructed by them to take Zofran 4mg  po.  I called the pt to see how she was feeling and she reported she had not ha  any further problems since last night.  Instructed to call us back if she starts to feel bad again.

## 2014-01-24 ENCOUNTER — Encounter: Payer: Self-pay | Admitting: Internal Medicine

## 2014-01-24 ENCOUNTER — Ambulatory Visit (INDEPENDENT_AMBULATORY_CARE_PROVIDER_SITE_OTHER): Payer: Federal, State, Local not specified - PPO | Admitting: Internal Medicine

## 2014-01-24 VITALS — BP 128/84 | HR 55 | Temp 98.2°F | Resp 18 | Ht 65.0 in | Wt 162.0 lb

## 2014-01-24 DIAGNOSIS — J309 Allergic rhinitis, unspecified: Secondary | ICD-10-CM

## 2014-01-24 DIAGNOSIS — Z853 Personal history of malignant neoplasm of breast: Secondary | ICD-10-CM

## 2014-01-24 DIAGNOSIS — E785 Hyperlipidemia, unspecified: Secondary | ICD-10-CM

## 2014-01-24 LAB — TSH: TSH: 0.524 u[IU]/mL (ref 0.350–4.500)

## 2014-01-24 NOTE — Progress Notes (Signed)
Subjective:    Patient ID: Maureen Duncan, female    DOB: Jun 04, 1951, 63 y.o.   MRN: [id number]  HPI  [name] is a new pt here for first visit primary care.   Former primary care at urgent care.  PMH  T2NI breast cancer initially diagnosed 2001 with recurrence in 2013.  She is S/P Right mastectomy and completed chemotherapy last August, hyperlipidemia,  Allergic rhinitis and symptomatic menopause.    See lipids.  She has strong FH of hyperlipidemia.  She just started fish oil in [place]  Strong FH of hyperlipidemia  Allergic rhinitis  On Claritin  And Flonase  Menopause:  Hot flushes much less frequent.    Last mm 08/2013  [facility]    Allergies  Allergen Reactions  . Adhesive [Tape] Rash   Past Medical History  Diagnosis Date  . Breast cancer 2000; 01/30/12    right   Past Surgical History  Procedure Laterality Date  . Cystectomy  1996/1997  . Dental surgery  05/2011 thru 01/30/12    "getting implants"  . Mastectomy    . Modified radical mastectomy w/ axillary lymph node dissection  01/30/12    right  . Insertion central venous access device w/ subcutaneous port  01/30/12    PAC; left  . Breast lumpectomy  2000    right  . Tubal ligation  1979/1980  . Portacath placement  01/30/2012    Procedure: INSERTION PORT-A-CATH;  Surgeon: Shann Medal, MD;  Location: [facility];  Service: General;  Laterality: Left;  . Port-a-cath removal  06/23/2012    Procedure: MINOR REMOVAL PORT-A-CATH;  Surgeon: Shann Medal, MD;  Location: [facility];  Service: General;  Laterality: Left;   History   Social History  . Marital Status: Single    Spouse Name: N/A    Number of Children: N/A  . Years of Education: N/A   Occupational History  . Not on file.   Social History Main Topics  . Smoking status: Former Smoker -- 0.25 packs/day for 2 years    Types: Cigarettes    Start date: 03/31/2010    Quit date: 11/11/2011  . Smokeless tobacco: Never Used     Comment: 01/30/12 "really just  a party smoker; just when my nerves got bad"  . Alcohol Use: 0.6 oz/week    1 Glasses of wine per week  . Drug Use: No  . Sexual Activity: No   Other Topics Concern  . Not on file   Social History Narrative  . No narrative on file   Family History  Problem Relation Age of Onset  . Diabetes Mother   . Hypertension Mother   . Kidney failure Mother   . Kidney disease Mother   . Cancer Mother     pancreatic  . Other Brother     heart transplant  . Cancer Maternal Uncle     lung   Patient Active Problem List   Diagnosis Date Noted  . Breast cancer, right. T2, N1. Mastectomy 01/30/2012. 01/09/2012   Current Outpatient Prescriptions on File Prior to Visit  Medication Sig Dispense Refill  . aspirin EC 81 MG tablet Take 2 tablets (162 mg total) by mouth daily.  500 tablet  4  . Cholecalciferol (VITAMIN D) 2000 UNITS tablet Take 2,000 Units by mouth daily.      . diphenhydrAMINE (BENADRYL) 25 MG tablet Take 25 mg by mouth every 6 (six) hours as needed.      Marland Kitchen  fluticasone (FLONASE) 50 MCG/ACT nasal spray Place 2 sprays into the nose daily.  16 g  2  . loratadine (CLARITIN) 10 MG tablet Take 1 tablet (10 mg total) by mouth daily.  30 tablet  11  . Omega-3 Fatty Acids (FISH OIL) 1000 MG CAPS Take by mouth every morning.      . [DISCONTINUED] calcium-vitamin D (OSCAL) 250-125 MG-UNIT per tablet Take 1 tablet by mouth daily.         Current Facility-Administered Medications on File Prior to Visit  Medication Dose Route Frequency Provider Last Rate Last Dose  . sodium chloride 0.9 % injection 10 mL  10 mL Intravenous PRN Volanda Napoleon, MD   10 mL at 06/17/12 1651     Review of Systems See HPI    Objective:   Physical Exam Physical Exam  Nursing note and vitals reviewed.   Repeat  BP  128/84 Constitutional: She is oriented to person, place, and time. She appears well-developed and well-nourished.  HENT:  Head: Normocephalic and atraumatic.  Cardiovascular: Normal rate and  regular rhythm. Exam reveals no gallop and no friction rub.  No murmur heard.  Pulmonary/Chest: Breath sounds normal. She has no wheezes. She has no rales.  Neurological: She is alert and oriented to person, place, and time.  Skin: Skin is warm and dry.  Psychiatric: She has a normal mood and affect. Her behavior is normal.         Assessment & Plan:  Hyperlipidemia  Continue fish oil  Will recheck fasting levels in 3 months  Allergic rhinitis  Continue claritin and vitamin D  R breast cancer :  Last mm neg    See me in 3 months and schedule CPE

## 2014-01-24 NOTE — Patient Instructions (Signed)
Follow up with me in 3 months  Schedule CPE

## 2014-01-25 ENCOUNTER — Encounter: Payer: Self-pay | Admitting: *Deleted

## 2014-01-25 ENCOUNTER — Telehealth: Payer: Self-pay | Admitting: *Deleted

## 2014-01-25 LAB — VITAMIN D 25 HYDROXY (VIT D DEFICIENCY, FRACTURES): Vit D, 25-Hydroxy: 29 ng/mL — ABNORMAL LOW (ref 30–89)

## 2014-01-25 NOTE — Telephone Encounter (Signed)
Message copied by Conley Rolls on Tue Jan 25, 2014  2:23 PM ------      Message from: Emi Belfast D      Created: Tue Jan 25, 2014  7:59 AM       Jolayne Haines            Call pt and let her know that her vitamin D is minimally low - advise to take 1000 units of vitamin D daily  Ok to mail to her ------

## 2014-01-25 NOTE — Telephone Encounter (Signed)
Notified pt of Vit D results pt is stopping by the office to receive a copy of labs

## 2014-03-10 ENCOUNTER — Ambulatory Visit (HOSPITAL_COMMUNITY)
Admission: RE | Admit: 2014-03-10 | Discharge: 2014-03-10 | Disposition: A | Discharge status: Home / Self Care | Payer: Federal, State, Local not specified - PPO | Source: Ambulatory Visit | Attending: Hematology & Oncology | Admitting: Hematology & Oncology

## 2014-03-10 DIAGNOSIS — E785 Hyperlipidemia, unspecified: Secondary | ICD-10-CM

## 2014-03-10 DIAGNOSIS — Z09 Encounter for follow-up examination after completed treatment for conditions other than malignant neoplasm: Secondary | ICD-10-CM | POA: Insufficient documentation

## 2014-03-10 DIAGNOSIS — Z9010 Acquired absence of unspecified breast and nipple: Secondary | ICD-10-CM | POA: Insufficient documentation

## 2014-03-10 DIAGNOSIS — C50919 Malignant neoplasm of unspecified site of unspecified female breast: Secondary | ICD-10-CM

## 2014-03-10 MED ORDER — TECHNETIUM TC 99M-LABELED RED BLOOD CELLS IV KIT
22.0000 | PACK | Freq: Once | INTRAVENOUS | Status: AC | PRN
  Administered 2014-03-10: 22 via INTRAVENOUS

## 2014-03-11 ENCOUNTER — Telehealth: Payer: Self-pay | Admitting: *Deleted

## 2014-03-11 NOTE — Telephone Encounter (Addendum)
Message copied by Lenn Sink on Fri Mar 11, 2014 10:23 AM ------      Message from: Volanda Napoleon      Created: Fri Mar 11, 2014  6:36 AM       Call - heart is pumping like a champion!!!!  Laurey Arrow ------Informed pt that heart is pumping like a champion!!

## 2014-03-30 ENCOUNTER — Ambulatory Visit (HOSPITAL_BASED_OUTPATIENT_CLINIC_OR_DEPARTMENT_OTHER): Payer: Federal, State, Local not specified - PPO | Admitting: Hematology & Oncology

## 2014-03-30 ENCOUNTER — Other Ambulatory Visit (HOSPITAL_BASED_OUTPATIENT_CLINIC_OR_DEPARTMENT_OTHER): Payer: Federal, State, Local not specified - PPO | Admitting: Lab

## 2014-03-30 VITALS — BP 133/104 | HR 104 | Temp 98.4°F | Resp 14 | Ht 65.0 in | Wt 167.0 lb

## 2014-03-30 DIAGNOSIS — Z853 Personal history of malignant neoplasm of breast: Secondary | ICD-10-CM

## 2014-03-30 DIAGNOSIS — C50919 Malignant neoplasm of unspecified site of unspecified female breast: Secondary | ICD-10-CM

## 2014-03-30 DIAGNOSIS — E559 Vitamin D deficiency, unspecified: Secondary | ICD-10-CM

## 2014-03-30 DIAGNOSIS — E785 Hyperlipidemia, unspecified: Secondary | ICD-10-CM

## 2014-03-30 LAB — CBC WITH DIFFERENTIAL (CANCER CENTER ONLY)
BASO#: 0 10*3/uL (ref 0.0–0.2)
BASO%: 0.4 % (ref 0.0–2.0)
EOS ABS: 0.1 10*3/uL (ref 0.0–0.5)
EOS%: 1.3 % (ref 0.0–7.0)
HCT: 40.4 % (ref 34.8–46.6)
HGB: 13.6 g/dL (ref 11.6–15.9)
LYMPH#: 1.9 10*3/uL (ref 0.9–3.3)
LYMPH%: 34.6 % (ref 14.0–48.0)
MCH: 32 pg (ref 26.0–34.0)
MCHC: 33.7 g/dL (ref 32.0–36.0)
MCV: 95 fL (ref 81–101)
MONO#: 0.4 10*3/uL (ref 0.1–0.9)
MONO%: 7.9 % (ref 0.0–13.0)
NEUT#: 3.1 10*3/uL (ref 1.5–6.5)
NEUT%: 55.8 % (ref 39.6–80.0)
PLATELETS: 241 10*3/uL (ref 145–400)
RBC: 4.25 10*6/uL (ref 3.70–5.32)
RDW: 12.9 % (ref 11.1–15.7)
WBC: 5.6 10*3/uL (ref 3.9–10.0)

## 2014-03-30 LAB — COMPREHENSIVE METABOLIC PANEL
ALT: 12 U/L (ref 0–35)
AST: 20 U/L (ref 0–37)
Albumin: 4.4 g/dL (ref 3.5–5.2)
Alkaline Phosphatase: 50 U/L (ref 39–117)
BILIRUBIN TOTAL: 0.5 mg/dL (ref 0.2–1.2)
BUN: 19 mg/dL (ref 6–23)
CO2: 26 mEq/L (ref 19–32)
Calcium: 10.2 mg/dL (ref 8.4–10.5)
Chloride: 100 mEq/L (ref 96–112)
Creatinine, Ser: 0.82 mg/dL (ref 0.50–1.10)
GLUCOSE: 78 mg/dL (ref 70–99)
Potassium: 3.9 mEq/L (ref 3.5–5.3)
SODIUM: 137 meq/L (ref 135–145)
TOTAL PROTEIN: 7.2 g/dL (ref 6.0–8.3)

## 2014-03-30 LAB — LACTATE DEHYDROGENASE: LDH: 162 U/L (ref 94–250)

## 2014-03-30 NOTE — Progress Notes (Addendum)
  DIAGNOSIS: Stage IIb (T2N1M0) ductal carcinoma of the right breast-ER negative/HER-2 positive History of DVT of the left subclavian vein    CURRENT THERAPY:  Aspirin 162 mg by mouth daily     INTERIM HISTORY:  Mr. Candler comes back for followup. She is doing okay. She's had no complaints since we last saw her back in March. Her younger brother did have a bypass for cardiac disease. Ms. Esqueda will be seeing her new family doctor this month.   We did do a MUGA scan are. This showed an excellent ejection fraction of 55%.  She's had no chest pain. No shortness of breath. She's had no nausea vomiting. There's been no bony pain. She's had no change in bowel or bladder habits.    Am not sure when her last mammogram was. Again we have to try to coordinate everything for her.   PHYSICAL EXAMINATION:  Well developed well-nourished Afro-American female. Vital signs show temperature of 97.8. Pulse 73. Blood pressure 151/87. Weight is 169 pounds. Head and neck exam shows no adenopathy in the neck. No ocular or oral lesions. She has no sinus tenderness to palpation. Lungs are clear. Cardiac exam regular in rhythm with no murmurs rubs or bruits. Abdomen is soft. There is no palpable liver or spleen. Breast exam shows right chest wall status post mastectomy and radiation. No nodules are noted. No axillary adenopathy is noted. Left breast is unremarkable. No left breast masses are noted. There is no left axillary adenopathy. Extremities no clubbing cyanosis or edema. There is no lymphedema in her arms. She has good range of motion of her joints. Skin exam no rashes.   LABORATORY STUDIES:  White cell count is 5.6. Hemoglobin 13.6. Hematocrit 40.4. Platelet count 241.  Liver tests are normal.   IMPRESSION:  63 year old post menopausal African American female. She has state to be ductal carcinoma of the right breast. She is status post adjuvant chemotherapy. She received Herceptin for one year.  She received carboplatin and Taxotere. She tolerated this well.   We will go ahead and get her back in 4 months. She is a done very well. It will be about a year that she completed her Herceptin.  I don't see any evidence that she has recurrent disease.  We must make sure that her vitamin D level is adequate. Volanda Napoleon, MD 03/30/2014

## 2014-04-27 ENCOUNTER — Encounter: Payer: Self-pay | Admitting: Internal Medicine

## 2014-04-27 ENCOUNTER — Ambulatory Visit (INDEPENDENT_AMBULATORY_CARE_PROVIDER_SITE_OTHER): Payer: Federal, State, Local not specified - PPO | Admitting: Internal Medicine

## 2014-04-27 VITALS — BP 140/96 | HR 92 | Resp 16 | Ht 65.0 in | Wt 167.0 lb

## 2014-04-27 DIAGNOSIS — E785 Hyperlipidemia, unspecified: Secondary | ICD-10-CM

## 2014-04-27 DIAGNOSIS — J3089 Other allergic rhinitis: Secondary | ICD-10-CM

## 2014-04-27 DIAGNOSIS — Z1151 Encounter for screening for human papillomavirus (HPV): Secondary | ICD-10-CM | POA: Diagnosis not present

## 2014-04-27 DIAGNOSIS — Z124 Encounter for screening for malignant neoplasm of cervix: Secondary | ICD-10-CM | POA: Diagnosis not present

## 2014-04-27 DIAGNOSIS — Z0000 Encounter for general adult medical examination without abnormal findings: Secondary | ICD-10-CM

## 2014-04-27 LAB — POCT URINALYSIS DIPSTICK
BILIRUBIN UA: NEGATIVE
Glucose, UA: NEGATIVE
KETONES UA: NEGATIVE
Leukocytes, UA: NEGATIVE
Nitrite, UA: NEGATIVE
PH UA: 5
Protein, UA: NEGATIVE
SPEC GRAV UA: 1.015
Urobilinogen, UA: 0.2

## 2014-04-28 DIAGNOSIS — E785 Hyperlipidemia, unspecified: Secondary | ICD-10-CM | POA: Insufficient documentation

## 2014-04-28 DIAGNOSIS — J309 Allergic rhinitis, unspecified: Secondary | ICD-10-CM | POA: Insufficient documentation

## 2014-04-28 NOTE — Progress Notes (Signed)
Subjective:    Patient ID: Maureen Duncan, female    DOB: Nov 28, 1950, 63 y.o.   MRN: [id number]  HPI Maureen Duncan is here for CPE.    Doing well   HM  Mm done 08/2013 [facility],  Colonoscopy 02/2010,  Quit smoking many years ago.    Last pap unclear  See Lipids  She does not wish to take RX meds  She is on fish oil  Allergies  Allergen Reactions  . Adhesive [Tape] Rash   Past Medical History  Diagnosis Date  . Breast cancer 2000; 01/30/12    right   Past Surgical History  Procedure Laterality Date  . Cystectomy  1996/1997  . Dental surgery  05/2011 thru 01/30/12    "getting implants"  . Mastectomy    . Modified radical mastectomy w/ axillary lymph node dissection  01/30/12    right  . Insertion central venous access device w/ subcutaneous port  01/30/12    PAC; left  . Breast lumpectomy  2000    right  . Tubal ligation  1979/1980  . Portacath placement  01/30/2012    Procedure: INSERTION PORT-A-CATH;  Surgeon: Shann Medal, MD;  Location: [place];  Service: General;  Laterality: Left;  . Port-a-cath removal  06/23/2012    Procedure: MINOR REMOVAL PORT-A-CATH;  Surgeon: Shann Medal, MD;  Location: [place];  Service: General;  Laterality: Left;   History   Social History  . Marital Status: Single    Spouse Name: N/A    Number of Children: N/A  . Years of Education: N/A   Occupational History  . Not on file.   Social History Main Topics  . Smoking status: Former Smoker -- 0.25 packs/day for 2 years    Types: Cigarettes    Start date: 03/31/2010    Quit date: 11/11/2011  . Smokeless tobacco: Never Used     Comment: 01/30/12 "really just a party smoker; just when my nerves got bad"  . Alcohol Use: 0.6 oz/week    1 Glasses of wine per week  . Drug Use: No  . Sexual Activity: No   Other Topics Concern  . Not on file   Social History Narrative  . No narrative on file   Family History  Problem Relation Age of Onset  . Diabetes Mother   . Hypertension Mother    . Kidney failure Mother   . Kidney disease Mother   . Cancer Mother     pancreatic  . Other Brother     heart transplant  . Cancer Maternal Uncle     lung   Patient Active Problem List   Diagnosis Date Noted  . Hyperlipidemia 04/28/2014  . Allergic rhinitis 04/28/2014  . Breast cancer, right. T2, N1. Mastectomy 01/30/2012. 01/09/2012   Current Outpatient Prescriptions on File Prior to Visit  Medication Sig Dispense Refill  . aspirin EC 81 MG tablet Take 2 tablets (162 mg total) by mouth daily.  500 tablet  4  . Cholecalciferol (VITAMIN D) 2000 UNITS tablet Take 2,000 Units by mouth daily.      . diphenhydrAMINE (BENADRYL) 25 MG tablet Take 25 mg by mouth every 6 (six) hours as needed.      . fluticasone (FLONASE) 50 MCG/ACT nasal spray Place 2 sprays into the nose daily.  16 g  2  . loratadine (CLARITIN) 10 MG tablet Take 1 tablet (10 mg total) by mouth daily.  30 tablet  11  . Omega-3 Fatty  Acids (FISH OIL) 1000 MG CAPS Take by mouth every morning.      . [DISCONTINUED] calcium-vitamin D (OSCAL) 250-125 MG-UNIT per tablet Take 1 tablet by mouth daily.         Current Facility-Administered Medications on File Prior to Visit  Medication Dose Route Frequency Provider Last Rate Last Dose  . sodium chloride 0.9 % injection 10 mL  10 mL Intravenous PRN Volanda Napoleon, MD   10 mL at 06/17/12 1651       Review of Systems  Respiratory: Negative for cough, chest tightness and shortness of breath.   Cardiovascular: Negative for chest pain, palpitations and leg swelling.  Gastrointestinal: Negative for abdominal pain.  All other systems reviewed and are negative.      Objective:   Physical Exam Physical Exam  Vital signs and nursing note reviewed  Constitutional: She is oriented to person, place, and time. She appears well-developed and well-nourished. She is cooperative.  HENT:  Head: Normocephalic and atraumatic.  Right Ear: Tympanic membrane normal.  Left Ear: Tympanic  membrane normal.  Nose: Nose normal.  Mouth/Throat: Oropharynx is clear and moist and mucous membranes are normal. No oropharyngeal exudate or posterior oropharyngeal erythema.  Eyes: Conjunctivae and EOM are normal. Pupils are equal, round, and reactive to light.  Neck: Neck supple. No JVD present. Carotid bruit is not present. No mass and no thyromegaly present.  Cardiovascular: Regular rhythm, normal heart sounds, intact distal pulses and normal pulses.  Exam reveals no gallop and no friction rub.   No murmur heard. Pulses:      Dorsalis pedis pulses are 2+ on the right side, and 2+ on the left side.  Pulmonary/Chest: Breath sounds normal. She has no wheezes. She has no rhonchi. She has no rales. Right breast exhibits no mass, no nipple discharge and no skin change. Left breast well healed mastectomy scar.  Abdominal: Soft. Bowel sounds are normal. She exhibits no distension and no mass. There is no hepatosplenomegaly. There is no tenderness. There is no CVA tenderness.  Genitourinary: Rectum normal, vagina normal and uterus normal. Rectal exam shows no mass. Guaiac negative stool. No labial fusion. There is no lesion on the right labia. There is no lesion on the left labia. Cervix exhibits no motion tenderness. Right adnexum displays no mass, no tenderness and no fullness. Left adnexum displays no mass, no tenderness and no fullness. No erythema around the vagina.  Musculoskeletal:       No active synovitis to any joint.    Lymphadenopathy:       Right cervical: No superficial cervical adenopathy present.      Left cervical: No superficial cervical adenopathy present.       Right axillary: No pectoral and no lateral adenopathy present.       Left axillary: No pectoral and no lateral adenopathy present.      Right: No inguinal adenopathy present.       Left: No inguinal adenopathy present.  Neurological: She is alert and oriented to person, place, and time. She has normal strength and normal  reflexes. No cranial nerve deficit or sensory deficit. She displays a negative Romberg sign. Coordination and gait normal.  Skin: Skin is warm and dry. No abrasion, no bruising, no ecchymosis and no rash noted. No cyanosis. Nails show no clubbing.  Psychiatric: She has a normal mood and affect. Her speech is normal and behavior is normal.          Assessment & Plan:  HM  Pap today  Mm due in November    Labs today  Breast cancer has completed CTX  Hyperlipidemia  Will check today.  Continue fish oil  If not better will discuss options with pt   Minimal trace heme on U/A  Will send for culture pt asymptomatic        Assessment & Plan:

## 2014-04-29 LAB — CULTURE, URINE COMPREHENSIVE
Colony Count: NO GROWTH
Organism ID, Bacteria: NO GROWTH

## 2014-05-02 LAB — CYTOLOGY - PAP

## 2014-05-04 ENCOUNTER — Encounter: Payer: Self-pay | Admitting: *Deleted

## 2014-05-30 ENCOUNTER — Telehealth: Payer: Self-pay | Admitting: *Deleted

## 2014-05-30 ENCOUNTER — Ambulatory Visit (HOSPITAL_BASED_OUTPATIENT_CLINIC_OR_DEPARTMENT_OTHER): Payer: Federal, State, Local not specified - PPO | Admitting: Family

## 2014-05-30 ENCOUNTER — Encounter: Payer: Self-pay | Admitting: Family

## 2014-05-30 VITALS — BP 112/77 | HR 85 | Temp 98.2°F | Resp 14 | Ht 65.0 in | Wt 165.0 lb

## 2014-05-30 DIAGNOSIS — C50911 Malignant neoplasm of unspecified site of right female breast: Secondary | ICD-10-CM

## 2014-05-30 DIAGNOSIS — I890 Lymphedema, not elsewhere classified: Secondary | ICD-10-CM

## 2014-05-30 NOTE — Progress Notes (Addendum)
[place]  [phone] [phone]  ID: Maureen Duncan OB: [birth year] MR#: [id number]#:[id number] Patient Care Team: Lanice Shirts, MD as PCP - General (Internal Medicine) Volanda Napoleon, MD as Consulting Physician (Hematology and Oncology)  DIAGNOSIS: Stage IIb (T2N1M0) ductal carcinoma of the right breast-ER negative/HER-2 positive  History of DVT of the left subclavian vein  INTERVAL HISTORY: Maureen Duncan is here today after calling in with arm pain. She states that she has had right arm pain and swelling for the last week or so. She states that she has been doing some running in place and has played wii bowling with her grandson. She had not been wearing her sleeve. Her radial pulses are +2. She is right handed. She denies having nay weakness in that arm only pain. She denies any lymphadenopathy. She denies fever, chills, rash, headaches, SOB, chest pain, palpitations, abdominal pain, constipation, diarrhea, blood in urine or stool. She denies any swelling, tenderness, numbness or tingling in her other extremities. She denies any changes in her right chest or left breast. Her appetite is good. She has plenty of energy.   CURRENT TREATMENT: Aspirin 162 mg by mouth daily  REVIEW OF SYSTEMS: All other 10 point review of systems is negative except for those issues mentioned above.   PAST MEDICAL HISTORY: Past Medical History  Diagnosis Date  . Breast cancer 2000; 01/30/12    right   PAST SURGICAL HISTORY: Past Surgical History  Procedure Laterality Date  . Cystectomy  1996/1997  . Dental surgery  05/2011 thru 01/30/12    "getting implants"  . Mastectomy    . Modified radical mastectomy w/ axillary lymph node dissection  01/30/12    right  . Insertion central venous access device w/ subcutaneous port  01/30/12    PAC; left  . Breast lumpectomy  2000    right  . Tubal ligation  1979/1980  . Portacath placement  01/30/2012    Procedure: INSERTION  PORT-A-CATH;  Surgeon: Shann Medal, MD;  Location: [facility];  Service: General;  Laterality: Left;  . Port-a-cath removal  06/23/2012    Procedure: MINOR REMOVAL PORT-A-CATH;  Surgeon: Shann Medal, MD;  Location: [place];  Service: General;  Laterality: Left;   FAMILY HISTORY Family History  Problem Relation Age of Onset  . Diabetes Mother   . Hypertension Mother   . Kidney failure Mother   . Kidney disease Mother   . Cancer Mother     pancreatic  . Other Brother     heart transplant  . Cancer Maternal Uncle     lung   GYNECOLOGIC HISTORY:  No LMP recorded. Patient is postmenopausal.   SOCIAL HISTORY:  History   Social History  . Marital Status: Single    Spouse Name: N/A    Number of Children: N/A  . Years of Education: N/A   Occupational History  . Not on file.   Social History Main Topics  . Smoking status: Former Smoker -- 0.25 packs/day for 2 years    Types: Cigarettes    Start date: 03/31/2010    Quit date: 11/11/2011  . Smokeless tobacco: Never Used     Comment: quit 2 years ago  . Alcohol Use: 0.6 oz/week    1 Glasses of wine per week  . Drug Use: No  . Sexual Activity: No   Other Topics Concern  . Not on file   Social History Narrative  . No narrative  on file   ADVANCED DIRECTIVES: <no information>  HEALTH MAINTENANCE: History  Substance Use Topics  . Smoking status: Former Smoker -- 0.25 packs/day for 2 years    Types: Cigarettes    Start date: 03/31/2010    Quit date: 11/11/2011  . Smokeless tobacco: Never Used     Comment: quit 2 years ago  . Alcohol Use: 0.6 oz/week    1 Glasses of wine per week   Colonoscopy: PAP: Bone density: Lipid panel:  Allergies  Allergen Reactions  . Adhesive [Tape] Rash    Current Outpatient Prescriptions  Medication Sig Dispense Refill  . aspirin 81 MG tablet Take 81 mg by mouth daily.      . Cholecalciferol (VITAMIN D) 2000 UNITS tablet Take 2,000 Units by mouth daily.      .  diphenhydrAMINE (BENADRYL) 25 MG tablet Take 25 mg by mouth every 6 (six) hours as needed.      . fluticasone (FLONASE) 50 MCG/ACT nasal spray Place 2 sprays into the nose as needed.      . loratadine (CLARITIN) 10 MG tablet Take 1 tablet (10 mg total) by mouth daily.  30 tablet  11  . Melatonin 3 MG TABS Take 1 tablet by mouth daily.      . Multiple Vitamin (MULTIVITAMIN) capsule Take 1 capsule by mouth daily.      . Nutritional Supplements (EQUATE PO) Take by mouth as needed. As needed for pain      . Omega-3 Fatty Acids (FISH OIL) 1000 MG CAPS Take by mouth every morning.      . [DISCONTINUED] calcium-vitamin D (OSCAL) 250-125 MG-UNIT per tablet Take 1 tablet by mouth daily.         No current facility-administered medications for this visit.   Facility-Administered Medications Ordered in Other Visits  Medication Dose Route Frequency Provider Last Rate Last Dose  . sodium chloride 0.9 % injection 10 mL  10 mL Intravenous PRN Volanda Napoleon, MD   10 mL at 06/17/12 1651   OBJECTIVE: Filed Vitals:   05/30/14 1533  BP: 112/77  Pulse: 85  Temp: 98.2 F (36.8 C)  Resp: 14   Body mass index is 27.46 kg/(m^2). ECOG FS:0 - Asymptomatic Ocular: Sclerae unicteric, pupils equal, round and reactive to light Ear-nose-throat: Oropharynx clear, dentition fair Lymphatic: No cervical or supraclavicular adenopathy Lungs no rales or rhonchi, good excursion bilaterally Heart regular rate and rhythm, no murmur appreciated Abd soft, nontender, positive bowel sounds MSK no focal spinal tenderness, no joint edema Neuro: non-focal, well-oriented, appropriate affect Breasts: no lymphedema, her right mastectomy scar was intact, no lumps or bumps noted, no rash  LAB RESULTS: CMP     Component Value Date/Time   NA 137 03/30/2014 1146   NA 140 12/29/2013 0845   K 3.9 03/30/2014 1146   K 4.0 12/29/2013 0845   CL 100 03/30/2014 1146   CL 104 12/29/2013 0845   CO2 26 03/30/2014 1146   CO2 31 12/29/2013 0845    GLUCOSE 78 03/30/2014 1146   GLUCOSE 93 12/29/2013 0845   BUN 19 03/30/2014 1146   BUN 13 12/29/2013 0845   CREATININE 0.82 03/30/2014 1146   CREATININE 0.8 12/29/2013 0845   CALCIUM 10.2 03/30/2014 1146   CALCIUM 9.7 12/29/2013 0845   PROT 7.2 03/30/2014 1146   PROT 7.2 12/29/2013 0845   ALBUMIN 4.4 03/30/2014 1146   AST 20 03/30/2014 1146   AST 22 12/29/2013 0845   ALT 12 03/30/2014 1146  ALT 13 12/29/2013 0845   ALKPHOS 50 03/30/2014 1146   ALKPHOS 50 12/29/2013 0845   BILITOT 0.5 03/30/2014 1146   BILITOT 0.70 12/29/2013 0845   GFRNONAA >90 07/14/2012 1011   GFRAA >90 07/14/2012 1011   No results found for this basename: SPEP, UPEP,  kappa and lambda light chains   Lab Results  Component Value Date   WBC 5.6 03/30/2014   NEUTROABS 3.1 03/30/2014   HGB 13.6 03/30/2014   HCT 40.4 03/30/2014   MCV 95 03/30/2014   PLT 241 03/30/2014   No results found for this basename: LABCA2   No components found with this basename: [id number]   No results found for this basename: INR,  in the last 168 hours  STUDIES: No results found.  ASSESSMENT/PLAN: 63 year old post menopausal African American female with history of ductal carcinoma of the right breast. She is status post adjuvant chemotherapy. She received Herceptin for one year. She received carboplatin and Taxotere. She tolerated this well.  I see no evidence of recurrent disease. The tenderness and swelling are most likely due to over use of the right arm and not wearing her compression sleeve and glove. She states that these have already improved tremendously with the use of her sleeve. She has full range of motion of her arm.   We will see her back in October for labs and her scheduled follow-up.  All questions were answered and she knows to call here with any questions. We can certainly see her sooner if need be.  Eliezer Bottom, NP 05/30/2014 5:10 PM  ADDENDUM:  I saw and examined Ms. Wilber. She has chronic mild lymphedema of the right arm. She does have a lymphedema  sleeve. She may have overworked the arm recently. She does have some tenderness about the right elbow. She may have some tendinitis.  I don't think that there is any thromboembolic event. Her arm is minimally swollen but is at baseline. She has good pulses. Good range of motion. Again, there is some tenderness about the lateral elbow. There's no lymph nodes in the right axilla.  She or the has had physical therapy for the arm.  I think we can just watch this for right now. She is on 162 mg of aspirin a day. She will wear that lymphedema sleeve more often. She will try to refrain from using the left arm for her heavy lifting.  Lum Keas

## 2014-05-30 NOTE — Telephone Encounter (Signed)
Pt called stating she was concerned that her right arm has been causing her pain this weekend. Pt states that pain starts at her right wrist and goes up to her armpit. Informed MD. Dr Marin Olp wants pt to come in for an office visit today. Informed pt to come in for office visit at 3:15. Scheduler and LPN aware.

## 2014-06-09 MED ORDER — PREDNISONE 20 MG PO TABS: ORAL_TABLET | ORAL | Status: DC

## 2014-06-15 ENCOUNTER — Encounter: Payer: Self-pay | Admitting: Internal Medicine

## 2014-06-15 ENCOUNTER — Ambulatory Visit (INDEPENDENT_AMBULATORY_CARE_PROVIDER_SITE_OTHER): Payer: Federal, State, Local not specified - PPO | Admitting: Internal Medicine

## 2014-06-15 VITALS — BP 135/83 | HR 86 | Temp 98.1°F | Resp 17 | Ht 65.0 in | Wt 171.0 lb

## 2014-06-15 DIAGNOSIS — M6580 Other synovitis and tenosynovitis, unspecified site: Secondary | ICD-10-CM

## 2014-06-15 DIAGNOSIS — M25529 Pain in unspecified elbow: Secondary | ICD-10-CM | POA: Diagnosis not present

## 2014-06-15 DIAGNOSIS — M778 Other enthesopathies, not elsewhere classified: Secondary | ICD-10-CM

## 2014-06-15 DIAGNOSIS — I890 Lymphedema, not elsewhere classified: Secondary | ICD-10-CM | POA: Diagnosis not present

## 2014-06-15 DIAGNOSIS — M25521 Pain in right elbow: Secondary | ICD-10-CM

## 2014-06-15 MED ORDER — IBUPROFEN 800 MG PO TABS: ORAL_TABLET | ORAL | Status: DC

## 2014-06-15 NOTE — Progress Notes (Signed)
   Subjective:    Patient ID: Maureen Duncan, female    DOB: 1951-07-17, 63 y.o.   MRN: [id number]  HPI  Maureen Duncan is here for acute visit.    She has recently been evaluated by oncology for R arm pain .  Onset 4 weeks ago describes and primarily from elbow downward .   No symptoms above elbow, no shoulder pain   No injury or trauma  Some lymphedema but it wearing sleeve now and that has improved  Hurts when turning key or pronating wrist   Review of Systems See HPI    Objective:   Physical Exam Physical Exam  Nursing note and vitals reviewed.  Constitutional: She is oriented to person, place, and time. She appears well-developed and well-nourished.  HENT:  Head: Normocephalic and atraumatic.  Cardiovascular: Normal rate and regular rhythm. Exam reveals no gallop and no friction rub.  No murmur heard.  Pulmonary/Chest: Breath sounds normal. She has no wheezes. She has no rales.  Neurological: She is alert and oriented to person, place, and time.  Skin: Skin is warm and dry.  MS:  N-V intact  Good pedal pulse  She does have mild lymphedema  Sleeve in place  Pain with pronation in elbow.  No active synovitis.  Good ROM shoulder no pain  Pain upon plapation lateral epicondyle Psychiatric: She has a normal mood and affect. Her behavior is normal.              Assessment & Plan:  Tendinitis vs epicondylitis  Ok for Ibuprofen 800 mg bid for 10 days.  If no imporvement pt to see Dr. Barbaraann Barthel  Lymphedema  Continue wearing sleeve.  She doe have history f  LUE thrombus when PAC in place.  Clinical suspicion very low for thrombus     Follow up with me in 3-4 weeks  Sooner prn

## 2014-06-15 NOTE — Patient Instructions (Signed)
Give pt number to Dr. Barbaraann Barthel -- pt to make appt today at his office    See me 3-4 weeks  To pharmacy

## 2014-07-13 ENCOUNTER — Encounter: Payer: Self-pay | Admitting: Internal Medicine

## 2014-07-13 ENCOUNTER — Ambulatory Visit (INDEPENDENT_AMBULATORY_CARE_PROVIDER_SITE_OTHER): Payer: Federal, State, Local not specified - PPO | Admitting: Internal Medicine

## 2014-07-13 VITALS — BP 144/99 | HR 78 | Temp 98.1°F | Resp 16 | Ht 65.0 in | Wt 170.0 lb

## 2014-07-13 DIAGNOSIS — M6580 Other synovitis and tenosynovitis, unspecified site: Secondary | ICD-10-CM | POA: Diagnosis not present

## 2014-07-13 DIAGNOSIS — M778 Other enthesopathies, not elsewhere classified: Secondary | ICD-10-CM

## 2014-07-13 NOTE — Progress Notes (Signed)
Subjective:    Patient ID: Maureen Duncan, female    DOB: 11-Apr-1951, 63 y.o.   MRN: [id number]  HPI  Last visit  Tendinitis vs epicondylitis Ok for Ibuprofen 800 mg bid for 10 days. If no imporvement pt to see Dr. Barbaraann Barthel  Lymphedema Continue wearing sleeve. She doe have history f LUE thrombus when PAC in place. Clinical suspicion very low for thrombus  Follow up with me in 3-4 weeks Sooner prn   Today's visit  R elbow improved but still hurst when using lawn mower .  Less edema in affected arm   Pt still wishes to see Dr. Yehuda Budd   She is off NSAIDS Now    Allergies  Allergen Reactions  . Adhesive [Tape] Rash   Past Medical History  Diagnosis Date  . Breast cancer 2000; 01/30/12    right   Past Surgical History  Procedure Laterality Date  . Cystectomy  1996/1997  . Dental surgery  05/2011 thru 01/30/12    "getting implants"  . Mastectomy    . Modified radical mastectomy w/ axillary lymph node dissection  01/30/12    right  . Insertion central venous access device w/ subcutaneous port  01/30/12    PAC; left  . Breast lumpectomy  2000    right  . Tubal ligation  1979/1980  . Portacath placement  01/30/2012    Procedure: INSERTION PORT-A-CATH;  Surgeon: Shann Medal, MD;  Location: [facility];  Service: General;  Laterality: Left;  . Port-a-cath removal  06/23/2012    Procedure: MINOR REMOVAL PORT-A-CATH;  Surgeon: Shann Medal, MD;  Location: [place] [facility];  Service: General;  Laterality: Left;   History   Social History  . Marital Status: Single    Spouse Name: N/A    Number of Children: N/A  . Years of Education: N/A   Occupational History  . Not on file.   Social History Main Topics  . Smoking status: Former Smoker -- 0.25 packs/day for 2 years    Types: Cigarettes    Start date: 03/31/2010    Quit date: 11/11/2011  . Smokeless tobacco: Never Used     Comment: quit 2 years ago  . Alcohol Use: 0.6 oz/week    1 Glasses of wine per week  . Drug Use:  No  . Sexual Activity: No   Other Topics Concern  . Not on file   Social History Narrative  . No narrative on file   Family History  Problem Relation Age of Onset  . Diabetes Mother   . Hypertension Mother   . Kidney failure Mother   . Kidney disease Mother   . Cancer Mother     pancreatic  . Other Brother     heart transplant  . Cancer Maternal Uncle     lung   Patient Active Problem List   Diagnosis Date Noted  . Hyperlipidemia 04/28/2014  . Allergic rhinitis 04/28/2014  . Breast cancer, right. T2, N1. Mastectomy 01/30/2012. 01/09/2012   Current Outpatient Prescriptions on File Prior to Visit  Medication Sig Dispense Refill  . aspirin 81 MG tablet Take 81 mg by mouth daily.      . Cholecalciferol (VITAMIN D) 2000 UNITS tablet Take 2,000 Units by mouth daily.      . diphenhydrAMINE (BENADRYL) 25 MG tablet Take 25 mg by mouth every 6 (six) hours as needed.      . fluticasone (FLONASE) 50 MCG/ACT nasal spray Place 2 sprays into the  nose as needed.      Marland Kitchen ibuprofen (ADVIL,MOTRIN) 800 MG tablet Take one bid with food  20 tablet  0  . loratadine (CLARITIN) 10 MG tablet Take 1 tablet (10 mg total) by mouth daily.  30 tablet  11  . Melatonin 3 MG TABS Take 1 tablet by mouth daily.      . Multiple Vitamin (MULTIVITAMIN) capsule Take 1 capsule by mouth daily.      . Nutritional Supplements (EQUATE PO) Take by mouth as needed. As needed for pain      . Omega-3 Fatty Acids (FISH OIL) 1000 MG CAPS Take by mouth every morning.      . [DISCONTINUED] calcium-vitamin D (OSCAL) 250-125 MG-UNIT per tablet Take 1 tablet by mouth daily.         Current Facility-Administered Medications on File Prior to Visit  Medication Dose Route Frequency Provider Last Rate Last Dose  . sodium chloride 0.9 % injection 10 mL  10 mL Intravenous PRN Volanda Napoleon, MD   10 mL at 06/17/12 1651     Review of Systems    see HPI Objective:   Physical Exam Physical Exam  Nursing note and vitals reviewed.    Constitutional: She is oriented to person, place, and time. She appears well-developed and well-nourished.  HENT:  Head: Normocephalic and atraumatic.  Cardiovascular: Normal rate and regular rhythm. Exam reveals no gallop and no friction rub.  No murmur heard.  Pulmonary/Chest: Breath sounds normal. She has no wheezes. She has no rales.  Neurological: She is alert and oriented to person, place, and time.  Skin: Skin is warm and dry.  M/S  No tenderness to elbow manuevers in office   Psychiatric: She has a normal mood and affect. Her behavior is normal.              Assessment & Plan:  R elbow tendinitis vs epicondylitis   Pt still wishes to see Dr. Charolotte Capuchin with referral   Lymphedema r arm  Sleeve as needed  Flu vaccine pt declined today

## 2014-07-15 ENCOUNTER — Ambulatory Visit: Payer: Federal, State, Local not specified - PPO | Admitting: Family Medicine

## 2014-07-25 ENCOUNTER — Ambulatory Visit (INDEPENDENT_AMBULATORY_CARE_PROVIDER_SITE_OTHER): Payer: Federal, State, Local not specified - PPO | Admitting: Family Medicine

## 2014-07-25 ENCOUNTER — Ambulatory Visit: Payer: Federal, State, Local not specified - PPO | Admitting: Family Medicine

## 2014-07-25 ENCOUNTER — Encounter: Payer: Self-pay | Admitting: Family Medicine

## 2014-07-25 ENCOUNTER — Encounter (INDEPENDENT_AMBULATORY_CARE_PROVIDER_SITE_OTHER): Payer: Self-pay

## 2014-07-25 VITALS — BP 133/93 | HR 84 | Ht 65.0 in | Wt 167.0 lb

## 2014-07-25 DIAGNOSIS — M25521 Pain in right elbow: Secondary | ICD-10-CM

## 2014-07-25 DIAGNOSIS — M25529 Pain in unspecified elbow: Secondary | ICD-10-CM

## 2014-07-25 NOTE — Assessment & Plan Note (Signed)
2/2 forearm strain with an element of lateral epicondylitis.  Reviewed home exercise program to do daily.  Icing, nsaids.  Sleeve or counterforce brace.  Consider formal physical therapy, injection, nitro patches if not improving.  Follow up with me in 6 weeks.

## 2014-07-25 NOTE — Patient Instructions (Signed)
You have a forearm strain with an element of lateral epicondylitis Try to avoid painful activities as much as possible. Ice or heat to the area 3-4 times a day for 15 minutes at a time. Ibuprofen or aleve as needed for pain. Sleeve or counterforce brace as directed can help unload area - wear this regularly if it provides you with relief. Home Pronation/supination with hammer, wrist extension with 1 pound weight or soup can 3 sets of 10 once a day each of these.  Stretching - hold 20-30 seconds, repeat 3 times. Consider formal physical therapy, injection, nitro patches if not improving. Follow up with me in 6 weeks.

## 2014-07-25 NOTE — Progress Notes (Signed)
Patient ID: Maureen Duncan, female   DOB: Oct 20, 1951, 63 y.o.   MRN: [id number]  PCP and referred by: Kelton Pillar, MD  Subjective:   HPI: Patient is a 63 y.o. female here for right elbow pain.  Patient reports about 2 months ago she started to get lateral right elbow pain. No known injury or trauma. Is right handed. Taking ibuprofen 800mg  three times a day and using a sleeve from [employer]. Difficulty picking up items. No prior issues with this elbow.  Past Medical History  Diagnosis Date  . Breast cancer 2000; 01/30/12    right    Current Outpatient Prescriptions on File Prior to Visit  Medication Sig Dispense Refill  . aspirin 81 MG tablet Take 81 mg by mouth daily.      . Cholecalciferol (VITAMIN D) 2000 UNITS tablet Take 2,000 Units by mouth daily.      . diphenhydrAMINE (BENADRYL) 25 MG tablet Take 25 mg by mouth every 6 (six) hours as needed.      . fluticasone (FLONASE) 50 MCG/ACT nasal spray Place 2 sprays into the nose as needed.      Marland Kitchen ibuprofen (ADVIL,MOTRIN) 800 MG tablet Take one bid with food  20 tablet  0  . loratadine (CLARITIN) 10 MG tablet Take 1 tablet (10 mg total) by mouth daily.  30 tablet  11  . Melatonin 3 MG TABS Take 1 tablet by mouth daily.      . Multiple Vitamin (MULTIVITAMIN) capsule Take 1 capsule by mouth daily.      . Nutritional Supplements (EQUATE PO) Take by mouth as needed. As needed for pain      . Omega-3 Fatty Acids (FISH OIL) 1000 MG CAPS Take by mouth every morning.      . [DISCONTINUED] calcium-vitamin D (OSCAL) 250-125 MG-UNIT per tablet Take 1 tablet by mouth daily.         Current Facility-Administered Medications on File Prior to Visit  Medication Dose Route Frequency Provider Last Rate Last Dose  . sodium chloride 0.9 % injection 10 mL  10 mL Intravenous PRN Volanda Napoleon, MD   10 mL at 06/17/12 1651    Past Surgical History  Procedure Laterality Date  . Cystectomy  1996/1997  . Dental surgery  05/2011 thru 01/30/12    "getting  implants"  . Mastectomy    . Modified radical mastectomy w/ axillary lymph node dissection  01/30/12    right  . Insertion central venous access device w/ subcutaneous port  01/30/12    PAC; left  . Breast lumpectomy  2000    right  . Tubal ligation  1979/1980  . Portacath placement  01/30/2012    Procedure: INSERTION PORT-A-CATH;  Surgeon: Shann Medal, MD;  Location: [facility];  Service: General;  Laterality: Left;  . Port-a-cath removal  06/23/2012    Procedure: MINOR REMOVAL PORT-A-CATH;  Surgeon: Shann Medal, MD;  Location: [facility];  Service: General;  Laterality: Left;    Allergies  Allergen Reactions  . Adhesive [Tape] Rash    History   Social History  . Marital Status: Single    Spouse Name: N/A    Number of Children: N/A  . Years of Education: N/A   Occupational History  . Not on file.   Social History Main Topics  . Smoking status: Former Smoker -- 0.25 packs/day for 2 years    Types: Cigarettes    Start date: 03/31/2010    Quit date: 11/11/2011  .  Smokeless tobacco: Never Used     Comment: quit 2 years ago  . Alcohol Use: 0.6 oz/week    1 Glasses of wine per week  . Drug Use: No  . Sexual Activity: No   Other Topics Concern  . Not on file   Social History Narrative  . No narrative on file    Family History  Problem Relation Age of Onset  . Diabetes Mother   . Hypertension Mother   . Kidney failure Mother   . Kidney disease Mother   . Cancer Mother     pancreatic  . Other Brother     heart transplant  . Cancer Maternal Uncle     lung    BP 133/93  Pulse 84  Ht 5\' 5"  (1.651 m)  Wt 167 lb (75.751 kg)  BMI 27.79 kg/m2  Review of Systems: See HPI above.    Objective:  Physical Exam:  Gen: NAD  Right elbow; No gross deformity, swelling, bruising. TTP lateral forearm, lateral epicondyle.  No other tenderness. Pain reproduced with pronation/supination.  None with wrist extension, 3rd digit extension. FROM. Collateral  ligaments intact. NVI distally.    Assessment & Plan:  1. Right elbow pain - 2/2 forearm strain with an element of lateral epicondylitis.  Reviewed home exercise program to do daily.  Icing, nsaids.  Sleeve or counterforce brace.  Consider formal physical therapy, injection, nitro patches if not improving.  Follow up with me in 6 weeks.

## 2014-08-03 ENCOUNTER — Other Ambulatory Visit: Payer: Self-pay | Admitting: *Deleted

## 2014-08-03 ENCOUNTER — Encounter: Payer: Self-pay | Admitting: Hematology & Oncology

## 2014-08-03 ENCOUNTER — Ambulatory Visit (HOSPITAL_BASED_OUTPATIENT_CLINIC_OR_DEPARTMENT_OTHER): Payer: Federal, State, Local not specified - PPO | Admitting: Hematology & Oncology

## 2014-08-03 ENCOUNTER — Other Ambulatory Visit (HOSPITAL_BASED_OUTPATIENT_CLINIC_OR_DEPARTMENT_OTHER): Payer: Federal, State, Local not specified - PPO | Admitting: Lab

## 2014-08-03 VITALS — BP 120/86 | HR 87 | Temp 97.7°F | Resp 14 | Ht 65.0 in | Wt 170.0 lb

## 2014-08-03 DIAGNOSIS — Z853 Personal history of malignant neoplasm of breast: Secondary | ICD-10-CM

## 2014-08-03 DIAGNOSIS — M779 Enthesopathy, unspecified: Secondary | ICD-10-CM

## 2014-08-03 DIAGNOSIS — C50919 Malignant neoplasm of unspecified site of unspecified female breast: Secondary | ICD-10-CM

## 2014-08-03 DIAGNOSIS — C50911 Malignant neoplasm of unspecified site of right female breast: Secondary | ICD-10-CM

## 2014-08-03 DIAGNOSIS — E559 Vitamin D deficiency, unspecified: Secondary | ICD-10-CM

## 2014-08-03 LAB — CBC WITH DIFFERENTIAL (CANCER CENTER ONLY)
BASO#: 0 10*3/uL (ref 0.0–0.2)
BASO%: 0.2 % (ref 0.0–2.0)
EOS ABS: 0.1 10*3/uL (ref 0.0–0.5)
EOS%: 1.3 % (ref 0.0–7.0)
HEMATOCRIT: 40.4 % (ref 34.8–46.6)
HEMOGLOBIN: 13.3 g/dL (ref 11.6–15.9)
LYMPH#: 1.6 10*3/uL (ref 0.9–3.3)
LYMPH%: 29.5 % (ref 14.0–48.0)
MCH: 31.3 pg (ref 26.0–34.0)
MCHC: 32.9 g/dL (ref 32.0–36.0)
MCV: 95 fL (ref 81–101)
MONO#: 0.5 10*3/uL (ref 0.1–0.9)
MONO%: 8.7 % (ref 0.0–13.0)
NEUT#: 3.3 10*3/uL (ref 1.5–6.5)
NEUT%: 60.3 % (ref 39.6–80.0)
Platelets: 256 10*3/uL (ref 145–400)
RBC: 4.25 10*6/uL (ref 3.70–5.32)
RDW: 12.9 % (ref 11.1–15.7)
WBC: 5.5 10*3/uL (ref 3.9–10.0)

## 2014-08-03 LAB — CMP (CANCER CENTER ONLY)
ALBUMIN: 3.8 g/dL (ref 3.3–5.5)
ALT(SGPT): 15 U/L (ref 10–47)
AST: 22 U/L (ref 11–38)
Alkaline Phosphatase: 49 U/L (ref 26–84)
BUN: 14 mg/dL (ref 7–22)
CHLORIDE: 97 meq/L — AB (ref 98–108)
CO2: 29 mEq/L (ref 18–33)
Calcium: 10.2 mg/dL (ref 8.0–10.3)
Creat: 0.8 mg/dl (ref 0.6–1.2)
Glucose, Bld: 110 mg/dL (ref 73–118)
POTASSIUM: 3.6 meq/L (ref 3.3–4.7)
Sodium: 145 mEq/L (ref 128–145)
TOTAL PROTEIN: 7.5 g/dL (ref 6.4–8.1)
Total Bilirubin: 0.7 mg/dl (ref 0.20–1.60)

## 2014-08-03 MED ORDER — FLUTICASONE PROPIONATE 50 MCG/ACT NA SUSP: 2.0000 | NASAL | Status: DC | PRN

## 2014-08-03 NOTE — Progress Notes (Signed)
Hematology and Oncology Follow Up Visit  Maureen Duncan [id number] [birth year] 63 y.o. 08/03/2014   Principle Diagnosis:  Stage IIb (T2N1M0) ductal carcinoma of the right breast-ER negative/HER-2 positive History of DVT of the left subclavian vein  Current Therapy:    Aspirin 162 mg by mouth daily     Interim History:  Ms.  Duncan is back for followup. Last saw her back in August. She is doing quite well. She had a good summer.  She's doing well right now. She is a little tendinitis in the right arm. She has a compression sleeve which seemed to help with this. She is seeing physical therapy.  She's had no problems with bowels or bladder. She's had no cough. There's been no first breath. She's had no leg swelling. There's been no rashes.   I do have to see when her last mammogram was. The last one that we have documented was 2 years ago.  Medications: Current outpatient prescriptions:aspirin 81 MG tablet, Take 81 mg by mouth daily., Disp: , Rfl: ;  Cholecalciferol (VITAMIN D) 2000 UNITS tablet, Take 2,000 Units by mouth daily., Disp: , Rfl: ;  diphenhydrAMINE (BENADRYL) 25 MG tablet, Take 25 mg by mouth every 6 (six) hours as needed., Disp: , Rfl: ;  ibuprofen (ADVIL,MOTRIN) 800 MG tablet, Take one bid with food, Disp: 20 tablet, Rfl: 0 loratadine (CLARITIN) 10 MG tablet, Take 1 tablet (10 mg total) by mouth daily., Disp: 30 tablet, Rfl: 11;  Melatonin 3 MG TABS, Take 1 tablet by mouth daily., Disp: , Rfl: ;  Multiple Vitamin (MULTIVITAMIN) capsule, Take 1 capsule by mouth daily., Disp: , Rfl: ;  Nutritional Supplements (EQUATE PO), Take by mouth as needed. As needed for pain, Disp: , Rfl: ;  Omega-3 Fatty Acids (FISH OIL) 1000 MG CAPS, Take by mouth every morning., Disp: , Rfl:  fluticasone (FLONASE) 50 MCG/ACT nasal spray, Place 2 sprays into both nostrils as needed., Disp: 16 g, Rfl: 2;  [DISCONTINUED] calcium-vitamin D (OSCAL) 250-125 MG-UNIT per tablet, Take 1 tablet by mouth daily.  ,  Disp: , Rfl:  No current facility-administered medications for this visit. Facility-Administered Medications Ordered in Other Visits: sodium chloride 0.9 % injection 10 mL, 10 mL, Intravenous, PRN, Volanda Napoleon, MD, 10 mL at 06/17/12 1651  Allergies:  Allergies  Allergen Reactions  . Adhesive [Tape] Rash    Past Medical History, Surgical history, Social history, and Family History were reviewed and updated.  Review of Systems: As above  Physical Exam:  height is 5' 5"  (1.651 m) and weight is 170 lb (77.111 kg). Her oral temperature is 97.7 F (36.5 C). Her blood pressure is 120/86 and her pulse is 87. Her respiration is 14.   Lungs are clear. Cardiac exam regular in room. Head exam shows no adenopathy. Breast exam shows left breast with no masses, edema or erythema. There is no left axillary adenopathy. Right chest wall shows well-healed mastectomy. She has some firmness a domestic restart. She's had some reddish changes. There's no nodularity. There is no right axillary adenopathy. Abdomen is soft it is good bowel sounds. There is no fluid wave. There is a palpable liver or spleen tip. Extremities shows some slight chronic nonpitting edema of the right arm. Left arm is unremarkable. Exam shows no tenderness over the spine, ribs or hips. Skin exam shows no rashes, ecchymosis or petechia. Neurological exam is nonfocal.  Lab Results  Component Value Date   WBC 5.5 08/03/2014   HGB 13.3  08/03/2014   HCT 40.4 08/03/2014   MCV 95 08/03/2014   PLT 256 08/03/2014     Chemistry      Component Value Date/Time   NA 137 03/30/2014 1146   NA 140 12/29/2013 0845   K 3.9 03/30/2014 1146   K 4.0 12/29/2013 0845   CL 100 03/30/2014 1146   CL 104 12/29/2013 0845   CO2 26 03/30/2014 1146   CO2 31 12/29/2013 0845   BUN 19 03/30/2014 1146   BUN 13 12/29/2013 0845   CREATININE 0.82 03/30/2014 1146   CREATININE 0.8 12/29/2013 0845      Component Value Date/Time   CALCIUM 10.2 03/30/2014 1146   CALCIUM 9.7 12/29/2013 0845    ALKPHOS 50 03/30/2014 1146   ALKPHOS 50 12/29/2013 0845   AST 20 03/30/2014 1146   AST 22 12/29/2013 0845   ALT 12 03/30/2014 1146   ALT 13 12/29/2013 0845   BILITOT 0.5 03/30/2014 1146   BILITOT 0.70 12/29/2013 0845         Impression and Plan: Maureen Duncan is 63 year old African female. She has a history of stage IIb ductal carcinoma of the right breast. Her tumor was HER-2 positive. She was treated with systemic chemotherapy followed by a year of Herceptin. She completed this in August of 2014.  Of note, she had 3 positive lymph nodes. She did receive 6 cycles of chemotherapy with Taxotere and carboplatinum.  Associated problems with respect to heart failure. I don't think we need to do any type of cardiac test on her.  We'll plan to go back to see [place] in 3-4 months.   Volanda Napoleon, MD 10/7/201510:16 AM

## 2014-08-04 LAB — VITAMIN D 25 HYDROXY (VIT D DEFICIENCY, FRACTURES): Vit D, 25-Hydroxy: 35 ng/mL (ref 30–89)

## 2014-08-05 ENCOUNTER — Telehealth: Payer: Self-pay | Admitting: Nurse Practitioner

## 2014-08-05 NOTE — Telephone Encounter (Addendum)
Message copied by Jimmy Footman on Fri Aug 05, 2014  9:59 AM ------      Message from: Burney Gauze R      Created: Thu Aug 04, 2014  6:08 PM       Call and let her know that her vitamin D is okay. No change in vitamin D dose. Pete ------Pt verbalized understanding and appreciation.

## 2014-09-05 ENCOUNTER — Ambulatory Visit: Payer: Federal, State, Local not specified - PPO | Admitting: Family Medicine

## 2014-09-07 ENCOUNTER — Encounter: Payer: Self-pay | Admitting: Hematology & Oncology

## 2014-09-08 ENCOUNTER — Encounter: Payer: Self-pay | Admitting: *Deleted

## 2014-09-27 ENCOUNTER — Ambulatory Visit: Payer: Federal, State, Local not specified - PPO | Admitting: Internal Medicine

## 2014-10-11 ENCOUNTER — Other Ambulatory Visit: Payer: Self-pay | Admitting: Obstetrics and Gynecology

## 2014-10-13 LAB — CYTOLOGY - PAP

## 2014-11-29 ENCOUNTER — Other Ambulatory Visit: Payer: Self-pay | Admitting: Family

## 2015-01-30 ENCOUNTER — Ambulatory Visit (HOSPITAL_BASED_OUTPATIENT_CLINIC_OR_DEPARTMENT_OTHER): Payer: Federal, State, Local not specified - PPO | Admitting: Hematology & Oncology

## 2015-01-30 ENCOUNTER — Other Ambulatory Visit (HOSPITAL_BASED_OUTPATIENT_CLINIC_OR_DEPARTMENT_OTHER): Payer: Federal, State, Local not specified - PPO

## 2015-01-30 ENCOUNTER — Encounter: Payer: Self-pay | Admitting: Hematology & Oncology

## 2015-01-30 VITALS — BP 150/96 | HR 73 | Temp 98.0°F | Resp 14 | Ht 65.0 in | Wt 176.0 lb

## 2015-01-30 DIAGNOSIS — Z853 Personal history of malignant neoplasm of breast: Secondary | ICD-10-CM

## 2015-01-30 DIAGNOSIS — C50912 Malignant neoplasm of unspecified site of left female breast: Secondary | ICD-10-CM

## 2015-01-30 DIAGNOSIS — M810 Age-related osteoporosis without current pathological fracture: Secondary | ICD-10-CM

## 2015-01-30 DIAGNOSIS — C50911 Malignant neoplasm of unspecified site of right female breast: Secondary | ICD-10-CM

## 2015-01-30 LAB — CBC WITH DIFFERENTIAL (CANCER CENTER ONLY)
BASO#: 0 10*3/uL (ref 0.0–0.2)
BASO%: 0.3 % (ref 0.0–2.0)
EOS ABS: 2 10*3/uL — AB (ref 0.0–0.5)
EOS%: 23.4 % — AB (ref 0.0–7.0)
HCT: 40.3 % (ref 34.8–46.6)
HGB: 13.3 g/dL (ref 11.6–15.9)
LYMPH#: 1.6 10*3/uL (ref 0.9–3.3)
LYMPH%: 18.1 % (ref 14.0–48.0)
MCH: 31.1 pg (ref 26.0–34.0)
MCHC: 33 g/dL (ref 32.0–36.0)
MCV: 94 fL (ref 81–101)
MONO#: 0.5 10*3/uL (ref 0.1–0.9)
MONO%: 6.1 % (ref 0.0–13.0)
NEUT#: 4.5 10*3/uL (ref 1.5–6.5)
NEUT%: 52.1 % (ref 39.6–80.0)
PLATELETS: 251 10*3/uL (ref 145–400)
RBC: 4.27 10*6/uL (ref 3.70–5.32)
RDW: 13.3 % (ref 11.1–15.7)
WBC: 8.6 10*3/uL (ref 3.9–10.0)

## 2015-01-30 LAB — CMP (CANCER CENTER ONLY)
ALK PHOS: 61 U/L (ref 26–84)
ALT(SGPT): 19 U/L (ref 10–47)
AST: 28 U/L (ref 11–38)
Albumin: 3.7 g/dL (ref 3.3–5.5)
BUN: 13 mg/dL (ref 7–22)
CO2: 27 mEq/L (ref 18–33)
Calcium: 9.5 mg/dL (ref 8.0–10.3)
Chloride: 102 mEq/L (ref 98–108)
Creat: 1.1 mg/dl (ref 0.6–1.2)
GLUCOSE: 105 mg/dL (ref 73–118)
POTASSIUM: 4.1 meq/L (ref 3.3–4.7)
Sodium: 143 mEq/L (ref 128–145)
Total Bilirubin: 0.8 mg/dl (ref 0.20–1.60)
Total Protein: 7.4 g/dL (ref 6.4–8.1)

## 2015-01-30 LAB — LACTATE DEHYDROGENASE: LDH: 203 U/L (ref 94–250)

## 2015-01-30 NOTE — Progress Notes (Signed)
Hematology and Oncology Follow Up Visit  ULAH OLMO [id number] 1951/04/03 64 y.o. 01/30/2015   Principle Diagnosis:  Stage IIb (T2N1M0) ductal carcinoma of the right breast-ER negative/HER-2 positive History of DVT of the left subclavian vein  Current Therapy:    Aspirin 162 mg by mouth daily     Interim History:  Ms.  Guarisco is back for followup. Last saw her back in October. She is doing quite well. She had a good winter. She had a good time over the holidays.  She's doing well right now. She is a little tendinitis in the right arm. She has a compression sleeve which seemed to help with this. She is still seeing physical therapy.  She's had no problems with bowels or bladder. She's had no cough. There's been no first breath. She's had no leg swelling. There's been no rashes.   I see that her last mammogram was done in November. Everything looked okay with respect to the left breast.   Medications:  Current outpatient prescriptions:  .  aspirin 81 MG tablet, Take 81 mg by mouth daily., Disp: , Rfl:  .  Cholecalciferol (VITAMIN D) 2000 UNITS tablet, Take 2,000 Units by mouth daily., Disp: , Rfl:  .  diphenhydrAMINE (BENADRYL) 25 MG tablet, Take 25 mg by mouth every 6 (six) hours as needed., Disp: , Rfl:  .  fluticasone (FLONASE) 50 MCG/ACT nasal spray, Place 2 sprays into both nostrils as needed., Disp: 16 g, Rfl: 2 .  ibuprofen (ADVIL,MOTRIN) 800 MG tablet, Take one bid with food, Disp: 20 tablet, Rfl: 0 .  loratadine (CLARITIN) 10 MG tablet, Take 1 tablet (10 mg total) by mouth daily., Disp: 30 tablet, Rfl: 11 .  Melatonin 3 MG TABS, Take 1 tablet by mouth daily., Disp: , Rfl:  .  Multiple Vitamin (MULTIVITAMIN) capsule, Take 1 capsule by mouth daily., Disp: , Rfl:  .  Nutritional Supplements (EQUATE PO), Take by mouth as needed. As needed for pain, Disp: , Rfl:  .  Omega-3 Fatty Acids (FISH OIL) 1000 MG CAPS, Take by mouth every morning., Disp: , Rfl:  .  [DISCONTINUED]  calcium-vitamin D (OSCAL) 250-125 MG-UNIT per tablet, Take 1 tablet by mouth daily.  , Disp: , Rfl:  No current facility-administered medications for this visit.  Facility-Administered Medications Ordered in Other Visits:  .  sodium chloride 0.9 % injection 10 mL, 10 mL, Intravenous, PRN, Volanda Napoleon, MD, 10 mL at 06/17/12 1651  Allergies:  Allergies  Allergen Reactions  . Adhesive [Tape] Rash    Past Medical History, Surgical history, Social history, and Family History were reviewed and updated.  Review of Systems: As above  Physical Exam:  height is 5' 5"  (1.651 m) and weight is 176 lb (79.833 kg). Her oral temperature is 98 F (36.7 C). Her blood pressure is 150/96 and her pulse is 73. Her respiration is 14.   Well-developed and well-nourished African-American female. Head and neck exam shows no ocular or oral lesions. She has no adenopathy in the neck. Lungs are clear. Cardiac exam regular rate and rhythm with no murmurs, rubs or bruits. We're not I'll go ahead rate was yes yes he did. Breast exam shows left breast with no masses, edema or erythema. There is no left axillary adenopathy. Right chest wall shows well-healed mastectomy. She has some firmness a domestic restart. She's had some reddish changes. There's no nodularity. There is no right axillary adenopathy. Abdomen is soft . She has good bowel sounds. There  is no fluid wave. There is no palpable liver or spleen tip. Extremities shows some slight chronic nonpitting edema of the right arm. Left arm is unremarkable. Back exam shows no tenderness over the spine, ribs or hips. Skin exam shows no rashes, ecchymosis or petechia. Neurological exam is nonfocal.  Lab Results  Component Value Date   WBC 8.6 01/30/2015   HGB 13.3 01/30/2015   HCT 40.3 01/30/2015   MCV 94 01/30/2015   PLT 251 01/30/2015     Chemistry      Component Value Date/Time   NA 145 08/03/2014 0926   NA 137 03/30/2014 1146   K 3.6 08/03/2014 0926   K  3.9 03/30/2014 1146   CL 97* 08/03/2014 0926   CL 100 03/30/2014 1146   CO2 29 08/03/2014 0926   CO2 26 03/30/2014 1146   BUN 14 08/03/2014 0926   BUN 19 03/30/2014 1146   CREATININE 0.8 08/03/2014 0926   CREATININE 0.82 03/30/2014 1146      Component Value Date/Time   CALCIUM 10.2 08/03/2014 0926   CALCIUM 10.2 03/30/2014 1146   ALKPHOS 49 08/03/2014 0926   ALKPHOS 50 03/30/2014 1146   AST 22 08/03/2014 0926   AST 20 03/30/2014 1146   ALT 15 08/03/2014 0926   ALT 12 03/30/2014 1146   BILITOT 0.70 08/03/2014 0926   BILITOT 0.5 03/30/2014 1146         Impression and Plan: Ms. Turrubiates is 64 year old African female. She has a history of stage IIb ductal carcinoma of the right breast. Her tumor was HER-2 positive. She was treated with systemic chemotherapy followed by a year of Herceptin. She completed this in August of 2014.  Of note, she had 3 positive lymph nodes. She did receive 6 cycles of chemotherapy with Taxotere and carboplatinum.  She has had no problems with respect to heart failure. I don't think we need to do any type of cardiac test on her. Her last MUGA scan was done back in May 2015. She had a ejection fraction of 54%.  We'll plan to go back to see [place] in 6 months.   Volanda Napoleon, MD 4/4/201610:33 AM

## 2015-04-27 NOTE — H&P (Signed)
PERNIE GROSSO  DICTATION # [id number] CSN# [id number]   Margarette Asal, MD 04/27/2015 1:46 PM

## 2015-04-28 NOTE — H&P (Signed)
NAMEMARGAN, Maureen NO.:  [id number]  MEDICAL RECORD NO.:  [id number]  LOCATION:                               FACILITY:  [facility]  PHYSICIAN:  Ralene Bathe. Matthew Saras, M.D.DATE OF BIRTH:  09-12-1951  DATE OF ADMISSION:  05/08/2015 DATE OF DISCHARGE:                             HISTORY & PHYSICAL   CHIEF COMPLAINT:  Symptomatic uterine prolapse with cystocele.  HISTORY OF PRESENT ILLNESS:  A 64 year old postmenopausal patient G2, P2.  She has had a prior tubal, not currently sexually active.  The patient has a history of breast cancer, treated in 2013 by right mastectomy.  She is not currently on any ongoing breast cancer treatment and has done well.  She has noted over the last couple of years, she has had increased pressure especially with standing, although she has not had SUI.  Prior exam had shown findings consistent with moderate uterine prolapse and cystocele.  Her posterior support looked reasonably normal as did the upper vault.  She presents now for LAVH bilateral salpingectomy with anterior repair.  We had discussed the possibility of BSO at the same time, which may change on the consent if she wishes to proceed with BSO at the same time.  This procedure including specific risks related to bleeding, infection, adjacent organ injury, transfusion, wound infection, phlebitis, and the possible need to complete the surgery by open technique all reviewed with her along with her expected recovery time.  PAST HISTORY MEDICATIONS:  Allergies none.  CURRENT MEDICATIONS:  Baby aspirin, vitamin D, fish oil supplements, and vitamins.  PAST SURGICAL HISTORY:  She has had 2 vaginal deliveries, right mastectomy, prior tubal.  REVIEW OF SYSTEMS:  Significant for her personal history of breast cancer.  SOCIAL HISTORY:  Denies alcohol, tobacco, or drug use.  She is single.  FAMILY HISTORY:  Significant for diabetes, otherwise unremarkable.  PHYSICAL EXAMINATION:   HEENT:  Unremarkable. NECK:  Supple without masses. LUNGS:  Clear. CARDIOVASCULAR:  Regular rate and rhythm without murmurs, rubs, or gallops noted. BREASTS:  Right breast surgically absent.  Left breast supraclavicular and axillary areas negative to palpation. ABDOMEN:  Soft, flat, nontender.  Vulva, vagina, cervix normal.  Uterus normal size.  On straining, there is moderate prolapse with a cystocele. Posterior support looks good.  Bimanual otherwise negative. EXTREMITIES:  Unremarkable. NEUROLOGIC:  Unremarkable.  IMPRESSION:  Symptomatic uterine prolapse with cystocele.  PLAN:  LAVH, bilateral salpingectomy, possible BSO with cystocele repair.  Procedure and risks discussed as above.     Desean Heemstra M. Matthew Saras, M.D.     [facility]/MEDQ  D:  04/27/2015  T:  04/28/2015  Job:  [id number]

## 2015-05-03 ENCOUNTER — Encounter (HOSPITAL_BASED_OUTPATIENT_CLINIC_OR_DEPARTMENT_OTHER): Payer: Self-pay | Admitting: *Deleted

## 2015-05-03 NOTE — Progress Notes (Signed)
NPO AFTER MN.  ARRIVE AT 0600.  NEEDS CBC, T & S.  REVIEWED [facility] GUIDELINES.

## 2015-05-07 NOTE — Anesthesia Preprocedure Evaluation (Addendum)
Anesthesia Evaluation  Patient identified by MRN, date of birth, ID band Patient awake    Reviewed: Allergy & Precautions, H&P , NPO status , Patient's Chart, lab work & pertinent test results  History of Anesthesia Complications Negative for: history of anesthetic complications  Airway Mallampati: I   Neck ROM: Full    Dental  (+) Teeth Intact, Dental Advisory Given   Pulmonary former smoker (during her 28s, none since),  breath sounds clear to auscultation        Cardiovascular negative cardio ROS  Rhythm:Regular Rate:Normal     Neuro/Psych    GI/Hepatic negative GI ROS, Neg liver ROS,   Endo/Other  negative endocrine ROS  Renal/GU negative Renal ROS     Musculoskeletal   Abdominal   Peds  Hematology negative hematology ROS (+)   Anesthesia Other Findings NPO appropriate, no meds today, allergies reviewed Denies active cardiac or pulmonary symptoms, METS > 4 No recent congestive cough or symptoms of upper respiratory infection - Right arm precautions given history of breast cancer and axillary lymph node removals, s/p chemo, history of DVT related to that time period   Reproductive/Obstetrics                          Anesthesia Physical  Anesthesia Plan  ASA: II  Anesthesia Plan: General   Post-op Pain Management:    Induction: Intravenous  Airway Management Planned: Oral ETT  Additional Equipment:   Intra-op Plan:   Post-operative Plan: Extubation in OR  Informed Consent: I have reviewed the patients History and Physical, chart, labs and discussed the procedure including the risks, benefits and alternatives for the proposed anesthesia with the patient or authorized representative who has indicated his/her understanding and acceptance.   Dental advisory given  Plan Discussed with: CRNA  Anesthesia Plan Comments:         Anesthesia Quick Evaluation

## 2015-05-08 ENCOUNTER — Ambulatory Visit (HOSPITAL_BASED_OUTPATIENT_CLINIC_OR_DEPARTMENT_OTHER): Payer: Federal, State, Local not specified - PPO | Admitting: Anesthesiology

## 2015-05-08 ENCOUNTER — Observation Stay (HOSPITAL_BASED_OUTPATIENT_CLINIC_OR_DEPARTMENT_OTHER)
Admission: AD | Admit: 2015-05-08 | Discharge: 2015-05-09 | Disposition: A | Discharge status: Home / Self Care | Payer: Federal, State, Local not specified - PPO | Source: Ambulatory Visit | Attending: Obstetrics and Gynecology | Admitting: Obstetrics and Gynecology

## 2015-05-08 ENCOUNTER — Encounter (HOSPITAL_COMMUNITY)
Admission: AD | Disposition: A | Discharge status: Home / Self Care | Payer: Self-pay | Source: Ambulatory Visit | Attending: Obstetrics and Gynecology

## 2015-05-08 ENCOUNTER — Encounter (HOSPITAL_BASED_OUTPATIENT_CLINIC_OR_DEPARTMENT_OTHER): Payer: Self-pay

## 2015-05-08 DIAGNOSIS — Z87891 Personal history of nicotine dependence: Secondary | ICD-10-CM | POA: Insufficient documentation

## 2015-05-08 DIAGNOSIS — Z9011 Acquired absence of right breast and nipple: Secondary | ICD-10-CM | POA: Insufficient documentation

## 2015-05-08 DIAGNOSIS — N814 Uterovaginal prolapse, unspecified: Principal | ICD-10-CM | POA: Insufficient documentation

## 2015-05-08 DIAGNOSIS — Z79899 Other long term (current) drug therapy: Secondary | ICD-10-CM | POA: Insufficient documentation

## 2015-05-08 DIAGNOSIS — N8110 Cystocele, unspecified: Secondary | ICD-10-CM | POA: Diagnosis present

## 2015-05-08 DIAGNOSIS — N838 Other noninflammatory disorders of ovary, fallopian tube and broad ligament: Secondary | ICD-10-CM | POA: Insufficient documentation

## 2015-05-08 DIAGNOSIS — D259 Leiomyoma of uterus, unspecified: Secondary | ICD-10-CM | POA: Diagnosis not present

## 2015-05-08 DIAGNOSIS — Z853 Personal history of malignant neoplasm of breast: Secondary | ICD-10-CM | POA: Diagnosis not present

## 2015-05-08 HISTORY — DX: Lymphedema, not elsewhere classified: I89.0

## 2015-05-08 HISTORY — PX: LAPAROSCOPIC BILATERAL SALPINGO OOPHERECTOMY: SHX5890

## 2015-05-08 HISTORY — DX: Presence of dental prosthetic device (complete) (partial): Z97.2

## 2015-05-08 HISTORY — PX: LAPAROSCOPIC ASSISTED VAGINAL HYSTERECTOMY: SHX5398

## 2015-05-08 HISTORY — DX: Uterovaginal prolapse, unspecified: N81.4

## 2015-05-08 HISTORY — PX: CYSTOCELE REPAIR: SHX163

## 2015-05-08 HISTORY — DX: Reserved for concepts with insufficient information to code with codable children: IMO0002

## 2015-05-08 HISTORY — DX: Personal history of other venous thrombosis and embolism: Z86.718

## 2015-05-08 HISTORY — DX: Age-related osteoporosis without current pathological fracture: M81.0

## 2015-05-08 LAB — ABO/RH: ABO/RH(D): O POS

## 2015-05-08 LAB — TYPE AND SCREEN
ABO/RH(D): O POS
Antibody Screen: NEGATIVE

## 2015-05-08 LAB — CBC
HCT: 38.8 % (ref 36.0–46.0)
HEMOGLOBIN: 12.5 g/dL (ref 12.0–15.0)
MCH: 30.3 pg (ref 26.0–34.0)
MCHC: 32.2 g/dL (ref 30.0–36.0)
MCV: 93.9 fL (ref 78.0–100.0)
PLATELETS: 251 10*3/uL (ref 150–400)
RBC: 4.13 MIL/uL (ref 3.87–5.11)
RDW: 13.7 % (ref 11.5–15.5)
WBC: 6.9 10*3/uL (ref 4.0–10.5)

## 2015-05-08 SURGERY — HYSTERECTOMY, VAGINAL, LAPAROSCOPY-ASSISTED
Anesthesia: General | Site: Vagina

## 2015-05-08 MED ORDER — MENTHOL 3 MG MT LOZG
1.0000 | LOZENGE | OROMUCOSAL | Status: DC | PRN
  Filled 2015-05-08: qty 9

## 2015-05-08 MED ORDER — FENTANYL CITRATE (PF) 100 MCG/2ML IJ SOLN
INTRAMUSCULAR | Status: AC
  Filled 2015-05-08: qty 2

## 2015-05-08 MED ORDER — OXYCODONE HCL 5 MG PO TABS
5.0000 mg | ORAL_TABLET | Freq: Once | ORAL | Status: DC | PRN
  Filled 2015-05-08: qty 1

## 2015-05-08 MED ORDER — ACETAMINOPHEN 10 MG/ML IV SOLN
INTRAVENOUS | Status: DC | PRN
  Administered 2015-05-08: 1000 mg via INTRAVENOUS

## 2015-05-08 MED ORDER — PROMETHAZINE HCL 25 MG/ML IJ SOLN
6.2500 mg | INTRAMUSCULAR | Status: DC | PRN
  Filled 2015-05-08: qty 1

## 2015-05-08 MED ORDER — ONDANSETRON HCL 4 MG/2ML IJ SOLN
4.0000 mg | Freq: Four times a day (QID) | INTRAMUSCULAR | Status: DC | PRN
  Filled 2015-05-08: qty 2

## 2015-05-08 MED ORDER — FLUTICASONE PROPIONATE 50 MCG/ACT NA SUSP
2.0000 | NASAL | Status: DC | PRN
  Filled 2015-05-08: qty 16

## 2015-05-08 MED ORDER — DEXTROSE IN LACTATED RINGERS 5 % IV SOLN
INTRAVENOUS | Status: DC
Start: 2015-05-08 — End: 2015-05-09
  Administered 2015-05-08: 1000 mL via INTRAVENOUS
  Administered 2015-05-08: 14:00:00 via INTRAVENOUS
  Filled 2015-05-08: qty 1000

## 2015-05-08 MED ORDER — HYDROMORPHONE HCL 1 MG/ML IJ SOLN
INTRAMUSCULAR | Status: AC
  Filled 2015-05-08: qty 1

## 2015-05-08 MED ORDER — KETOROLAC TROMETHAMINE 30 MG/ML IJ SOLN
INTRAMUSCULAR | Status: DC | PRN
  Administered 2015-05-08: 30 mg via INTRAVENOUS

## 2015-05-08 MED ORDER — LACTATED RINGERS IV SOLN
INTRAVENOUS | Status: DC
  Administered 2015-05-08 (×2): via INTRAVENOUS
  Filled 2015-05-08: qty 1000

## 2015-05-08 MED ORDER — IBUPROFEN 800 MG PO TABS
800.0000 mg | ORAL_TABLET | Freq: Three times a day (TID) | ORAL | Status: DC | PRN
  Filled 2015-05-08: qty 1

## 2015-05-08 MED ORDER — DEXTROSE 5 % IV SOLN
2.0000 g | INTRAVENOUS | Status: AC
  Administered 2015-05-08: 2 g via INTRAVENOUS
  Filled 2015-05-08: qty 2

## 2015-05-08 MED ORDER — MIDAZOLAM HCL 2 MG/2ML IJ SOLN
INTRAMUSCULAR | Status: AC
  Filled 2015-05-08: qty 2

## 2015-05-08 MED ORDER — SCOPOLAMINE 1 MG/3DAYS TD PT72
MEDICATED_PATCH | TRANSDERMAL | Status: AC
  Filled 2015-05-08: qty 1

## 2015-05-08 MED ORDER — CEFOTETAN DISODIUM-DEXTROSE 2-2.08 GM-% IV SOLR
INTRAVENOUS | Status: AC
  Filled 2015-05-08: qty 50

## 2015-05-08 MED ORDER — FENTANYL CITRATE (PF) 100 MCG/2ML IJ SOLN
INTRAMUSCULAR | Status: DC | PRN
  Administered 2015-05-08: 50 ug via INTRAVENOUS
  Administered 2015-05-08: 100 ug via INTRAVENOUS
  Administered 2015-05-08: 50 ug via INTRAVENOUS

## 2015-05-08 MED ORDER — OXYCODONE-ACETAMINOPHEN 5-325 MG PO TABS
1.0000 | ORAL_TABLET | ORAL | Status: DC | PRN
  Administered 2015-05-09 (×2): 1 via ORAL
  Filled 2015-05-08: qty 1
  Filled 2015-05-08: qty 2
  Filled 2015-05-08: qty 1

## 2015-05-08 MED ORDER — KETOROLAC TROMETHAMINE 30 MG/ML IJ SOLN
30.0000 mg | Freq: Four times a day (QID) | INTRAMUSCULAR | Status: DC
  Administered 2015-05-08 – 2015-05-09 (×3): 30 mg via INTRAVENOUS
  Filled 2015-05-08 (×9): qty 1

## 2015-05-08 MED ORDER — MORPHINE SULFATE 1 MG/ML IV SOLN
INTRAVENOUS | Status: DC
  Administered 2015-05-08: 13:00:00 via INTRAVENOUS
  Administered 2015-05-08: 4 mg via INTRAVENOUS
  Administered 2015-05-08: 1 mg via INTRAVENOUS
  Filled 2015-05-08: qty 25

## 2015-05-08 MED ORDER — BUTORPHANOL TARTRATE 1 MG/ML IJ SOLN
1.0000 mg | INTRAMUSCULAR | Status: DC | PRN
  Filled 2015-05-08: qty 2

## 2015-05-08 MED ORDER — DIPHENHYDRAMINE HCL 50 MG/ML IJ SOLN
12.5000 mg | Freq: Four times a day (QID) | INTRAMUSCULAR | Status: DC | PRN
  Filled 2015-05-08: qty 0.25

## 2015-05-08 MED ORDER — BUPIVACAINE HCL 0.25 % IJ SOLN
INTRAMUSCULAR | Status: DC | PRN
  Administered 2015-05-08: 9 mL

## 2015-05-08 MED ORDER — FENTANYL CITRATE (PF) 100 MCG/2ML IJ SOLN
25.0000 ug | INTRAMUSCULAR | Status: DC | PRN
  Administered 2015-05-08 (×3): 50 ug via INTRAVENOUS
  Filled 2015-05-08: qty 1

## 2015-05-08 MED ORDER — SCOPOLAMINE 1 MG/3DAYS TD PT72
MEDICATED_PATCH | TRANSDERMAL | Status: DC | PRN
  Administered 2015-05-08: 1 via TRANSDERMAL

## 2015-05-08 MED ORDER — DIPHENHYDRAMINE HCL 12.5 MG/5ML PO ELIX
12.5000 mg | ORAL_SOLUTION | Freq: Four times a day (QID) | ORAL | Status: DC | PRN
  Filled 2015-05-08: qty 5

## 2015-05-08 MED ORDER — GLYCOPYRROLATE 0.2 MG/ML IJ SOLN
INTRAMUSCULAR | Status: DC | PRN
  Administered 2015-05-08: 0.4 mg via INTRAVENOUS

## 2015-05-08 MED ORDER — LIDOCAINE HCL (CARDIAC) 20 MG/ML IV SOLN
INTRAVENOUS | Status: DC | PRN
  Administered 2015-05-08: 80 mg via INTRAVENOUS

## 2015-05-08 MED ORDER — FENTANYL CITRATE (PF) 100 MCG/2ML IJ SOLN
INTRAMUSCULAR | Status: AC
  Filled 2015-05-08: qty 4

## 2015-05-08 MED ORDER — ONDANSETRON HCL 4 MG PO TABS
4.0000 mg | ORAL_TABLET | Freq: Four times a day (QID) | ORAL | Status: DC | PRN
  Filled 2015-05-08: qty 1

## 2015-05-08 MED ORDER — HYDROMORPHONE HCL 1 MG/ML IJ SOLN
0.2500 mg | INTRAMUSCULAR | Status: DC | PRN
  Administered 2015-05-08 (×6): 0.25 mg via INTRAVENOUS
  Filled 2015-05-08: qty 1

## 2015-05-08 MED ORDER — SODIUM CHLORIDE 0.9 % IJ SOLN
9.0000 mL | INTRAMUSCULAR | Status: DC | PRN
  Filled 2015-05-08: qty 9

## 2015-05-08 MED ORDER — ROCURONIUM BROMIDE 100 MG/10ML IV SOLN
INTRAVENOUS | Status: DC | PRN
  Administered 2015-05-08: 50 mg via INTRAVENOUS

## 2015-05-08 MED ORDER — PROPOFOL 10 MG/ML IV BOLUS
INTRAVENOUS | Status: DC | PRN
  Administered 2015-05-08: 150 mg via INTRAVENOUS

## 2015-05-08 MED ORDER — DEXAMETHASONE SODIUM PHOSPHATE 4 MG/ML IJ SOLN
INTRAMUSCULAR | Status: DC | PRN
  Administered 2015-05-08: 10 mg via INTRAVENOUS

## 2015-05-08 MED ORDER — LORATADINE 10 MG PO TABS
10.0000 mg | ORAL_TABLET | Freq: Every day | ORAL | Status: DC
  Filled 2015-05-08 (×3): qty 1

## 2015-05-08 MED ORDER — ESTRADIOL 0.1 MG/GM VA CREA
TOPICAL_CREAM | VAGINAL | Status: DC | PRN
  Administered 2015-05-08: 1 via VAGINAL

## 2015-05-08 MED ORDER — NALOXONE HCL 0.4 MG/ML IJ SOLN
0.4000 mg | INTRAMUSCULAR | Status: DC | PRN
  Filled 2015-05-08: qty 1

## 2015-05-08 MED ORDER — KETOROLAC TROMETHAMINE 30 MG/ML IJ SOLN
30.0000 mg | Freq: Four times a day (QID) | INTRAMUSCULAR | Status: DC
  Filled 2015-05-08 (×5): qty 1

## 2015-05-08 MED ORDER — ONDANSETRON HCL 4 MG/2ML IJ SOLN
INTRAMUSCULAR | Status: DC | PRN
  Administered 2015-05-08: 4 mg via INTRAVENOUS

## 2015-05-08 MED ORDER — MIDAZOLAM HCL 5 MG/5ML IJ SOLN
INTRAMUSCULAR | Status: DC | PRN
  Administered 2015-05-08: 2 mg via INTRAVENOUS

## 2015-05-08 MED ORDER — ONDANSETRON HCL 4 MG/2ML IJ SOLN: 4.0000 mg | Freq: Four times a day (QID) | INTRAMUSCULAR | Status: DC | PRN

## 2015-05-08 MED ORDER — KETOROLAC TROMETHAMINE 30 MG/ML IJ SOLN: 30.0000 mg | Freq: Once | INTRAMUSCULAR | Status: DC

## 2015-05-08 MED ORDER — NEOSTIGMINE METHYLSULFATE 10 MG/10ML IV SOLN
INTRAVENOUS | Status: DC | PRN
  Administered 2015-05-08: 3 mg via INTRAVENOUS

## 2015-05-08 SURGICAL SUPPLY — 59 items
BAG URINE DRAINAGE (UROLOGICAL SUPPLIES) ×4 IMPLANT
BLADE SURG 10 STRL SS (BLADE) ×4 IMPLANT
BLADE SURG 11 STRL SS (BLADE) ×4 IMPLANT
CATH FOLEY 2WAY SLVR  5CC 14FR (CATHETERS) ×1
CATH FOLEY 2WAY SLVR 5CC 14FR (CATHETERS) ×3 IMPLANT
CATH ROBINSON RED A/P 16FR (CATHETERS) ×4 IMPLANT
CLEANER CAUTERY TIP 5X5 PAD (MISCELLANEOUS) ×3 IMPLANT
COVER BACK TABLE 60X90IN (DRAPES) ×8 IMPLANT
COVER MAYO STAND STRL (DRAPES) ×8 IMPLANT
DRAPE LG THREE QUARTER DISP (DRAPES) ×4 IMPLANT
DRAPE UNDERBUTTOCKS STRL (DRAPE) ×4 IMPLANT
DRSG OPSITE POSTOP 3X4 (GAUZE/BANDAGES/DRESSINGS) ×1 IMPLANT
ELECT LIGASURE SHORT 9 REUSE (ELECTRODE) ×4 IMPLANT
ELECT REM PT RETURN 9FT ADLT (ELECTROSURGICAL) ×4
ELECTRODE REM PT RTRN 9FT ADLT (ELECTROSURGICAL) ×3 IMPLANT
GLOVE BIO SURGEON STRL SZ 6.5 (GLOVE) ×4 IMPLANT
GLOVE BIO SURGEON STRL SZ7 (GLOVE) ×14 IMPLANT
GLOVE BIOGEL PI IND STRL 7.0 (GLOVE) IMPLANT
GLOVE BIOGEL PI IND STRL 7.5 (GLOVE) IMPLANT
GLOVE BIOGEL PI INDICATOR 7.0 (GLOVE) ×1
GLOVE BIOGEL PI INDICATOR 7.5 (GLOVE) ×1
GLOVE SURG SS PI 7.5 STRL IVOR (GLOVE) ×3 IMPLANT
GOWN STRL REUS W/ TWL LRG LVL3 (GOWN DISPOSABLE) ×12 IMPLANT
GOWN STRL REUS W/TWL LRG LVL3 (GOWN DISPOSABLE) ×16
HOLDER FOLEY CATH W/STRAP (MISCELLANEOUS) ×4 IMPLANT
LIQUID BAND (GAUZE/BANDAGES/DRESSINGS) ×7 IMPLANT
NDL HYPO 25X1 1.5 SAFETY (NEEDLE) ×3 IMPLANT
NDL INSUFFLATION 14GA 120MM (NEEDLE) ×3 IMPLANT
NEEDLE HYPO 25X1 1.5 SAFETY (NEEDLE) ×4 IMPLANT
NEEDLE INSUFFLATION 14GA 120MM (NEEDLE) ×4 IMPLANT
NS IRRIG 500ML POUR BTL (IV SOLUTION) ×4 IMPLANT
PACK BASIN DAY SURGERY FS (CUSTOM PROCEDURE TRAY) ×4 IMPLANT
PACKING VAGINAL (PACKING) ×1 IMPLANT
PAD CLEANER CAUTERY TIP 5X5 (MISCELLANEOUS) ×1
PAD OB MATERNITY 4.3X12.25 (PERSONAL CARE ITEMS) ×4 IMPLANT
PADDING ION DISPOSABLE (MISCELLANEOUS) ×4 IMPLANT
PENCIL BUTTON HOLSTER BLD 10FT (ELECTRODE) ×4 IMPLANT
SEALER TISSUE G2 CVD JAW 45CM (ENDOMECHANICALS) ×4 IMPLANT
SHEET LAVH (DRAPES) ×4 IMPLANT
SOLUTION ANTI FOG 6CC (MISCELLANEOUS) ×4 IMPLANT
SPONGE LAP 4X18 X RAY DECT (DISPOSABLE) ×4 IMPLANT
SUT MON AB 2-0 CT1 36 (SUTURE) ×8 IMPLANT
SUT VIC AB 0 CT1 18XCR BRD8 (SUTURE) ×9 IMPLANT
SUT VIC AB 0 CT1 8-18 (SUTURE) ×8
SUT VIC AB 2-0 UR6 27 (SUTURE) ×3 IMPLANT
SUT VICRYL 0 TIES 12 18 (SUTURE) ×4 IMPLANT
SUT VICRYL 4-0 PS2 18IN ABS (SUTURE) ×4 IMPLANT
SYR BULB IRRIGATION 50ML (SYRINGE) ×4 IMPLANT
SYR CONTROL 10ML LL (SYRINGE) ×4 IMPLANT
SYRINGE 10CC LL (SYRINGE) ×8 IMPLANT
TOWEL OR 17X24 6PK STRL BLUE (TOWEL DISPOSABLE) ×8 IMPLANT
TRAY DSU PREP LF (CUSTOM PROCEDURE TRAY) ×4 IMPLANT
TROCAR OPTI TIP 5M 100M (ENDOMECHANICALS) ×4 IMPLANT
TROCAR XCEL DIL TIP R 11M (ENDOMECHANICALS) ×4 IMPLANT
TUBE CONNECTING 12X1/4 (SUCTIONS) ×8 IMPLANT
TUBING INSUFFLATION 10FT LAP (TUBING) ×4 IMPLANT
WARMER LAPAROSCOPE (MISCELLANEOUS) ×4 IMPLANT
WATER STERILE IRR 500ML POUR (IV SOLUTION) ×4 IMPLANT
YANKAUER SUCT BULB TIP NO VENT (SUCTIONS) ×4 IMPLANT

## 2015-05-08 NOTE — Op Note (Signed)
Preoperative diagnosis: Symptomatic uterine prolapse with cystocele, breast-cancer survivor  Postoperative diagnosis: Same  Procedure: LAVH, BSO, cystocele repair  Surgeon: Matthew Saras patient taken the operating room after an adequate level of general anesthesia was obtained with the patient's legs in stirrups, the abdomen perineum and vagina were prepped and draped in the usual fashion, the bladder was drained, EUA carried out uterus was small, mobile adnexa unremarkable, moderate cystocele noted. Hulka tenaculum was positioned. This was all done after appropriate timeouts were taken. Attention directed to the subumbilical area which was infiltrated with quarter percent Marcaine plain, the varies needle was introduced without difficulty, its intra-abdominal position was verified by pressure water testing. After 2-1/2 L pneumoperitoneum syncopated, lap scopic trocar and sleeve were then introduced without difficulty. There was no evidence of any bleeding or trauma.  Patient was then placed in Trendelenburg and the pelvic findings as follows:  Upper abdomen unremarkable uterus itself was small and ovaries were atrophic, cul-de-sac and anterior space was free and clear. Legs were extended, weighted speculum was positioned the cervix was grasped with tenaculum, cervical vaginal mucosa was incised circumferentially, posterior culdotomy performed without difficulty. The bladder was advanced superiorly with sharp and blunt dissection until the anterior peritoneal reflection could be identified. The peritoneum was then entered sharply and a retractor then used to gently elevate the bladder out of the field. Using the LigaSure device, the uterosacral ligament, cardinal ligament and uterine vasculature pedicles were coagulated and divided.  The vaginal cuff was then closed with a running locked oh Vicryls suture from 3 to 9:00. McCall's culdoplasty suture was then placed from left uterosacral ligament picking up  posterior peritoneum across to the other uterosacral ligament which was then tied down for extra posterior support. Prior to closure sponge, needle, instrument counts reported as correct 2. The vaginal cuff was then closed from right to left with 2-oh Vicryls interrupted sutures.  Vaginal mucosa placed on traction from the UV angle down to the anterior cuff this was done divided in the midline the underlying perivesical tissue was separated with sharp and blunt dissection. Plicating sutures were placed of of 20 Vicryls. Excess vaginal mucosa was trimmed, vaginal mucosa then closed with 30 Vicryls repeat sutures. Vaginal pack with Sherlynn Stalls GEN long with a Foley catheter draining clear urine.  Repeat laparoscopy at reduced pressure revealed the operative site to be dry. Instruments removed, gas allowed to escape, the defect closed with 40 and Dermabond on the lower incision she tolerated this well went to recovery room in good condition.  Dictated with [employer].

## 2015-05-08 NOTE — Progress Notes (Signed)
The patient was re-examined , as discussed pre op in our office, pt prefers LAVH + BSO and ant repair

## 2015-05-08 NOTE — Anesthesia Postprocedure Evaluation (Signed)
  Anesthesia Post-op Note  Patient: Maureen Duncan  Procedure(s) Performed: Procedure(s) (LRB): LAPAROSCOPIC ASSISTED VAGINAL HYSTERECTOMY (N/A) LAPAROSCOPIC BILATERAL SALPINGO OOPHORECTOMY (Bilateral) ANTERIOR REPAIR (CYSTOCELE) (N/A)  Patient Location: PACU  Anesthesia Type: General  Level of Consciousness: awake and alert   Airway and Oxygen Therapy: Patient Spontanous Breathing  Post-op Pain: mild  Post-op Assessment: Post-op Vital signs reviewed, Patient's Cardiovascular Status Stable, Respiratory Function Stable, Patent Airway and No signs of Nausea or vomiting  Last Vitals:  Filed Vitals:   05/08/15 1125  BP: 129/78  Pulse: 85  Temp: 36.9 C  Resp:     Post-op Vital Signs: stable   Complications: No apparent anesthesia complications

## 2015-05-08 NOTE — Progress Notes (Signed)
Morphine PCA discontinued , Pt is tolerating regular diet.

## 2015-05-08 NOTE — Anesthesia Procedure Notes (Signed)
Procedure Name: Intubation Date/Time: 05/08/2015 7:38 AM Performed by: Wanita Chamberlain Pre-anesthesia Checklist: Patient identified, Emergency Drugs available, Suction available, Patient being monitored and Timeout performed Patient Re-evaluated:Patient Re-evaluated prior to inductionOxygen Delivery Method: Circle system utilized Preoxygenation: Pre-oxygenation with 100% oxygen Intubation Type: IV induction Ventilation: Mask ventilation without difficulty Laryngoscope Size: Mac and 3 Tube type: Oral Tube size: 7.0 mm Number of attempts: 1 Airway Equipment and Method: Stylet and Bite block Placement Confirmation: positive ETCO2,  breath sounds checked- equal and bilateral and ETT inserted through vocal cords under direct vision Secured at: 22 cm Tube secured with: Tape Dental Injury: Teeth and Oropharynx as per pre-operative assessment

## 2015-05-08 NOTE — Transfer of Care (Signed)
Immediate Anesthesia Transfer of Care Note  Patient: Maureen Duncan  Procedure(s) Performed: Procedure(s): LAPAROSCOPIC ASSISTED VAGINAL HYSTERECTOMY (N/A) LAPAROSCOPIC BILATERAL SALPINGO OOPHORECTOMY (Bilateral) ANTERIOR REPAIR (CYSTOCELE) (N/A)  Patient Location: PACU  Anesthesia Type:General  Level of Consciousness: awake, alert , oriented and patient cooperative  Airway & Oxygen Therapy: Patient Spontanous Breathing and Patient connected to face mask oxygen  Post-op Assessment: Report given to RN and Post -op Vital signs reviewed and stable  Post vital signs: Reviewed and stable  Last Vitals:  Filed Vitals:   05/08/15 0902  BP: 167/96  Pulse: 98  Temp: 36.6 C  Resp: 11    Complications: No apparent anesthesia complications

## 2015-05-09 ENCOUNTER — Encounter (HOSPITAL_BASED_OUTPATIENT_CLINIC_OR_DEPARTMENT_OTHER): Payer: Self-pay | Admitting: Obstetrics and Gynecology

## 2015-05-09 DIAGNOSIS — N814 Uterovaginal prolapse, unspecified: Secondary | ICD-10-CM | POA: Diagnosis not present

## 2015-05-09 LAB — CBC
HCT: 35.1 % — ABNORMAL LOW (ref 36.0–46.0)
Hemoglobin: 11.3 g/dL — ABNORMAL LOW (ref 12.0–15.0)
MCH: 30 pg (ref 26.0–34.0)
MCHC: 32.2 g/dL (ref 30.0–36.0)
MCV: 93.1 fL (ref 78.0–100.0)
PLATELETS: 201 10*3/uL (ref 150–400)
RBC: 3.77 MIL/uL — ABNORMAL LOW (ref 3.87–5.11)
RDW: 13.8 % (ref 11.5–15.5)
WBC: 13.7 10*3/uL — ABNORMAL HIGH (ref 4.0–10.5)

## 2015-05-09 MED ORDER — OXYCODONE-ACETAMINOPHEN 5-325 MG PO TABS: 1.0000 | ORAL_TABLET | ORAL | Status: DC | PRN

## 2015-05-09 MED ORDER — IBUPROFEN 800 MG PO TABS: 800.0000 mg | ORAL_TABLET | Freq: Three times a day (TID) | ORAL | Status: DC | PRN

## 2015-05-09 NOTE — Discharge Summary (Signed)
Physician Discharge Summary  Patient ID: Maureen Duncan MRN: [id number] DOB/AGE: 1951/01/26 64 y.o.  Admit date: 05/08/2015 Discharge date: 05/09/2015  Admission Diagnoses:uterine prolapse, cystocele  Discharge Diagnoses: same Active Problems:   Prolapse of female bladder, acquired   Discharged Condition: good  Hospital Course: adm for LAVH + BSO and ant repair, on POD 1, afeb, tol PO and eady for D/C  Consults: None  Significant Diagnostic Studies: labs:  Results for orders placed or performed during the hospital encounter of 05/08/15 (from the past 24 hour(s))  CBC     Status: Abnormal   Collection Time: 05/09/15  5:07 AM  Result Value Ref Range   WBC 13.7 (H) 4.0 - 10.5 K/uL   RBC 3.77 (L) 3.87 - 5.11 MIL/uL   Hemoglobin 11.3 (L) 12.0 - 15.0 g/dL   HCT 35.1 (L) 36.0 - 46.0 %   MCV 93.1 78.0 - 100.0 fL   MCH 30.0 26.0 - 34.0 pg   MCHC 32.2 30.0 - 36.0 g/dL   RDW 13.8 11.5 - 15.5 %   Platelets 201 150 - 400 K/uL    Treatments: surgery: LAVH BSO/ant repair  Discharge Exam: Blood pressure 107/72, pulse 70, temperature 98.7 F (37.1 C), temperature source Oral, resp. rate 16, height 5\' 5"  (1.651 m), weight 175 lb (79.379 kg), SpO2 97 %. General appearance: alert GI: soft + BS Incision/Wound:C/D  Disposition: 01-Home or Self Care  Discharge Instructions    Discontinue Vaginal Packing    Complete by:  As directed             Medication List    STOP taking these medications        loratadine 10 MG tablet  Commonly known as:  CLARITIN      TAKE these medications        aspirin 81 MG tablet  Take 81 mg by mouth daily.     diphenhydrAMINE 25 MG tablet  Commonly known as:  BENADRYL  Take 50 mg by mouth every 6 (six) hours as needed for itching or allergies.     diphenhydramine-acetaminophen 25-500 MG Tabs  Commonly known as:  TYLENOL PM  Take 1 tablet by mouth at bedtime as needed (sleep).     Fish Oil 1000 MG Caps  Take by mouth every morning.     fluticasone 50 MCG/ACT nasal spray  Commonly known as:  FLONASE  Place 2 sprays into both nostrils as needed.     ibuprofen 800 MG tablet  Commonly known as:  ADVIL,MOTRIN  Take 1 tablet (800 mg total) by mouth every 8 (eight) hours as needed for moderate pain (mild pain).     Melatonin 3 MG Tabs  Take 2 tablets by mouth at bedtime as needed (sleep).     multivitamin capsule  Take 1 capsule by mouth daily.     oxyCODONE-acetaminophen 5-325 MG per tablet  Commonly known as:  PERCOCET/ROXICET  Take 1-2 tablets by mouth every 4 (four) hours as needed for severe pain (moderate to severe pain (when tolerating fluids)).     Vitamin D 2000 UNITS tablet  Take 2,000 Units by mouth daily.           Follow-up Information    Follow up with Margarette Asal, MD. Schedule an appointment as soon as possible for a visit in 10 days.   Specialty:  Obstetrics and Gynecology   Contact information:   [address] [place] [address] [phone]  SignedMargarette Asal 05/09/2015, 9:02 AM

## 2015-07-31 ENCOUNTER — Encounter: Payer: Self-pay | Admitting: Hematology & Oncology

## 2015-07-31 ENCOUNTER — Other Ambulatory Visit (HOSPITAL_BASED_OUTPATIENT_CLINIC_OR_DEPARTMENT_OTHER): Payer: Federal, State, Local not specified - PPO

## 2015-07-31 ENCOUNTER — Ambulatory Visit (HOSPITAL_BASED_OUTPATIENT_CLINIC_OR_DEPARTMENT_OTHER): Payer: Federal, State, Local not specified - PPO | Admitting: Hematology & Oncology

## 2015-07-31 VITALS — BP 152/97 | HR 84 | Temp 97.8°F | Resp 16 | Ht 65.0 in | Wt 176.0 lb

## 2015-07-31 DIAGNOSIS — Z853 Personal history of malignant neoplasm of breast: Secondary | ICD-10-CM

## 2015-07-31 DIAGNOSIS — E559 Vitamin D deficiency, unspecified: Secondary | ICD-10-CM

## 2015-07-31 DIAGNOSIS — M810 Age-related osteoporosis without current pathological fracture: Secondary | ICD-10-CM

## 2015-07-31 DIAGNOSIS — C50912 Malignant neoplasm of unspecified site of left female breast: Secondary | ICD-10-CM

## 2015-07-31 DIAGNOSIS — C50012 Malignant neoplasm of nipple and areola, left female breast: Secondary | ICD-10-CM

## 2015-07-31 LAB — CBC WITH DIFFERENTIAL (CANCER CENTER ONLY)
BASO#: 0 10*3/uL (ref 0.0–0.2)
BASO%: 0.5 % (ref 0.0–2.0)
EOS%: 1.5 % (ref 0.0–7.0)
Eosinophils Absolute: 0.1 10*3/uL (ref 0.0–0.5)
HCT: 40.4 % (ref 34.8–46.6)
HGB: 13.2 g/dL (ref 11.6–15.9)
LYMPH#: 1.5 10*3/uL (ref 0.9–3.3)
LYMPH%: 25.2 % (ref 14.0–48.0)
MCH: 31 pg (ref 26.0–34.0)
MCHC: 32.7 g/dL (ref 32.0–36.0)
MCV: 95 fL (ref 81–101)
MONO#: 0.6 10*3/uL (ref 0.1–0.9)
MONO%: 9.1 % (ref 0.0–13.0)
NEUT#: 3.9 10*3/uL (ref 1.5–6.5)
NEUT%: 63.7 % (ref 39.6–80.0)
PLATELETS: 255 10*3/uL (ref 145–400)
RBC: 4.26 10*6/uL (ref 3.70–5.32)
RDW: 13.5 % (ref 11.1–15.7)
WBC: 6.1 10*3/uL (ref 3.9–10.0)

## 2015-07-31 LAB — COMPREHENSIVE METABOLIC PANEL (CC13)
ALT: 14 U/L (ref 0–55)
AST: 22 U/L (ref 5–34)
Albumin: 3.9 g/dL (ref 3.5–5.0)
Alkaline Phosphatase: 67 U/L (ref 40–150)
Anion Gap: 8 mEq/L (ref 3–11)
BUN: 14.9 mg/dL (ref 7.0–26.0)
CHLORIDE: 102 meq/L (ref 98–109)
CO2: 28 meq/L (ref 22–29)
Calcium: 10.3 mg/dL (ref 8.4–10.4)
Creatinine: 0.8 mg/dL (ref 0.6–1.1)
EGFR: >90 mL/min/{1.73_m2} (ref >90)
Glucose: 90 mg/dl (ref 70–140)
Potassium: 3.7 mEq/L (ref 3.5–5.1)
SODIUM: 138 meq/L (ref 136–145)
Total Bilirubin: 0.51 mg/dL (ref 0.20–1.20)
Total Protein: 7.3 g/dL (ref 6.4–8.3)

## 2015-07-31 NOTE — Progress Notes (Signed)
Hematology and Oncology Follow Up Visit  Maureen Duncan [id number] 02/01/1951 64 y.o. 07/31/2015   Principle Diagnosis:  Stage IIb (T2N1M0) ductal carcinoma of the right breast-ER negative/HER-2 positive History of DVT of the left subclavian vein  Current Therapy:    Aspirin 162 mg by mouth daily     Interim History:  Maureen Duncan is back for followup. She is doing well. She has had a good summer. She has had no problems since we last saw her.  She has been active. She has been mowing her yard.  Her appetite has been doing okay. She has had no nausea or vomiting.  She has had no change in bowel or bladder habits.  She's had no leg swelling. There's been no arm swelling. She has some lymphedema of the right arm. She wears a compression sleeve whenever she is active.  Her next mammogram is due in November.  Overall, her performance status is ECOG 0.     Medications:  Current outpatient prescriptions:  .  aspirin 81 MG tablet, Take 81 mg by mouth daily., Disp: , Rfl:  .  Cholecalciferol (VITAMIN D) 2000 UNITS tablet, Take 2,000 Units by mouth daily., Disp: , Rfl:  .  diphenhydrAMINE (BENADRYL) 25 MG tablet, Take 50 mg by mouth every 6 (six) hours as needed for itching or allergies. , Disp: , Rfl:  .  diphenhydramine-acetaminophen (TYLENOL PM) 25-500 MG TABS, Take 1 tablet by mouth at bedtime as needed (sleep). , Disp: , Rfl:  .  fluticasone (FLONASE) 50 MCG/ACT nasal spray, Place 2 sprays into both nostrils as needed., Disp: 16 g, Rfl: 2 .  ibuprofen (ADVIL,MOTRIN) 800 MG tablet, Take 1 tablet (800 mg total) by mouth every 8 (eight) hours as needed for moderate pain (mild pain)., Disp: 30 tablet, Rfl: 1 .  Melatonin 3 MG TABS, Take 2 tablets by mouth at bedtime as needed (sleep). , Disp: , Rfl:  .  Multiple Vitamin (MULTIVITAMIN) capsule, Take 1 capsule by mouth daily., Disp: , Rfl:  .  Omega-3 Fatty Acids (FISH OIL) 1000 MG CAPS, Take by mouth every morning., Disp: , Rfl:  .   oxyCODONE-acetaminophen (PERCOCET/ROXICET) 5-325 MG per tablet, Take 1-2 tablets by mouth every 4 (four) hours as needed for severe pain (moderate to severe pain (when tolerating fluids))., Disp: 30 tablet, Rfl: 0 .  [DISCONTINUED] calcium-vitamin D (OSCAL) 250-125 MG-UNIT per tablet, Take 1 tablet by mouth daily.  , Disp: , Rfl:  No current facility-administered medications for this visit.  Facility-Administered Medications Ordered in Other Visits:  .  sodium chloride 0.9 % injection 10 mL, 10 mL, Intravenous, PRN, Volanda Napoleon, MD, 10 mL at 06/17/12 1651  Allergies:  Allergies  Allergen Reactions  . Other Rash  . Adhesive [Tape] Rash    Past Medical History, Surgical history, Social history, and Family History were reviewed and updated.  Review of Systems: As above  Physical Exam:  height is 5' 5"  (1.651 m) and weight is 176 lb (79.833 kg). Her oral temperature is 97.8 F (36.6 C). Her blood pressure is 152/97 and her pulse is 84. Her respiration is 16.   Well-developed and well-nourished African-American female. Head and neck exam shows no ocular or oral lesions. She has no adenopathy in the neck. Lungs are clear. Cardiac exam regular rate and rhythm with no murmurs, rubs or bruits. We're not I'll go ahead rate was yes yes he did. Breast exam shows left breast with no masses, edema or erythema.  There is no left axillary adenopathy. Right chest wall shows well-healed mastectomy. She has some firmness a domestic restart. She's had some reddish changes. There's no nodularity. There is no right axillary adenopathy. Abdomen is soft . She has good bowel sounds. There is no fluid wave. There is no palpable liver or spleen tip. Extremities shows some slight chronic nonpitting edema of the right arm. Left arm is unremarkable. Back exam shows no tenderness over the spine, ribs or hips. Skin exam shows no rashes, ecchymosis or petechia. Neurological exam is nonfocal.  Lab Results  Component  Value Date   WBC 6.1 07/31/2015   HGB 13.2 07/31/2015   HCT 40.4 07/31/2015   MCV 95 07/31/2015   PLT 255 07/31/2015     Chemistry      Component Value Date/Time   NA 143 01/30/2015 0947   NA 137 03/30/2014 1146   K 4.1 01/30/2015 0947   K 3.9 03/30/2014 1146   CL 102 01/30/2015 0947   CL 100 03/30/2014 1146   CO2 27 01/30/2015 0947   CO2 26 03/30/2014 1146   BUN 13 01/30/2015 0947   BUN 19 03/30/2014 1146   CREATININE 1.1 01/30/2015 0947   CREATININE 0.82 03/30/2014 1146      Component Value Date/Time   CALCIUM 9.5 01/30/2015 0947   CALCIUM 10.2 03/30/2014 1146   ALKPHOS 61 01/30/2015 0947   ALKPHOS 50 03/30/2014 1146   AST 28 01/30/2015 0947   AST 20 03/30/2014 1146   ALT 19 01/30/2015 0947   ALT 12 03/30/2014 1146   BILITOT 0.80 01/30/2015 0947   BILITOT 0.5 03/30/2014 1146         Impression and Plan: Maureen Duncan is 64 year old African female. She has a history of stage IIb ductal carcinoma of the right breast. Her tumor was HER-2 positive. She was treated with systemic chemotherapy followed by a year of Herceptin. She completed this in August of 2014.  Of note, she had 3 positive lymph nodes. She did receive 6 cycles of chemotherapy with Taxotere and carboplatinum.  She has had no problems with respect to heart failure. I don't think we need to do any type of cardiac test on her. Her last MUGA scan was done back in May 2015. She had a ejection fraction of 54%.  We'll plan to see her back in 6 months. Volanda Napoleon, MD 10/3/201612:02 PM

## 2015-08-01 ENCOUNTER — Telehealth: Payer: Self-pay | Admitting: *Deleted

## 2015-08-01 LAB — VITAMIN D 25 HYDROXY (VIT D DEFICIENCY, FRACTURES): VIT D 25 HYDROXY: 19 ng/mL — AB (ref 30–100)

## 2015-08-01 MED ORDER — VITAMIN D (ERGOCALCIFEROL) 1.25 MG (50000 UNIT) PO CAPS: 50000.0000 [IU] | ORAL_CAPSULE | ORAL | Status: DC

## 2015-08-01 NOTE — Addendum Note (Signed)
Addended by: Burney Gauze R on: 08/01/2015 07:08 AM   Modules accepted: Orders, Medications

## 2015-08-01 NOTE — Telephone Encounter (Addendum)
Patient aware of results. Patient will pick up prescription.   ----- Message from Volanda Napoleon, MD sent at 08/01/2015  7:06 AM EDT ----- Call - vit D is very low at 19!! Need the 50000 units a week.  I will send this to her pharmacy.  Laurey Arrow

## 2016-01-29 ENCOUNTER — Other Ambulatory Visit (HOSPITAL_BASED_OUTPATIENT_CLINIC_OR_DEPARTMENT_OTHER): Payer: Federal, State, Local not specified - PPO

## 2016-01-29 ENCOUNTER — Encounter: Payer: Self-pay | Admitting: Hematology & Oncology

## 2016-01-29 ENCOUNTER — Ambulatory Visit (HOSPITAL_BASED_OUTPATIENT_CLINIC_OR_DEPARTMENT_OTHER): Payer: Federal, State, Local not specified - PPO | Admitting: Hematology & Oncology

## 2016-01-29 VITALS — BP 135/89 | HR 89 | Temp 98.1°F | Resp 18 | Ht 65.0 in | Wt 176.0 lb

## 2016-01-29 DIAGNOSIS — C50012 Malignant neoplasm of nipple and areola, left female breast: Secondary | ICD-10-CM

## 2016-01-29 DIAGNOSIS — E559 Vitamin D deficiency, unspecified: Secondary | ICD-10-CM | POA: Diagnosis not present

## 2016-01-29 DIAGNOSIS — C50911 Malignant neoplasm of unspecified site of right female breast: Secondary | ICD-10-CM | POA: Diagnosis not present

## 2016-01-29 DIAGNOSIS — Z7982 Long term (current) use of aspirin: Secondary | ICD-10-CM | POA: Diagnosis not present

## 2016-01-29 DIAGNOSIS — I82B12 Acute embolism and thrombosis of left subclavian vein: Secondary | ICD-10-CM

## 2016-01-29 LAB — CMP (CANCER CENTER ONLY)
ALT(SGPT): 23 U/L (ref 10–47)
AST: 28 U/L (ref 11–38)
Albumin: 3.7 g/dL (ref 3.3–5.5)
Alkaline Phosphatase: 60 U/L (ref 26–84)
BUN, Bld: 16 mg/dL (ref 7–22)
CALCIUM: 10.1 mg/dL (ref 8.0–10.3)
CHLORIDE: 102 meq/L (ref 98–108)
CO2: 29 meq/L (ref 18–33)
Creat: 0.8 mg/dl (ref 0.6–1.2)
GLUCOSE: 104 mg/dL (ref 73–118)
POTASSIUM: 4.3 meq/L (ref 3.3–4.7)
Sodium: 138 mEq/L (ref 128–145)
Total Bilirubin: 0.7 mg/dl (ref 0.20–1.60)
Total Protein: 7.6 g/dL (ref 6.4–8.1)

## 2016-01-29 LAB — CBC WITH DIFFERENTIAL (CANCER CENTER ONLY)
BASO#: 0 10*3/uL (ref 0.0–0.2)
BASO%: 0.3 % (ref 0.0–2.0)
EOS%: 2.6 % (ref 0.0–7.0)
Eosinophils Absolute: 0.2 10*3/uL (ref 0.0–0.5)
HEMATOCRIT: 40.9 % (ref 34.8–46.6)
HGB: 13.4 g/dL (ref 11.6–15.9)
LYMPH#: 1.7 10*3/uL (ref 0.9–3.3)
LYMPH%: 24.8 % (ref 14.0–48.0)
MCH: 30.9 pg (ref 26.0–34.0)
MCHC: 32.8 g/dL (ref 32.0–36.0)
MCV: 95 fL (ref 81–101)
MONO#: 0.6 10*3/uL (ref 0.1–0.9)
MONO%: 8.6 % (ref 0.0–13.0)
NEUT#: 4.5 10*3/uL (ref 1.5–6.5)
NEUT%: 63.7 % (ref 39.6–80.0)
Platelets: 239 10*3/uL (ref 145–400)
RBC: 4.33 10*6/uL (ref 3.70–5.32)
RDW: 13.3 % (ref 11.1–15.7)
WBC: 7 10*3/uL (ref 3.9–10.0)

## 2016-01-29 NOTE — Progress Notes (Signed)
Hematology and Oncology Follow Up Visit  Maureen Duncan [id number] 05/15/1951 64 y.o. 01/29/2016   Principle Diagnosis:  Stage IIb (T2N1M0) ductal carcinoma of the right breast-ER negative/HER-2 positive History of DVT of the left subclavian vein  Current Therapy:    Aspirin 162 mg by mouth daily     Interim History:  Ms.  Duncan is back for followup. She is doing well. She has had a good holiday season and winter.. She has had no problems since we last saw her.  She has been active. She has been mowing her yard. She signs show her grandson how to boatyard.  She has a new family doctor. Apparently she was put through a lot of cardiac tests.. I am not sure as to exactly what the problem was.  She had an EKG done which showed a left branch block. Then, she had additional studies done. She had a stress test done. She said everything turned out okay.  I will have to make sure she gets a mammogram done this year.  She has had no problems with her bowels or bladder. She has had no cough. She's had no fever. She has had no I'll with infections over the past several months.  Overall, her performance status is ECOG 0.     Medications:  Current outpatient prescriptions:  .  acetaminophen (TYLENOL) 500 MG tablet, Take by mouth., Disp: , Rfl:  .  aspirin 81 MG tablet, Take 81 mg by mouth daily., Disp: , Rfl:  .  atorvastatin (LIPITOR) 40 MG tablet, Take 40 mg by mouth daily., Disp: , Rfl: 0 .  Cholecalciferol (VITAMIN D3) 1000 units CAPS, Take by mouth., Disp: , Rfl:  .  diphenhydrAMINE (BENADRYL) 25 MG tablet, Take 50 mg by mouth every 6 (six) hours as needed for itching or allergies. , Disp: , Rfl:  .  diphenhydrAMINE (SOMINEX) 25 MG tablet, Take by mouth., Disp: , Rfl:  .  diphenhydramine-acetaminophen (TYLENOL PM) 25-500 MG TABS, Take 1 tablet by mouth at bedtime as needed (sleep). , Disp: , Rfl:  .  fluticasone (FLONASE) 50 MCG/ACT nasal spray, Place 2 sprays into both nostrils as  needed., Disp: 16 g, Rfl: 2 .  ibuprofen (ADVIL,MOTRIN) 800 MG tablet, Take 1 tablet (800 mg total) by mouth every 8 (eight) hours as needed for moderate pain (mild pain)., Disp: 30 tablet, Rfl: 1 .  loratadine (CLARITIN) 10 MG tablet, Take by mouth., Disp: , Rfl:  .  Melatonin 3 MG TABS, Take 2 tablets by mouth at bedtime as needed (sleep). , Disp: , Rfl:  .  Multiple Vitamin (MULTIVITAMIN) capsule, Take 1 capsule by mouth daily., Disp: , Rfl:  .  Omega-3 Fatty Acids (FISH OIL) 1000 MG CAPS, Take by mouth every morning., Disp: , Rfl:  .  oxyCODONE-acetaminophen (PERCOCET/ROXICET) 5-325 MG per tablet, Take 1-2 tablets by mouth every 4 (four) hours as needed for severe pain (moderate to severe pain (when tolerating fluids))., Disp: 30 tablet, Rfl: 0 .  RESTASIS 0.05 % ophthalmic emulsion, PLACE 1 DROP INTO BOTH EYES TWICE A DAY, Disp: , Rfl: 3 .  Vitamin D, Ergocalciferol, (DRISDOL) 50000 UNITS CAPS capsule, Take 1 capsule (50,000 Units total) by mouth every 7 (seven) days., Disp: 20 capsule, Rfl: 8 .  [DISCONTINUED] calcium-vitamin D (OSCAL) 250-125 MG-UNIT per tablet, Take 1 tablet by mouth daily.  , Disp: , Rfl:  No current facility-administered medications for this visit.  Facility-Administered Medications Ordered in Other Visits:  .  sodium chloride   0.9 % injection 10 mL, 10 mL, Intravenous, PRN, Volanda Napoleon, MD, 10 mL at 06/17/12 1651  Allergies:  Allergies  Allergen Reactions  . Other Rash  . Adhesive [Tape] Rash    Past Medical History, Surgical history, Social history, and Family History were reviewed and updated.  Review of Systems: As above  Physical Exam:  height is 5' 5" (1.651 m) and weight is 176 lb (79.833 kg). Her oral temperature is 98.1 F (36.7 C). Her blood pressure is 135/89 and her pulse is 89. Her respiration is 18.   Well-developed and well-nourished African-American female. Head and neck exam shows no ocular or oral lesions. She has no adenopathy in the  neck. Lungs are clear. Cardiac exam regular rate and rhythm with no murmurs, rubs or bruits. We're not I'll go ahead rate was yes yes he did. Breast exam shows left breast with no masses, edema or erythema. There is no left axillary adenopathy. Right chest wall shows well-healed mastectomy. She has some firmness a domestic restart. She's had some reddish changes. There's no nodularity. There is no right axillary adenopathy. Abdomen is soft . She has good bowel sounds. There is no fluid wave. There is no palpable liver or spleen tip. Extremities shows some slight chronic nonpitting edema of the right arm. Left arm is unremarkable. Back exam shows no tenderness over the spine, ribs or hips. Skin exam shows no rashes, ecchymosis or petechia. Neurological exam is nonfocal.  Lab Results  Component Value Date   WBC 7.0 01/29/2016   HGB 13.4 01/29/2016   HCT 40.9 01/29/2016   MCV 95 01/29/2016   PLT 239 01/29/2016     Chemistry      Component Value Date/Time   NA 138 01/29/2016 0821   NA 138 07/31/2015 1015   NA 137 03/30/2014 1146   K 4.3 01/29/2016 0821   K 3.7 07/31/2015 1015   K 3.9 03/30/2014 1146   CL 102 01/29/2016 0821   CL 100 03/30/2014 1146   CO2 29 01/29/2016 0821   CO2 28 07/31/2015 1015   CO2 26 03/30/2014 1146   BUN 16 01/29/2016 0821   BUN 14.9 07/31/2015 1015   BUN 19 03/30/2014 1146   CREATININE 0.8 01/29/2016 0821   CREATININE 0.8 07/31/2015 1015   CREATININE 0.82 03/30/2014 1146      Component Value Date/Time   CALCIUM 10.1 01/29/2016 0821   CALCIUM 10.3 07/31/2015 1015   CALCIUM 10.2 03/30/2014 1146   ALKPHOS 60 01/29/2016 0821   ALKPHOS 67 07/31/2015 1015   ALKPHOS 50 03/30/2014 1146   AST 28 01/29/2016 0821   AST 22 07/31/2015 1015   AST 20 03/30/2014 1146   ALT 23 01/29/2016 0821   ALT 14 07/31/2015 1015   ALT 12 03/30/2014 1146   BILITOT 0.70 01/29/2016 0821   BILITOT 0.51 07/31/2015 1015   BILITOT 0.5 03/30/2014 1146         Impression and  Plan: Maureen Duncan is 65 year old African female. She has a history of stage IIb ductal carcinoma of the right breast. Her tumor was HER-2 positive. She was treated with systemic chemotherapy followed by a year of Herceptin. She completed this in August of 2014.  Of note, she had 3 positive lymph nodes. She did receive 6 cycles of chemotherapy with Taxotere and carboplatinum.  She has had no problems with respect to heart failure. I don't think we need to do any type of cardiac test on her. Her last MUGA scan  was done back in May 2015. She had a ejection fraction of 54%.  We'll plan to see her back in 6 months. Volanda Napoleon, MD 4/3/20179:10 AM

## 2016-01-30 ENCOUNTER — Telehealth: Payer: Self-pay | Admitting: *Deleted

## 2016-01-30 LAB — VITAMIN D 25 HYDROXY (VIT D DEFICIENCY, FRACTURES): Vitamin D, 25-Hydroxy: 34.7 ng/mL (ref 30.0–100.0)

## 2016-01-30 NOTE — Telephone Encounter (Addendum)
Patient aware of results  ----- Message from Volanda Napoleon, MD sent at 01/30/2016  6:58 AM EDT ----- Call - vit D level is doing ok!!!  pete

## 2016-01-31 ENCOUNTER — Other Ambulatory Visit: Payer: Self-pay | Admitting: *Deleted

## 2016-01-31 MED ORDER — FLUTICASONE PROPIONATE 50 MCG/ACT NA SUSP: 2.0000 | NASAL | Status: DC | PRN

## 2016-02-02 ENCOUNTER — Encounter: Payer: Self-pay | Admitting: Hematology & Oncology

## 2016-02-06 DIAGNOSIS — M8580 Other specified disorders of bone density and structure, unspecified site: Secondary | ICD-10-CM | POA: Diagnosis not present

## 2016-02-06 DIAGNOSIS — E559 Vitamin D deficiency, unspecified: Secondary | ICD-10-CM | POA: Diagnosis not present

## 2016-02-06 DIAGNOSIS — E7800 Pure hypercholesterolemia, unspecified: Secondary | ICD-10-CM | POA: Diagnosis not present

## 2016-02-16 ENCOUNTER — Encounter: Payer: Self-pay | Admitting: Hematology & Oncology

## 2016-04-01 DIAGNOSIS — K080 Exfoliation of teeth due to systemic causes: Secondary | ICD-10-CM | POA: Diagnosis not present

## 2016-04-25 DIAGNOSIS — N760 Acute vaginitis: Secondary | ICD-10-CM | POA: Diagnosis not present

## 2016-06-21 DIAGNOSIS — C50911 Malignant neoplasm of unspecified site of right female breast: Secondary | ICD-10-CM | POA: Diagnosis not present

## 2016-07-29 ENCOUNTER — Other Ambulatory Visit (HOSPITAL_BASED_OUTPATIENT_CLINIC_OR_DEPARTMENT_OTHER): Payer: Federal, State, Local not specified - PPO

## 2016-07-29 ENCOUNTER — Encounter: Payer: Self-pay | Admitting: Hematology & Oncology

## 2016-07-29 ENCOUNTER — Ambulatory Visit (HOSPITAL_BASED_OUTPATIENT_CLINIC_OR_DEPARTMENT_OTHER): Payer: Federal, State, Local not specified - PPO | Admitting: Hematology & Oncology

## 2016-07-29 VITALS — BP 153/88 | HR 80 | Temp 97.6°F | Resp 18 | Ht 65.0 in | Wt 182.8 lb

## 2016-07-29 DIAGNOSIS — I509 Heart failure, unspecified: Secondary | ICD-10-CM | POA: Diagnosis not present

## 2016-07-29 DIAGNOSIS — Z7982 Long term (current) use of aspirin: Secondary | ICD-10-CM

## 2016-07-29 DIAGNOSIS — E559 Vitamin D deficiency, unspecified: Secondary | ICD-10-CM | POA: Diagnosis not present

## 2016-07-29 DIAGNOSIS — Z853 Personal history of malignant neoplasm of breast: Secondary | ICD-10-CM | POA: Diagnosis not present

## 2016-07-29 DIAGNOSIS — C50911 Malignant neoplasm of unspecified site of right female breast: Secondary | ICD-10-CM | POA: Diagnosis not present

## 2016-07-29 DIAGNOSIS — Z86718 Personal history of other venous thrombosis and embolism: Secondary | ICD-10-CM

## 2016-07-29 DIAGNOSIS — C50011 Malignant neoplasm of nipple and areola, right female breast: Secondary | ICD-10-CM

## 2016-07-29 DIAGNOSIS — T386X5A Adverse effect of antigonadotrophins, antiestrogens, antiandrogens, not elsewhere classified, initial encounter: Secondary | ICD-10-CM

## 2016-07-29 DIAGNOSIS — M818 Other osteoporosis without current pathological fracture: Secondary | ICD-10-CM

## 2016-07-29 DIAGNOSIS — C50012 Malignant neoplasm of nipple and areola, left female breast: Secondary | ICD-10-CM

## 2016-07-29 LAB — CBC WITH DIFFERENTIAL (CANCER CENTER ONLY)
BASO#: 0 10*3/uL (ref 0.0–0.2)
BASO%: 0.3 % (ref 0.0–2.0)
EOS%: 1.3 % (ref 0.0–7.0)
Eosinophils Absolute: 0.1 10*3/uL (ref 0.0–0.5)
HEMATOCRIT: 39.6 % (ref 34.8–46.6)
HEMOGLOBIN: 13.3 g/dL (ref 11.6–15.9)
LYMPH#: 1.4 10*3/uL (ref 0.9–3.3)
LYMPH%: 22.3 % (ref 14.0–48.0)
MCH: 31.5 pg (ref 26.0–34.0)
MCHC: 33.6 g/dL (ref 32.0–36.0)
MCV: 94 fL (ref 81–101)
MONO#: 0.5 10*3/uL (ref 0.1–0.9)
MONO%: 8.4 % (ref 0.0–13.0)
NEUT%: 67.7 % (ref 39.6–80.0)
NEUTROS ABS: 4.3 10*3/uL (ref 1.5–6.5)
Platelets: 256 10*3/uL (ref 145–400)
RBC: 4.22 10*6/uL (ref 3.70–5.32)
RDW: 12.7 % (ref 11.1–15.7)
WBC: 6.3 10*3/uL (ref 3.9–10.0)

## 2016-07-29 LAB — CMP (CANCER CENTER ONLY)
ALBUMIN: 3.6 g/dL (ref 3.3–5.5)
ALK PHOS: 70 U/L (ref 26–84)
ALT: 16 U/L (ref 10–47)
AST: 24 U/L (ref 11–38)
BILIRUBIN TOTAL: 0.8 mg/dL (ref 0.20–1.60)
BUN, Bld: 13 mg/dL (ref 7–22)
CALCIUM: 9.4 mg/dL (ref 8.0–10.3)
CO2: 29 meq/L (ref 18–33)
CREATININE: 0.8 mg/dL (ref 0.6–1.2)
Chloride: 104 mEq/L (ref 98–108)
Glucose, Bld: 96 mg/dL (ref 73–118)
Potassium: 3.4 mEq/L (ref 3.3–4.7)
Sodium: 139 mEq/L (ref 128–145)
TOTAL PROTEIN: 7.5 g/dL (ref 6.4–8.1)

## 2016-07-29 NOTE — Progress Notes (Signed)
Hematology and Oncology Follow Up Visit  Maureen Duncan [id number] 15-May-1951 65 y.o. 07/29/2016   Principle Diagnosis:  Stage IIb (T2N1M0) ductal carcinoma of the right breast-ER negative/HER-2 positive History of DVT of the left subclavian vein  Current Therapy:    Aspirin 162 mg by mouth daily     Interim History:  Ms.  Duncan is back for followup. She is doing well. She has had a good summer. She went to [facility] with her grandson. He is now in sixth grade.  She is still working. She is enjoying work. She may work 1 more year and then retire.  Her daughter is thinking about moving to [facility] always yes and treatment. She is not too happy about this as her grandson would have to move with her and be away from her.  There's been no problems with nausea or vomiting. She has had no problems with her bowels or bladder. She's had occasional pain in the left leg. She has none right now.  She's had no leg swelling.  She's had no rashes.  Her last mammogram was in November. She will be due for another one next month.   She is taking her vitamin D. She is taking aspirin. She has had no cough. She's had no fever. She has had no I'll with infections over the past several months.  Overall, her performance status is ECOG 0.     Medications:  Current Outpatient Prescriptions:  .  acetaminophen (TYLENOL) 500 MG tablet, Take by mouth., Disp: , Rfl:  .  aspirin 81 MG tablet, Take 81 mg by mouth daily., Disp: , Rfl:  .  diphenhydrAMINE (BENADRYL) 25 MG tablet, Take 50 mg by mouth every 6 (six) hours as needed for itching or allergies. , Disp: , Rfl:  .  diphenhydramine-acetaminophen (TYLENOL PM) 25-500 MG TABS, Take 1 tablet by mouth at bedtime as needed (sleep). , Disp: , Rfl:  .  fluticasone (FLONASE) 50 MCG/ACT nasal spray, Place 2 sprays into both nostrils as needed., Disp: 16 g, Rfl: 2 .  ibuprofen (ADVIL,MOTRIN) 800 MG tablet, Take 1 tablet (800 mg total) by mouth every 8 (eight)  hours as needed for moderate pain (mild pain)., Disp: 30 tablet, Rfl: 1 .  loratadine (CLARITIN) 10 MG tablet, Take by mouth., Disp: , Rfl:  .  Melatonin 3 MG TABS, Take 2 tablets by mouth at bedtime as needed (sleep). , Disp: , Rfl:  .  Multiple Vitamin (MULTIVITAMIN) capsule, Take 1 capsule by mouth daily., Disp: , Rfl:  .  Omega-3 Fatty Acids (FISH OIL) 1000 MG CAPS, Take by mouth every morning., Disp: , Rfl:  .  RESTASIS 0.05 % ophthalmic emulsion, PLACE 1 DROP INTO BOTH EYES TWICE A DAY, Disp: , Rfl: 3 .  Vitamin D, Ergocalciferol, (DRISDOL) 50000 UNITS CAPS capsule, Take 1 capsule (50,000 Units total) by mouth every 7 (seven) days., Disp: 20 capsule, Rfl: 8 No current facility-administered medications for this visit.   Facility-Administered Medications Ordered in Other Visits:  .  sodium chloride 0.9 % injection 10 mL, 10 mL, Intravenous, PRN, Volanda Napoleon, MD, 10 mL at 06/17/12 1651  Allergies:  Allergies  Allergen Reactions  . Other Rash  . Adhesive [Tape] Rash    Past Medical History, Surgical history, Social history, and Family History were reviewed and updated.  Review of Systems: As above  Physical Exam:  height is 5' 5"  (1.651 m) and weight is 182 lb 12.8 oz (82.9 kg). Her oral temperature  is 97.6 F (36.4 C). Her blood pressure is 153/88 (abnormal) and her pulse is 80. Her respiration is 18.   Well-developed and well-nourished African-American female. Head and neck exam shows no ocular or oral lesions. She has no adenopathy in the neck. Lungs are clear. Cardiac exam regular rate and rhythm with no murmurs, rubs or bruits. We're not I'll go ahead rate was yes yes he did. Breast exam shows left breast with no masses, edema or erythema. There is no left axillary adenopathy. Right chest wall shows well-healed mastectomy. She has some firmness a domestic restart. She's had some reddish changes. There's no nodularity. There is no right axillary adenopathy. Abdomen is soft . She  has good bowel sounds. There is no fluid wave. There is no palpable liver or spleen tip. Extremities shows some slight chronic nonpitting edema of the right arm. Left arm is unremarkable. Back exam shows no tenderness over the spine, ribs or hips. Skin exam shows no rashes, ecchymosis or petechia. Neurological exam is nonfocal.  Lab Results  Component Value Date   WBC 6.3 07/29/2016   HGB 13.3 07/29/2016   HCT 39.6 07/29/2016   MCV 94 07/29/2016   PLT 256 07/29/2016     Chemistry      Component Value Date/Time   NA 138 01/29/2016 0821   NA 138 07/31/2015 1015   K 4.3 01/29/2016 0821   K 3.7 07/31/2015 1015   CL 102 01/29/2016 0821   CO2 29 01/29/2016 0821   CO2 28 07/31/2015 1015   BUN 16 01/29/2016 0821   BUN 14.9 07/31/2015 1015   CREATININE 0.8 01/29/2016 0821   CREATININE 0.8 07/31/2015 1015      Component Value Date/Time   CALCIUM 10.1 01/29/2016 0821   CALCIUM 10.3 07/31/2015 1015   ALKPHOS 60 01/29/2016 0821   ALKPHOS 67 07/31/2015 1015   AST 28 01/29/2016 0821   AST 22 07/31/2015 1015   ALT 23 01/29/2016 0821   ALT 14 07/31/2015 1015   BILITOT 0.70 01/29/2016 0821   BILITOT 0.51 07/31/2015 1015         Impression and Plan: Maureen Duncan is 65 year old African female. She has a history of stage IIb ductal carcinoma of the right breast. Her tumor was HER-2 positive. She was treated with systemic chemotherapy followed by a year of Herceptin. She completed this in August of 2014.  Of note, she had 3 positive lymph nodes. She did receive 6 cycles of chemotherapy with Taxotere and carboplatinum.  She has had no problems with respect to heart failure. I don't think we need to do any type of cardiac test on her. Her last MUGA scan was done back in May 2015. She had a ejection fraction of 54%.  We'll plan to see her back in 6 months.  Volanda Napoleon, MD 10/2/20179:07 AM

## 2016-07-30 ENCOUNTER — Telehealth: Payer: Self-pay | Admitting: *Deleted

## 2016-07-30 LAB — VITAMIN D 25 HYDROXY (VIT D DEFICIENCY, FRACTURES): Vitamin D, 25-Hydroxy: 31.7 ng/mL (ref 30.0–100.0)

## 2016-07-30 NOTE — Telephone Encounter (Addendum)
Patient aware of results  ----- Message from Volanda Napoleon, MD sent at 07/30/2016  7:15 AM EDT ----- Call - your vit D level is very good!!  Maureen Duncan

## 2016-08-28 ENCOUNTER — Other Ambulatory Visit: Payer: Self-pay | Admitting: Hematology & Oncology

## 2016-08-28 DIAGNOSIS — E559 Vitamin D deficiency, unspecified: Secondary | ICD-10-CM

## 2016-10-25 DIAGNOSIS — Z1231 Encounter for screening mammogram for malignant neoplasm of breast: Secondary | ICD-10-CM | POA: Diagnosis not present

## 2016-10-25 DIAGNOSIS — Z853 Personal history of malignant neoplasm of breast: Secondary | ICD-10-CM | POA: Diagnosis not present

## 2016-10-31 ENCOUNTER — Encounter: Payer: Self-pay | Admitting: Hematology & Oncology

## 2016-11-06 ENCOUNTER — Other Ambulatory Visit: Payer: Self-pay | Admitting: *Deleted

## 2016-11-06 DIAGNOSIS — Z01419 Encounter for gynecological examination (general) (routine) without abnormal findings: Secondary | ICD-10-CM | POA: Diagnosis not present

## 2016-11-06 DIAGNOSIS — Z6829 Body mass index (BMI) 29.0-29.9, adult: Secondary | ICD-10-CM | POA: Diagnosis not present

## 2016-11-06 MED ORDER — FLUTICASONE PROPIONATE 50 MCG/ACT NA SUSP: 2.0000 | NASAL | 2 refills | Status: DC | PRN

## 2017-01-20 ENCOUNTER — Other Ambulatory Visit: Payer: Self-pay | Admitting: Hematology & Oncology

## 2017-01-27 ENCOUNTER — Ambulatory Visit (HOSPITAL_BASED_OUTPATIENT_CLINIC_OR_DEPARTMENT_OTHER): Payer: Federal, State, Local not specified - PPO | Admitting: Hematology & Oncology

## 2017-01-27 ENCOUNTER — Other Ambulatory Visit (HOSPITAL_BASED_OUTPATIENT_CLINIC_OR_DEPARTMENT_OTHER): Payer: Federal, State, Local not specified - PPO

## 2017-01-27 VITALS — BP 150/98 | HR 73 | Temp 98.3°F | Resp 18 | Wt 182.8 lb

## 2017-01-27 DIAGNOSIS — M818 Other osteoporosis without current pathological fracture: Secondary | ICD-10-CM

## 2017-01-27 DIAGNOSIS — C50011 Malignant neoplasm of nipple and areola, right female breast: Secondary | ICD-10-CM

## 2017-01-27 DIAGNOSIS — Z853 Personal history of malignant neoplasm of breast: Secondary | ICD-10-CM

## 2017-01-27 DIAGNOSIS — T386X5A Adverse effect of antigonadotrophins, antiestrogens, antiandrogens, not elsewhere classified, initial encounter: Secondary | ICD-10-CM

## 2017-01-27 LAB — CBC WITH DIFFERENTIAL (CANCER CENTER ONLY)
BASO#: 0 10*3/uL (ref 0.0–0.2)
BASO%: 0.4 % (ref 0.0–2.0)
EOS%: 1.6 % (ref 0.0–7.0)
Eosinophils Absolute: 0.1 10*3/uL (ref 0.0–0.5)
HEMATOCRIT: 40.9 % (ref 34.8–46.6)
HGB: 13.5 g/dL (ref 11.6–15.9)
LYMPH#: 1.7 10*3/uL (ref 0.9–3.3)
LYMPH%: 25 % (ref 14.0–48.0)
MCH: 31.4 pg (ref 26.0–34.0)
MCHC: 33 g/dL (ref 32.0–36.0)
MCV: 95 fL (ref 81–101)
MONO#: 0.5 10*3/uL (ref 0.1–0.9)
MONO%: 8 % (ref 0.0–13.0)
NEUT#: 4.4 10*3/uL (ref 1.5–6.5)
NEUT%: 65 % (ref 39.6–80.0)
Platelets: 255 10*3/uL (ref 145–400)
RBC: 4.3 10*6/uL (ref 3.70–5.32)
RDW: 12.9 % (ref 11.1–15.7)
WBC: 6.8 10*3/uL (ref 3.9–10.0)

## 2017-01-27 LAB — COMPREHENSIVE METABOLIC PANEL
ALT: 16 U/L (ref 0–55)
ANION GAP: 8 meq/L (ref 3–11)
AST: 20 U/L (ref 5–34)
Albumin: 3.8 g/dL (ref 3.5–5.0)
Alkaline Phosphatase: 67 U/L (ref 40–150)
BUN: 16.9 mg/dL (ref 7.0–26.0)
CALCIUM: 10.5 mg/dL — AB (ref 8.4–10.4)
CO2: 29 meq/L (ref 22–29)
Chloride: 105 mEq/L (ref 98–109)
Creatinine: 0.8 mg/dL (ref 0.6–1.1)
EGFR: 88 mL/min/{1.73_m2} — ABNORMAL LOW (ref >90)
Glucose: 96 mg/dl (ref 70–140)
Potassium: 4.3 mEq/L (ref 3.5–5.1)
Sodium: 142 mEq/L (ref 136–145)
Total Bilirubin: 0.53 mg/dL (ref 0.20–1.20)
Total Protein: 7.2 g/dL (ref 6.4–8.3)

## 2017-01-27 NOTE — Progress Notes (Signed)
Hematology and Oncology Follow Up Visit  Maureen Duncan [id number] 1951-06-13 66 y.o. 01/27/2017   Principle Diagnosis:  Stage IIb (T2N1M0) ductal carcinoma of the right breast-ER negative/HER-2 positive History of DVT of the left subclavian vein  Current Therapy:    Aspirin 162 mg by mouth daily     Interim History:  Maureen Duncan is back for followup. She is doing well. She has had a nice Easter. She is with her grandson. She actually is going to teach him how to mow the yard today.   She is going to [facility] sometime this summer. She is taking her grandson. She has a son who lives there. I think this would be very exciting for her and her grandson.   Her daughter is thinking about moving to [place]. I. She is not too happy about this as her grandson would have to move with her and be away from her.  There's been no problems with nausea or vomiting. She has had no problems with her bowels or bladder. She's had occasional pain in the left leg. She has none right now.  She's had no leg swelling.  She's had no rashes.  Her last mammogram was in November. She will be due for another one next month.   She is taking her vitamin D. She is taking aspirin. She has had no cough. She's had no fever. She has had no I'll with infections over the past several months.  Overall, her performance status is ECOG 0.     Medications:  Current Outpatient Prescriptions:  .  acetaminophen (TYLENOL) 500 MG tablet, Take by mouth., Disp: , Rfl:  .  aspirin 81 MG tablet, Take 81 mg by mouth daily., Disp: , Rfl:  .  diphenhydrAMINE (BENADRYL) 25 MG tablet, Take 50 mg by mouth every 6 (six) hours as needed for itching or allergies. , Disp: , Rfl:  .  diphenhydramine-acetaminophen (TYLENOL PM) 25-500 MG TABS, Take 1 tablet by mouth at bedtime as needed (sleep). , Disp: , Rfl:  .  fluticasone (FLONASE) 50 MCG/ACT nasal spray, PLACE 2 SPRAYS INTO BOTH NOSTRILS AS NEEDED., Disp: 16 g, Rfl: 4 .  loratadine  (CLARITIN) 10 MG tablet, Take by mouth., Disp: , Rfl:  .  Melatonin 3 MG TABS, Take 2 tablets by mouth at bedtime as needed (sleep). , Disp: , Rfl:  .  Multiple Vitamin (MULTIVITAMIN) capsule, Take 1 capsule by mouth daily., Disp: , Rfl:  .  RESTASIS 0.05 % ophthalmic emulsion, PLACE 1 DROP INTO BOTH EYES TWICE A DAY, Disp: , Rfl: 3 .  Vitamin D, Ergocalciferol, (DRISDOL) 50000 units CAPS capsule, TAKE 1 CAPSULE (50,000 UNITS TOTAL) BY MOUTH EVERY 7 (SEVEN) DAYS., Disp: 20 capsule, Rfl: 3 No current facility-administered medications for this visit.   Facility-Administered Medications Ordered in Other Visits:  .  sodium chloride 0.9 % injection 10 mL, 10 mL, Intravenous, PRN, Volanda Napoleon, MD, 10 mL at 06/17/12 1651  Allergies:  Allergies  Allergen Reactions  . Other Rash  . Adhesive [Tape] Rash    Past Medical History, Surgical history, Social history, and Family History were reviewed and updated.  Review of Systems: As above  Physical Exam:  weight is 182 lb 12.8 oz (82.9 kg). Her oral temperature is 98.3 F (36.8 C). Her blood pressure is 150/98 (abnormal) and her pulse is 73. Her respiration is 18 and oxygen saturation is 98%.   Well-developed and well-nourished African-American female. Head and neck exam shows no  ocular or oral lesions. She has no adenopathy in the neck. Lungs are clear. Cardiac exam regular rate and rhythm with no murmurs, rubs or bruits. We're not I'll go ahead rate was yes yes he did. Breast exam shows left breast with no masses, edema or erythema. There is no left axillary adenopathy. Right chest wall shows well-healed mastectomy. She has some firmness a domestic restart. She's had some reddish changes. There's no nodularity. There is no right axillary adenopathy. Abdomen is soft . She has good bowel sounds. There is no fluid wave. There is no palpable liver or spleen tip. Extremities shows some slight chronic nonpitting edema of the right arm. Left arm is  unremarkable. Back exam shows no tenderness over the spine, ribs or hips. Skin exam shows no rashes, ecchymosis or petechia. Neurological exam is nonfocal.  Lab Results  Component Value Date   WBC 6.8 01/27/2017   HGB 13.5 01/27/2017   HCT 40.9 01/27/2017   MCV 95 01/27/2017   PLT 255 01/27/2017     Chemistry      Component Value Date/Time   NA 139 07/29/2016 0844   NA 138 07/31/2015 1015   K 3.4 07/29/2016 0844   K 3.7 07/31/2015 1015   CL 104 07/29/2016 0844   CO2 29 07/29/2016 0844   CO2 28 07/31/2015 1015   BUN 13 07/29/2016 0844   BUN 14.9 07/31/2015 1015   CREATININE 0.8 07/29/2016 0844   CREATININE 0.8 07/31/2015 1015      Component Value Date/Time   CALCIUM 9.4 07/29/2016 0844   CALCIUM 10.3 07/31/2015 1015   ALKPHOS 70 07/29/2016 0844   ALKPHOS 67 07/31/2015 1015   AST 24 07/29/2016 0844   AST 22 07/31/2015 1015   ALT 16 07/29/2016 0844   ALT 14 07/31/2015 1015   BILITOT 0.80 07/29/2016 0844   BILITOT 0.51 07/31/2015 1015         Impression and Plan: Maureen Duncan is 66 year old African female. She has a history of stage IIb ductal carcinoma of the right breast. Her tumor was HER-2 positive. She was treated with systemic chemotherapy followed by a year of Herceptin. She completed this in August of 2014.  Of note, she had 3 positive lymph nodes. She did receive 6 cycles of chemotherapy with Taxotere and carboplatinum.  Review really looks good. I do not see any issues with respect to recurrent disease. He'll be had for years in August that she completed her treatments.  She did have the 3 positive lymph nodes so we do not to be careful with recurrence.  She has had no problems with respect to heart failure. I don't think we need to do any type of cardiac test on her. Her last MUGA scan was done back in May 2015. She had a ejection fraction of 54%.  We'll plan to see her back in 6 months.  Volanda Napoleon, MD 4/2/20189:50 AM

## 2017-01-28 ENCOUNTER — Telehealth: Payer: Self-pay

## 2017-01-28 LAB — VITAMIN D 25 HYDROXY (VIT D DEFICIENCY, FRACTURES): Vitamin D, 25-Hydroxy: 37.2 ng/mL (ref 30.0–100.0)

## 2017-01-28 NOTE — Telephone Encounter (Addendum)
-----   Message from Volanda Napoleon, MD sent at 01/28/2017  6:53 AM EDT ----- Call - VIt D level is ok!!!  Laurey Arrow  Above message given to pt via phone. Verbalizes appreciation. dph

## 2017-02-13 DIAGNOSIS — E559 Vitamin D deficiency, unspecified: Secondary | ICD-10-CM | POA: Diagnosis not present

## 2017-02-13 DIAGNOSIS — Z0000 Encounter for general adult medical examination without abnormal findings: Secondary | ICD-10-CM | POA: Diagnosis not present

## 2017-02-20 DIAGNOSIS — E669 Obesity, unspecified: Secondary | ICD-10-CM | POA: Diagnosis not present

## 2017-02-20 DIAGNOSIS — Z0000 Encounter for general adult medical examination without abnormal findings: Secondary | ICD-10-CM | POA: Diagnosis not present

## 2017-02-20 DIAGNOSIS — Z8249 Family history of ischemic heart disease and other diseases of the circulatory system: Secondary | ICD-10-CM | POA: Diagnosis not present

## 2017-02-20 DIAGNOSIS — E7800 Pure hypercholesterolemia, unspecified: Secondary | ICD-10-CM | POA: Diagnosis not present

## 2017-02-21 ENCOUNTER — Encounter: Payer: Self-pay | Admitting: Hematology & Oncology

## 2017-04-09 DIAGNOSIS — L720 Epidermal cyst: Secondary | ICD-10-CM | POA: Diagnosis not present

## 2017-07-28 ENCOUNTER — Ambulatory Visit (HOSPITAL_BASED_OUTPATIENT_CLINIC_OR_DEPARTMENT_OTHER): Payer: Federal, State, Local not specified - PPO | Admitting: Hematology & Oncology

## 2017-07-28 ENCOUNTER — Other Ambulatory Visit (HOSPITAL_BASED_OUTPATIENT_CLINIC_OR_DEPARTMENT_OTHER): Payer: Federal, State, Local not specified - PPO

## 2017-07-28 VITALS — BP 153/91 | HR 83 | Temp 98.4°F | Resp 16 | Wt 183.0 lb

## 2017-07-28 DIAGNOSIS — C50011 Malignant neoplasm of nipple and areola, right female breast: Secondary | ICD-10-CM

## 2017-07-28 DIAGNOSIS — Z9221 Personal history of antineoplastic chemotherapy: Secondary | ICD-10-CM | POA: Diagnosis not present

## 2017-07-28 DIAGNOSIS — Z171 Estrogen receptor negative status [ER-]: Secondary | ICD-10-CM

## 2017-07-28 DIAGNOSIS — Z853 Personal history of malignant neoplasm of breast: Secondary | ICD-10-CM

## 2017-07-28 DIAGNOSIS — I10 Essential (primary) hypertension: Secondary | ICD-10-CM

## 2017-07-28 DIAGNOSIS — E559 Vitamin D deficiency, unspecified: Secondary | ICD-10-CM

## 2017-07-28 LAB — CBC WITH DIFFERENTIAL (CANCER CENTER ONLY)
BASO#: 0 10*3/uL (ref 0.0–0.2)
BASO%: 0.3 % (ref 0.0–2.0)
EOS ABS: 0.1 10*3/uL (ref 0.0–0.5)
EOS%: 1.1 % (ref 0.0–7.0)
HEMATOCRIT: 41.1 % (ref 34.8–46.6)
HEMOGLOBIN: 13.7 g/dL (ref 11.6–15.9)
LYMPH#: 1.6 10*3/uL (ref 0.9–3.3)
LYMPH%: 26.4 % (ref 14.0–48.0)
MCH: 31.4 pg (ref 26.0–34.0)
MCHC: 33.3 g/dL (ref 32.0–36.0)
MCV: 94 fL (ref 81–101)
MONO#: 0.5 10*3/uL (ref 0.1–0.9)
MONO%: 8.6 % (ref 0.0–13.0)
NEUT#: 3.9 10*3/uL (ref 1.5–6.5)
NEUT%: 63.6 % (ref 39.6–80.0)
PLATELETS: 265 10*3/uL (ref 145–400)
RBC: 4.37 10*6/uL (ref 3.70–5.32)
RDW: 13 % (ref 11.1–15.7)
WBC: 6.2 10*3/uL (ref 3.9–10.0)

## 2017-07-28 LAB — CMP (CANCER CENTER ONLY)
ALBUMIN: 3.9 g/dL (ref 3.3–5.5)
ALK PHOS: 64 U/L (ref 26–84)
ALT: 19 U/L (ref 10–47)
AST: 26 U/L (ref 11–38)
BUN: 13 mg/dL (ref 7–22)
CALCIUM: 10 mg/dL (ref 8.0–10.3)
CO2: 29 mEq/L (ref 18–33)
Chloride: 105 mEq/L (ref 98–108)
Creat: 0.8 mg/dl (ref 0.6–1.2)
Glucose, Bld: 102 mg/dL (ref 73–118)
POTASSIUM: 3.8 meq/L (ref 3.3–4.7)
Sodium: 141 mEq/L (ref 128–145)
Total Bilirubin: 0.7 mg/dl (ref 0.20–1.60)
Total Protein: 7.6 g/dL (ref 6.4–8.1)

## 2017-07-28 NOTE — Progress Notes (Signed)
Hematology and Oncology Follow Up Visit  Maureen Duncan [id number] May 24, 1951 66 y.o. 07/28/2017   Principle Diagnosis:  Stage IIb (T2N1M0) ductal carcinoma of the right breast-ER negative/HER-2 positive History of DVT of the left subclavian vein  Current Therapy:    Aspirin 162 mg by mouth daily     Interim History:  Ms.  Duncan is back for followup. She is doing pretty well. We last saw her back in April. Since then, she has been doing okay. She took her grandson on his first plane ride down to [facility]. He had a very good time.  She has blood pressure problems. She does not want to go on blood pressure medication.  She's had no bleeding. Has been no bruising. She's had no cough or shortness of breath. She's had no change in bowel or bladder habits.  There's been no headache.  Overall, her performance status is ECOG 0.     Medications:  Current Outpatient Prescriptions:  .  acetaminophen (TYLENOL) 500 MG tablet, Take by mouth., Disp: , Rfl:  .  aspirin 81 MG tablet, Take 81 mg by mouth daily., Disp: , Rfl:  .  diphenhydrAMINE (BENADRYL) 25 MG tablet, Take 50 mg by mouth every 6 (six) hours as needed for itching or allergies. , Disp: , Rfl:  .  diphenhydramine-acetaminophen (TYLENOL PM) 25-500 MG TABS, Take 1 tablet by mouth at bedtime as needed (sleep). , Disp: , Rfl:  .  fluticasone (FLONASE) 50 MCG/ACT nasal spray, PLACE 2 SPRAYS INTO BOTH NOSTRILS AS NEEDED., Disp: 16 g, Rfl: 4 .  loratadine (CLARITIN) 10 MG tablet, Take by mouth., Disp: , Rfl:  .  Melatonin 3 MG TABS, Take 2 tablets by mouth at bedtime as needed (sleep). , Disp: , Rfl:  .  Multiple Vitamin (MULTIVITAMIN) capsule, Take 1 capsule by mouth daily., Disp: , Rfl:  .  RESTASIS 0.05 % ophthalmic emulsion, PLACE 1 DROP INTO BOTH EYES TWICE A DAY, Disp: , Rfl: 3 .  Vitamin D, Ergocalciferol, (DRISDOL) 50000 units CAPS capsule, TAKE 1 CAPSULE (50,000 UNITS TOTAL) BY MOUTH EVERY 7 (SEVEN) DAYS., Disp: 20 capsule,  Rfl: 3 No current facility-administered medications for this visit.   Facility-Administered Medications Ordered in Other Visits:  .  sodium chloride 0.9 % injection 10 mL, 10 mL, Intravenous, PRN, Volanda Napoleon, MD, 10 mL at 06/17/12 1651  Allergies:  Allergies  Allergen Reactions  . Other Rash  . Adhesive [Tape] Rash    Past Medical History, Surgical history, Social history, and Family History were reviewed and updated.  Review of Systems: As stated in the interim history  Physical Exam:  weight is 183 lb (83 kg). Her oral temperature is 98.4 F (36.9 C). Her blood pressure is 153/91 (abnormal) and her pulse is 83. Her respiration is 16 and oxygen saturation is 100%.   I examined Maureen Duncan. The results of my examination are noted below with appropriate changes:   Well-developed and well-nourished African-American female. Head and neck exam shows no ocular or oral lesions. She has no adenopathy in the neck. Lungs are clear. Cardiac exam regular rate and rhythm with no murmurs, rubs or bruits. We're not I'll go ahead rate was yes yes he did. Breast exam shows left breast with no masses, edema or erythema. There is no left axillary adenopathy. Right chest wall shows well-healed mastectomy. She has some firmness at the mastectomy site. She's had some reddish changes. There's no nodularity. There is no right axillary adenopathy. Abdomen  is soft . She has good bowel sounds. There is no fluid wave. There is no palpable liver or spleen tip. Extremities shows some slight chronic nonpitting edema of the right arm. Left arm is unremarkable. Back exam shows no tenderness over the spine, ribs or hips. Skin exam shows no rashes, ecchymosis or petechia. Neurological exam is nonfocal.  Lab Results  Component Value Date   WBC 6.2 07/28/2017   HGB 13.7 07/28/2017   HCT 41.1 07/28/2017   MCV 94 07/28/2017   PLT 265 07/28/2017     Chemistry      Component Value Date/Time   NA 141 07/28/2017  0911   NA 142 01/27/2017 0849   K 3.8 07/28/2017 0911   K 4.3 01/27/2017 0849   CL 105 07/28/2017 0911   CO2 29 07/28/2017 0911   CO2 29 01/27/2017 0849   BUN 13 07/28/2017 0911   BUN 16.9 01/27/2017 0849   CREATININE 0.8 07/28/2017 0911   CREATININE 0.8 01/27/2017 0849      Component Value Date/Time   CALCIUM 10.0 07/28/2017 0911   CALCIUM 10.5 (H) 01/27/2017 0849   ALKPHOS 64 07/28/2017 0911   ALKPHOS 67 01/27/2017 0849   AST 26 07/28/2017 0911   AST 20 01/27/2017 0849   ALT 19 07/28/2017 0911   ALT 16 01/27/2017 0849   BILITOT 0.70 07/28/2017 0911   BILITOT 0.53 01/27/2017 0849         Impression and Plan: Maureen Duncan is 66 year old African female. She has a history of stage IIb ductal carcinoma of the right breast. Her tumor was HER-2 positive. She was treated with systemic chemotherapy followed by a year of Herceptin. She completed this in August of 2014.  Of note, she had 3 positive lymph nodes. She did receive 6 cycles of chemotherapy with Taxotere and carboplatinum.  From my point of view, everything looks okay. I am worried about her blood pressure. She does not want any blood pressure medications.  I will have her come back in 4 months. If her blood pressure is no better in 4 months, she will then agreed to blood pressure medication intervention.  Volanda Napoleon, MD 10/1/201810:08 AM

## 2017-09-03 DIAGNOSIS — E7800 Pure hypercholesterolemia, unspecified: Secondary | ICD-10-CM | POA: Diagnosis not present

## 2017-09-20 DIAGNOSIS — R609 Edema, unspecified: Secondary | ICD-10-CM | POA: Diagnosis not present

## 2017-10-24 DIAGNOSIS — Z853 Personal history of malignant neoplasm of breast: Secondary | ICD-10-CM | POA: Diagnosis not present

## 2017-10-24 DIAGNOSIS — Z1231 Encounter for screening mammogram for malignant neoplasm of breast: Secondary | ICD-10-CM | POA: Diagnosis not present

## 2017-11-27 ENCOUNTER — Emergency Department (HOSPITAL_COMMUNITY): Payer: Federal, State, Local not specified - PPO

## 2017-11-28 ENCOUNTER — Ambulatory Visit: Payer: Federal, State, Local not specified - PPO | Admitting: Hematology & Oncology

## 2017-11-28 ENCOUNTER — Other Ambulatory Visit: Payer: Self-pay

## 2017-11-28 ENCOUNTER — Other Ambulatory Visit: Payer: Federal, State, Local not specified - PPO

## 2017-11-28 ENCOUNTER — Emergency Department (HOSPITAL_BASED_OUTPATIENT_CLINIC_OR_DEPARTMENT_OTHER)
Admit: 2017-11-28 | Discharge: 2017-11-28 | Disposition: A | Discharge status: Home / Self Care | Payer: Federal, State, Local not specified - PPO | Attending: Emergency Medicine | Admitting: Emergency Medicine

## 2017-11-28 ENCOUNTER — Emergency Department (HOSPITAL_COMMUNITY)
Admission: EM | Admit: 2017-11-28 | Discharge: 2017-11-28 | Disposition: A | Discharge status: Home / Self Care | Payer: Federal, State, Local not specified - PPO | Attending: Emergency Medicine | Admitting: Emergency Medicine

## 2017-11-28 ENCOUNTER — Emergency Department (HOSPITAL_COMMUNITY): Payer: Federal, State, Local not specified - PPO

## 2017-11-28 ENCOUNTER — Encounter (HOSPITAL_COMMUNITY): Payer: Self-pay

## 2017-11-28 DIAGNOSIS — M25462 Effusion, left knee: Secondary | ICD-10-CM | POA: Insufficient documentation

## 2017-11-28 DIAGNOSIS — Z79899 Other long term (current) drug therapy: Secondary | ICD-10-CM | POA: Diagnosis not present

## 2017-11-28 DIAGNOSIS — M25562 Pain in left knee: Secondary | ICD-10-CM | POA: Diagnosis not present

## 2017-11-28 DIAGNOSIS — M7989 Other specified soft tissue disorders: Secondary | ICD-10-CM | POA: Diagnosis not present

## 2017-11-28 DIAGNOSIS — Z87891 Personal history of nicotine dependence: Secondary | ICD-10-CM | POA: Insufficient documentation

## 2017-11-28 DIAGNOSIS — Z7982 Long term (current) use of aspirin: Secondary | ICD-10-CM | POA: Diagnosis not present

## 2017-11-28 DIAGNOSIS — Z86718 Personal history of other venous thrombosis and embolism: Secondary | ICD-10-CM | POA: Insufficient documentation

## 2017-11-28 MED ORDER — TRAMADOL HCL 50 MG PO TABS: 50.0000 mg | ORAL_TABLET | Freq: Four times a day (QID) | ORAL | 0 refills | Status: DC | PRN

## 2017-11-28 MED ORDER — TRAMADOL HCL 50 MG PO TABS
50.0000 mg | ORAL_TABLET | Freq: Once | ORAL | Status: AC
  Administered 2017-11-28: 50 mg via ORAL
  Filled 2017-11-28: qty 1

## 2017-11-28 NOTE — ED Provider Notes (Signed)
[facility] DEPT Provider Note   CSN: [id number] Arrival date & time: 11/28/17  [place]     History   Chief Complaint Chief Complaint  Patient presents with  . Joint Swelling    HPI Maureen Duncan is a 67 y.o. female.  HPI Patient presents with left knee pain.  Began yesterday.  Has been doing kickboxing exercises but no acute injury.  Swelling in the knee.  Seen at primary care doctor sent in for DVT workup.  No chest pain or trouble breathing.  Had a previous DVT while she had a Port-A-Cath in place.  Previous history of breast cancer but no current cancer. Past Medical History:  Diagnosis Date  . Breast cancer [facility]) right breast dx 12/ 2000;  recurrence right breast 01/30/12   oncologist-  dr Marin Olp--  Stage IIb, grade 3, (T2n1M0) ductal carcinoma right breast-ER negative/ HER-2 positive---  s/p  right modified mastectomy with node dissection (3 positive) and chemotherapy (ended aug 2013) and Herceptin ended Aug 2014  . Cystocele   . History of DVT (deep vein thrombosis)    left subclavian vein -- port of PAC--  removed 06-23-2012  . Lymphedema of upper extremity    right arm  . Postmenopausal osteoporosis   . Uterine prolapse   . Wears partial dentures    upper    Patient Active Problem List   Diagnosis Date Noted  . Prolapse of female bladder, acquired 05/08/2015  . Right elbow pain 07/25/2014  . Hyperlipidemia 04/28/2014  . Allergic rhinitis 04/28/2014  . Breast cancer, right. T2, N1. Mastectomy 01/30/2012. 01/09/2012    Past Surgical History:  Procedure Laterality Date  . BREAST LUMPECTOMY Right Jan 2001  . COLONOSCOPY  last one -- June 2016   WNL  . CYSTOCELE REPAIR N/A 05/08/2015   Procedure: ANTERIOR REPAIR (CYSTOCELE);  Surgeon: Molli Posey, MD;  Location: [facility];  Service: Gynecology;  Laterality: N/A;  . LAPAROSCOPIC ASSISTED VAGINAL HYSTERECTOMY N/A 05/08/2015   Procedure: LAPAROSCOPIC ASSISTED VAGINAL  HYSTERECTOMY;  Surgeon: Molli Posey, MD;  Location: [facility];  Service: Gynecology;  Laterality: N/A;  . LAPAROSCOPIC BILATERAL SALPINGO OOPHERECTOMY Bilateral 05/08/2015   Procedure: LAPAROSCOPIC BILATERAL SALPINGO OOPHORECTOMY;  Surgeon: Molli Posey, MD;  Location: [facility];  Service: Gynecology;  Laterality: Bilateral;  . MODIFIED RADICAL MASTECTOMY W/ AXILLARY LYMPH NODE DISSECTION Right 01/30/12  . PORT-A-CATH REMOVAL  06/23/2012   Procedure: MINOR REMOVAL PORT-A-CATH;  Surgeon: Shann Medal, MD;  Location: [facility];  Service: General;  Laterality: Left;  . PORTACATH PLACEMENT  01/30/2012   Procedure: INSERTION PORT-A-CATH;  Surgeon: Shann Medal, MD;  Location: [facility];  Service: General;  Laterality: Left;  . TRANSTHORACIC ECHOCARDIOGRAM  08-24-2012   grade I diastolic dysfunction,  ef 45-50%/  trivial MR  . TUBAL LIGATION  1980    OB History    No data available       Home Medications    Prior to Admission medications   Medication Sig Start Date End Date Taking? Authorizing Provider  acetaminophen (TYLENOL) 500 MG tablet Take by mouth.    [provider]  aspirin 81 MG tablet Take 81 mg by mouth daily.    [provider]  diphenhydrAMINE (BENADRYL) 25 MG tablet Take 50 mg by mouth every 6 (six) hours as needed for itching or allergies.     [provider]  diphenhydramine-acetaminophen (TYLENOL PM) 25-500 MG TABS Take 1 tablet by  mouth at bedtime as needed (sleep).     [provider]  fluticasone (FLONASE) 50 MCG/ACT nasal spray PLACE 2 SPRAYS INTO BOTH NOSTRILS AS NEEDED. 01/20/17   Volanda Napoleon, MD  loratadine (CLARITIN) 10 MG tablet Take by mouth.    [provider]  Melatonin 3 MG TABS Take 2 tablets by mouth at bedtime as needed (sleep).     [provider]  Multiple Vitamin (MULTIVITAMIN) capsule Take 1 capsule by mouth daily.    [provider]    RESTASIS 0.05 % ophthalmic emulsion PLACE 1 DROP INTO BOTH EYES TWICE A DAY 01/15/16   [provider]  traMADol (ULTRAM) 50 MG tablet Take 1 tablet (50 mg total) by mouth every 6 (six) hours as needed. 11/28/17   Davonna Belling, MD  Vitamin D, Ergocalciferol, (DRISDOL) 50000 units CAPS capsule TAKE 1 CAPSULE (50,000 UNITS TOTAL) BY MOUTH EVERY 7 (SEVEN) DAYS. 08/29/16   Volanda Napoleon, MD  calcium-vitamin D (OSCAL) 250-125 MG-UNIT per tablet Take 1 tablet by mouth daily.    01/20/12  [provider]    Family History Family History  Problem Relation Age of Onset  . Diabetes Mother   . Hypertension Mother   . Kidney failure Mother   . Kidney disease Mother   . Cancer Mother        pancreatic  . Other Brother        heart transplant  . Cancer Maternal Uncle        lung    Social History Social History   Tobacco Use  . Smoking status: Former Smoker    Packs/day: 0.25    Years: 2.00    Pack years: 0.50    Types: Cigarettes    Start date: 03/31/2010    Last attempt to quit: 11/11/2011    Years since quitting: 6.0  . Smokeless tobacco: Never Used  . Tobacco comment: social smoker  Substance Use Topics  . Alcohol use: Yes    Alcohol/week: 0.0 oz    Comment: occasional  . Drug use: No     Allergies   Other and Adhesive [tape]   Review of Systems Review of Systems  Constitutional: Negative for appetite change and fever.  Respiratory: Negative for shortness of breath.   Cardiovascular: Negative for chest pain.  Gastrointestinal: Negative for abdominal pain.  Genitourinary: Negative for flank pain.  Musculoskeletal: Negative for back pain.       Left knee pain and swelling.  Skin: Negative for rash.  Neurological: Negative for weakness.  Hematological: Negative for adenopathy.     Physical Exam Updated Vital Signs BP (!) 147/104 (BP Location: Left Arm)   Pulse 84   Temp 98.4 F (36.9 C) (Oral)   Resp 18   Ht 5' 5"  (1.651 m)   Wt 83.5 kg  (184 lb)   SpO2 95%   BMI 30.62 kg/m   Physical Exam  Constitutional: She appears well-developed.  HENT:  Head: Atraumatic.  Eyes: Pupils are equal, round, and reactive to light.  Cardiovascular: Normal rate.  Musculoskeletal: She exhibits edema.  Effusion the left knee.  Some pain with movement at the knee.  No erythema.  Some distal edema also.  Knee is stable.  Decreased range of motion.  Skin: Skin is warm.     ED Treatments / Results  Labs (all labs ordered are listed, but only abnormal results are displayed) Labs Reviewed - No data to display  EKG  EKG Interpretation None  Radiology Dg Knee Complete 4 Views Left  Result Date: 11/28/2017 CLINICAL DATA:  Left knee pain and swelling. EXAM: LEFT KNEE - COMPLETE 4+ VIEW COMPARISON:  None. FINDINGS: No acute fracture or dislocation. Moderate-severe medial femorotibial compartment joint space narrowing with marginal osteophytes. Lateral femorotibial compartment joint space is relatively well maintained with tiny marginal osteophytes. Mild patellofemoral compartment osteoarthritis. Small joint effusion. IMPRESSION: 1.  No acute osseous injury of the left knee. 2. Moderate-severe osteoarthritis of the medial femorotibial compartment. Electronically Signed   By: Kathreen Devoid   On: 11/28/2017 20:57    Procedures Procedures (including critical care time)  Medications Ordered in ED Medications - No data to display   Initial Impression / Assessment and Plan / ED Course  I have reviewed the triage vital signs and the nursing notes.  Pertinent labs & imaging results that were available during my care of the patient were reviewed by me and considered in my medical decision making (see chart for details).     Patient with left knee pain.  Likely arthritis versus internal knee related.  Doubt infection.  Doppler done due to primary care worry and previous history of DVT.  Negative for clot.  Will discharge home with knee  immobilizer and some pain medicines.  Follow-up with Ortho as needed.  Final Clinical Impressions(s) / ED Diagnoses   Final diagnoses:  Acute pain of left knee    ED Discharge Orders        Ordered    traMADol (ULTRAM) 50 MG tablet  Every 6 hours PRN     11/28/17 2140       Davonna Belling, MD 11/28/17 2149

## 2017-11-28 NOTE — ED Notes (Signed)
Knee immobilizer in place on left knee. Good pulse, movement and sensation assessed afterwards.

## 2017-11-28 NOTE — ED Notes (Signed)
ED Provider at bedside. PICKERING 

## 2017-11-28 NOTE — ED Triage Notes (Signed)
Patient c/o left knee swelling since yesterday. Patient states she has been doing kick boxing type exercises.

## 2017-11-28 NOTE — ED Notes (Signed)
VASCULAR IN PROCESS

## 2017-11-28 NOTE — Progress Notes (Signed)
Preliminary results by tech  - Left Venous Duplex Completed. Negative for deep and superficial vein thrombosis. A small popliteal cyst was noted. Oda Cogan, BS, RDMS, RVT

## 2017-12-08 ENCOUNTER — Ambulatory Visit (INDEPENDENT_AMBULATORY_CARE_PROVIDER_SITE_OTHER): Payer: Federal, State, Local not specified - PPO | Admitting: Orthopedic Surgery

## 2017-12-08 ENCOUNTER — Encounter (INDEPENDENT_AMBULATORY_CARE_PROVIDER_SITE_OTHER): Payer: Self-pay | Admitting: Orthopedic Surgery

## 2017-12-08 DIAGNOSIS — M25562 Pain in left knee: Secondary | ICD-10-CM | POA: Diagnosis not present

## 2017-12-08 DIAGNOSIS — M1712 Unilateral primary osteoarthritis, left knee: Secondary | ICD-10-CM | POA: Diagnosis not present

## 2017-12-08 MED ORDER — MELOXICAM 15 MG PO TABS: 15.0000 mg | ORAL_TABLET | Freq: Every day | ORAL | 0 refills | Status: DC

## 2017-12-08 NOTE — Progress Notes (Signed)
Office Visit Note   Patient: Maureen Duncan           Date of Birth: [birth year]           MRN: [id number] Visit Date: 12/08/2017 Requested by: Lanice Shirts, MD [address] [place], [place] [address] PCP: Lanice Shirts, MD  Subjective: Chief Complaint  Patient presents with  . Left Knee - Pain    HPI: Maureen Duncan is a 67 year old female with left knee pain.  Onset 11/27/2017.  She was doing kickboxing exercises.  She does have a history of DVT but that was in her left upper extremity where she had a chemotherapy Port-A-Cath.  She is been cancer free since 2013.  She was given a knee immobilizer in the emergency room.  Takes over-the-counter Motrin which does not help much.  She works from home.  She plans on retiring sometime this year.  She does primarily sit down work.  She does report some diminished walking endurance.              ROS: All systems reviewed are negative as they relate to the chief complaint within the history of present illness.  Patient denies  fevers or chills.   Assessment & Plan: Visit Diagnoses:  1. Unilateral primary osteoarthritis, left knee   2. Acute pain of left knee     Plan: Impression is left knee pain.  Moderately severe medial compartment arthritis present in that left knee.  She does have a significant effusion today.  Aspiration and injection are performed today.  See her back in 6 weeks to discuss further options.  She may need knee replacement sometime in the future but I like to see how she does with an injection first.  Encourage nonweightbearing quad strengthening exercises such as stationary bike in the meantime.  Also had like to put her on Mobic 1 a day for 3 weeks and then daily only as needed after that.  Follow-Up Instructions: Return in about 6 weeks (around 01/19/2018).   Orders:  No orders of the defined types were placed in this encounter.  Meds ordered this encounter  Medications  . meloxicam (MOBIC) 15 MG  tablet    Sig: Take 1 tablet (15 mg total) by mouth daily. Take 1 po qd x 3 weeks, then 1 po qd prn    Dispense:  35 tablet    Refill:  0      Procedures: No procedures performed   Clinical Data: No additional findings.  Objective: Vital Signs: There were no vitals taken for this visit.  Physical Exam:   Constitutional: Patient appears well-developed HEENT:  Head: Normocephalic Eyes:EOM are normal Neck: Normal range of motion Cardiovascular: Normal rate Pulmonary/chest: Effort normal Neurologic: Patient is alert Skin: Skin is warm Psychiatric: Patient has normal mood and affect    Ortho Exam: Orthopedic exam demonstrates palpable pedal pulses bilaterally with no groin pain with internal/external rotation of the leg.  Extensor mechanism is intact.  Collateral and cruciate ligaments in that left knee are stable.  Mild effusion to moderate effusion is present.  There is medial greater than lateral joint line tenderness.  Range of motion in the left knee is only to about 95 or 100.  In the right knee she is getting to 125.  Specialty Comments:  No specialty comments available.  Imaging: No results found.   PMFS History: Patient Active Problem List   Diagnosis Date Noted  . Prolapse of female bladder,  acquired 05/08/2015  . Right elbow pain 07/25/2014  . Hyperlipidemia 04/28/2014  . Allergic rhinitis 04/28/2014  . Breast cancer, right. T2, N1. Mastectomy 01/30/2012. 01/09/2012   Past Medical History:  Diagnosis Date  . Breast cancer [facility]) right breast dx 12/ 2000;  recurrence right breast 01/30/12   oncologist-  dr Marin Olp--  Stage IIb, grade 3, (T2n1M0) ductal carcinoma right breast-ER negative/ HER-2 positive---  s/p  right modified mastectomy with node dissection (3 positive) and chemotherapy (ended aug 2013) and Herceptin ended Aug 2014  . Cystocele   . History of DVT (deep vein thrombosis)    left subclavian vein -- port of PAC--  removed 06-23-2012  . Lymphedema  of upper extremity    right arm  . Postmenopausal osteoporosis   . Uterine prolapse   . Wears partial dentures    upper    Family History  Problem Relation Age of Onset  . Diabetes Mother   . Hypertension Mother   . Kidney failure Mother   . Kidney disease Mother   . Cancer Mother        pancreatic  . Other Brother        heart transplant  . Cancer Maternal Uncle        lung    Past Surgical History:  Procedure Laterality Date  . BREAST LUMPECTOMY Right Jan 2001  . COLONOSCOPY  last one -- June 2016   WNL  . CYSTOCELE REPAIR N/A 05/08/2015   Procedure: ANTERIOR REPAIR (CYSTOCELE);  Surgeon: Molli Posey, MD;  Location: [facility];  Service: Gynecology;  Laterality: N/A;  . LAPAROSCOPIC ASSISTED VAGINAL HYSTERECTOMY N/A 05/08/2015   Procedure: LAPAROSCOPIC ASSISTED VAGINAL HYSTERECTOMY;  Surgeon: Molli Posey, MD;  Location: [place];  Service: Gynecology;  Laterality: N/A;  . LAPAROSCOPIC BILATERAL SALPINGO OOPHERECTOMY Bilateral 05/08/2015   Procedure: LAPAROSCOPIC BILATERAL SALPINGO OOPHORECTOMY;  Surgeon: Molli Posey, MD;  Location: [place];  Service: Gynecology;  Laterality: Bilateral;  . MODIFIED RADICAL MASTECTOMY W/ AXILLARY LYMPH NODE DISSECTION Right 01/30/12  . PORT-A-CATH REMOVAL  06/23/2012   Procedure: MINOR REMOVAL PORT-A-CATH;  Surgeon: Shann Medal, MD;  Location: [place];  Service: General;  Laterality: Left;  . PORTACATH PLACEMENT  01/30/2012   Procedure: INSERTION PORT-A-CATH;  Surgeon: Shann Medal, MD;  Location: [place];  Service: General;  Laterality: Left;  . TRANSTHORACIC ECHOCARDIOGRAM  08-24-2012   grade I diastolic dysfunction,  ef 45-50%/  trivial MR  . TUBAL LIGATION  1980   Social History   Occupational History  . Not on file  Tobacco Use  . Smoking status: Former Smoker    Packs/day: 0.25    Years: 2.00    Pack years: 0.50    Types: Cigarettes    Start date:  03/31/2010    Last attempt to quit: 11/11/2011    Years since quitting: 6.0  . Smokeless tobacco: Never Used  . Tobacco comment: social smoker  Substance and Sexual Activity  . Alcohol use: Yes    Alcohol/week: 0.0 oz    Comment: occasional  . Drug use: No  . Sexual activity: Not on file

## 2018-01-05 ENCOUNTER — Inpatient Hospital Stay (HOSPITAL_BASED_OUTPATIENT_CLINIC_OR_DEPARTMENT_OTHER): Payer: Federal, State, Local not specified - PPO | Admitting: Hematology & Oncology

## 2018-01-05 ENCOUNTER — Other Ambulatory Visit: Payer: Self-pay

## 2018-01-05 ENCOUNTER — Inpatient Hospital Stay: Payer: Federal, State, Local not specified - PPO | Attending: Hematology & Oncology

## 2018-01-05 VITALS — BP 141/93 | HR 79 | Temp 98.3°F | Resp 16 | Wt 180.0 lb

## 2018-01-05 DIAGNOSIS — Z853 Personal history of malignant neoplasm of breast: Secondary | ICD-10-CM | POA: Diagnosis not present

## 2018-01-05 DIAGNOSIS — E559 Vitamin D deficiency, unspecified: Secondary | ICD-10-CM

## 2018-01-05 DIAGNOSIS — Z9221 Personal history of antineoplastic chemotherapy: Secondary | ICD-10-CM | POA: Diagnosis not present

## 2018-01-05 DIAGNOSIS — R030 Elevated blood-pressure reading, without diagnosis of hypertension: Secondary | ICD-10-CM | POA: Insufficient documentation

## 2018-01-05 DIAGNOSIS — Z7982 Long term (current) use of aspirin: Secondary | ICD-10-CM | POA: Insufficient documentation

## 2018-01-05 DIAGNOSIS — C50011 Malignant neoplasm of nipple and areola, right female breast: Secondary | ICD-10-CM

## 2018-01-05 DIAGNOSIS — Z171 Estrogen receptor negative status [ER-]: Secondary | ICD-10-CM | POA: Insufficient documentation

## 2018-01-05 DIAGNOSIS — Z86718 Personal history of other venous thrombosis and embolism: Secondary | ICD-10-CM | POA: Diagnosis not present

## 2018-01-05 LAB — CMP (CANCER CENTER ONLY)
ALK PHOS: 67 U/L (ref 26–84)
ALT: 20 U/L (ref 10–47)
ANION GAP: 10 (ref 5–15)
AST: 25 U/L (ref 11–38)
Albumin: 3.8 g/dL (ref 3.5–5.0)
BUN: 12 mg/dL (ref 7–22)
CO2: 30 mmol/L (ref 18–33)
Calcium: 9.7 mg/dL (ref 8.0–10.3)
Chloride: 104 mmol/L (ref 98–108)
Creatinine: 0.8 mg/dL (ref 0.60–1.20)
GLUCOSE: 108 mg/dL (ref 73–118)
POTASSIUM: 3.7 mmol/L (ref 3.3–4.7)
Sodium: 144 mmol/L (ref 128–145)
TOTAL PROTEIN: 7.7 g/dL (ref 6.4–8.1)
Total Bilirubin: 0.7 mg/dL (ref 0.2–1.6)

## 2018-01-05 LAB — CBC WITH DIFFERENTIAL (CANCER CENTER ONLY)
Basophils Absolute: 0 10*3/uL (ref 0.0–0.1)
Basophils Relative: 0 %
EOS ABS: 0 10*3/uL (ref 0.0–0.5)
Eosinophils Relative: 1 %
HCT: 40.2 % (ref 34.8–46.6)
HEMOGLOBIN: 13.2 g/dL (ref 11.6–15.9)
LYMPHS ABS: 1.4 10*3/uL (ref 0.9–3.3)
Lymphocytes Relative: 24 %
MCH: 31.2 pg (ref 26.0–34.0)
MCHC: 32.8 g/dL (ref 32.0–36.0)
MCV: 95 fL (ref 81.0–101.0)
MONOS PCT: 9 %
Monocytes Absolute: 0.5 10*3/uL (ref 0.1–0.9)
NEUTROS PCT: 66 %
Neutro Abs: 3.9 10*3/uL (ref 1.5–6.5)
Platelet Count: 222 10*3/uL (ref 145–400)
RBC: 4.23 MIL/uL (ref 3.70–5.32)
RDW: 13.4 % (ref 11.1–15.7)
WBC Count: 5.9 10*3/uL (ref 3.9–10.0)

## 2018-01-05 LAB — LACTATE DEHYDROGENASE: LDH: 170 U/L (ref 125–245)

## 2018-01-05 MED ORDER — AMLODIPINE BESY-BENAZEPRIL HCL 5-20 MG PO CAPS: 1.0000 | ORAL_CAPSULE | Freq: Every day | ORAL | 4 refills | Status: DC

## 2018-01-05 NOTE — Progress Notes (Signed)
Hematology and Oncology Follow Up Visit  Maureen Duncan [id number] 03/25/1951 67 y.o. 01/05/2018   Principle Diagnosis:  Stage IIb (T2N1M0) ductal carcinoma of the right breast-ER negative/HER-2 positive History of DVT of the left subclavian vein  Current Therapy:    Aspirin 162 mg by mouth daily     Interim History:  Ms.  Duncan is back for followup.  She has been under a lot of stress.  Unfortunately, she was part of the government laid off for a month.  This really stressed her financially.  She is taking care of her grandson.  Again, the government shutdown really was a bad time for her.  Currently, she is doing okay.  She thinks she may retire at the end of this year.  She is had no fever.  She is had no nausea or vomiting.  She is had no bleeding.  There is been no change in bowel or bladder habits.  Her blood pressure has been running high.  It was little on the high side today.  I really need to call in some antihypertensive agent for her.  I will call in Lotrel.  She is taking her vitamin D.  Overall, her performance status is ECOG 0.     Medications:  Current Outpatient Medications:  .  acetaminophen (TYLENOL) 500 MG tablet, Take by mouth., Disp: , Rfl:  .  aspirin 81 MG tablet, Take 81 mg by mouth daily., Disp: , Rfl:  .  diphenhydrAMINE (BENADRYL) 25 MG tablet, Take 50 mg by mouth every 6 (six) hours as needed for itching or allergies. , Disp: , Rfl:  .  diphenhydramine-acetaminophen (TYLENOL PM) 25-500 MG TABS, Take 1 tablet by mouth at bedtime as needed (sleep). , Disp: , Rfl:  .  fluticasone (FLONASE) 50 MCG/ACT nasal spray, PLACE 2 SPRAYS INTO BOTH NOSTRILS AS NEEDED., Disp: 16 g, Rfl: 4 .  ibuprofen (ADVIL,MOTRIN) 200 MG tablet, Take 200 mg by mouth every 6 (six) hours as needed., Disp: , Rfl:  .  Melatonin 3 MG TABS, Take 2 tablets by mouth at bedtime as needed (sleep). , Disp: , Rfl:  .  Multiple Vitamin (MULTIVITAMIN) capsule, Take 1 capsule by mouth daily.,  Disp: , Rfl:  .  RESTASIS 0.05 % ophthalmic emulsion, PLACE 1 DROP INTO BOTH EYES TWICE A DAY, Disp: , Rfl: 3 .  Vitamin D, Ergocalciferol, (DRISDOL) 50000 units CAPS capsule, TAKE 1 CAPSULE (50,000 UNITS TOTAL) BY MOUTH EVERY 7 (SEVEN) DAYS., Disp: 20 capsule, Rfl: 3 No current facility-administered medications for this visit.   Facility-Administered Medications Ordered in Other Visits:  .  sodium chloride 0.9 % injection 10 mL, 10 mL, Intravenous, PRN, Volanda Napoleon, MD, 10 mL at 06/17/12 1651  Allergies:  Allergies  Allergen Reactions  . Other Rash  . Adhesive [Tape] Rash    Past Medical History, Surgical history, Social history, and Family History were reviewed and updated.  Review of Systems: Review of Systems  Constitutional: Negative.   HENT: Negative.   Eyes: Negative.   Respiratory: Negative.   Cardiovascular: Negative.   Gastrointestinal: Negative.   Genitourinary: Negative.   Musculoskeletal: Negative.   Skin: Negative.   Neurological: Negative.   Endo/Heme/Allergies: Negative.   Psychiatric/Behavioral: Negative.     Physical Exam:  weight is 180 lb (81.6 kg). Her oral temperature is 98.3 F (36.8 C). Her blood pressure is 141/93 (abnormal) and her pulse is 79. Her respiration is 16 and oxygen saturation is 97%.   Physical Exam  Constitutional: She is oriented to person, place, and time.  HENT:  Head: Normocephalic and atraumatic.  Mouth/Throat: Oropharynx is clear and moist.  Eyes: EOM are normal. Pupils are equal, round, and reactive to light.  Neck: Normal range of motion.  Cardiovascular: Normal rate, regular rhythm and normal heart sounds.  Pulmonary/Chest: Effort normal and breath sounds normal.  Abdominal: Soft. Bowel sounds are normal.  Musculoskeletal: Normal range of motion. She exhibits no edema, tenderness or deformity.  Lymphadenopathy:    She has no cervical adenopathy.  Neurological: She is alert and oriented to person, place, and time.    Skin: Skin is warm and dry. No rash noted. No erythema.  Psychiatric: She has a normal mood and affect. Her behavior is normal. Judgment and thought content normal.  Vitals reviewed.    Lab Results  Component Value Date   WBC 5.9 01/05/2018   HGB 13.7 07/28/2017   HCT 40.2 01/05/2018   MCV 95.0 01/05/2018   PLT 222 01/05/2018     Chemistry      Component Value Date/Time   NA 144 01/05/2018 1053   NA 141 07/28/2017 0911   NA 142 01/27/2017 0849   K 3.7 01/05/2018 1053   K 3.8 07/28/2017 0911   K 4.3 01/27/2017 0849   CL 104 01/05/2018 1053   CL 105 07/28/2017 0911   CO2 30 01/05/2018 1053   CO2 29 07/28/2017 0911   CO2 29 01/27/2017 0849   BUN 12 01/05/2018 1053   BUN 13 07/28/2017 0911   BUN 16.9 01/27/2017 0849   CREATININE 0.80 01/05/2018 1053   CREATININE 0.8 07/28/2017 0911   CREATININE 0.8 01/27/2017 0849      Component Value Date/Time   CALCIUM 9.7 01/05/2018 1053   CALCIUM 10.0 07/28/2017 0911   CALCIUM 10.5 (H) 01/27/2017 0849   ALKPHOS 67 01/05/2018 1053   ALKPHOS 64 07/28/2017 0911   ALKPHOS 67 01/27/2017 0849   AST 25 01/05/2018 1053   AST 20 01/27/2017 0849   ALT 20 01/05/2018 1053   ALT 19 07/28/2017 0911   ALT 16 01/27/2017 0849   BILITOT 0.7 01/05/2018 1053   BILITOT 0.53 01/27/2017 0849         Impression and Plan: Maureen Duncan is 67 year old African female. She has a history of stage IIb ductal carcinoma of the right breast. Her tumor was HER-2 positive. She was treated with systemic chemotherapy followed by a year of Herceptin. She completed this in August of 2014.  Of note, she had 3 positive lymph nodes. She did receive 6 cycles of chemotherapy with Taxotere and carboplatinum.  From my point of view, everything looks okay. I am worried about her blood pressure. She does not want any blood pressure medications.  I will have her come back in 6 months.  I am going to call her in some Lotrel (5/20 p.o. daily) for her blood pressure.  She  promises to see her family doctor about this.  Volanda Napoleon, MD 3/11/20191:35 PM

## 2018-01-06 ENCOUNTER — Telehealth: Payer: Self-pay

## 2018-01-06 ENCOUNTER — Other Ambulatory Visit: Payer: Self-pay

## 2018-01-06 DIAGNOSIS — E559 Vitamin D deficiency, unspecified: Secondary | ICD-10-CM

## 2018-01-06 LAB — VITAMIN D 25 HYDROXY (VIT D DEFICIENCY, FRACTURES): Vit D, 25-Hydroxy: 19.2 ng/mL — ABNORMAL LOW (ref 30.0–100.0)

## 2018-01-06 MED ORDER — VITAMIN D (ERGOCALCIFEROL) 1.25 MG (50000 UNIT) PO CAPS: 50000.0000 [IU] | ORAL_CAPSULE | ORAL | 3 refills | Status: DC

## 2018-01-06 NOTE — Telephone Encounter (Addendum)
-----   Message from Volanda Napoleon, MD sent at 01/06/2018  6:24 AM EDT ----- Call - vit D is low!!!  Need to take the 50000 unit dose 2x a week!!  Laurey Arrow  Above message given to pt who reports she has not been taking Vitamin D as prescribed "for probably 1 or 2 months." Per Dr Marin Olp, resume taking 50000units weekly. Pt verbalizes understanding. [facility]

## 2018-01-15 DIAGNOSIS — Z13820 Encounter for screening for osteoporosis: Secondary | ICD-10-CM | POA: Diagnosis not present

## 2018-01-15 DIAGNOSIS — Z01419 Encounter for gynecological examination (general) (routine) without abnormal findings: Secondary | ICD-10-CM | POA: Diagnosis not present

## 2018-01-15 DIAGNOSIS — Z6829 Body mass index (BMI) 29.0-29.9, adult: Secondary | ICD-10-CM | POA: Diagnosis not present

## 2018-01-16 ENCOUNTER — Ambulatory Visit (INDEPENDENT_AMBULATORY_CARE_PROVIDER_SITE_OTHER): Payer: Federal, State, Local not specified - PPO | Admitting: Orthopedic Surgery

## 2018-01-22 ENCOUNTER — Other Ambulatory Visit: Payer: Self-pay | Admitting: Hematology & Oncology

## 2018-01-23 ENCOUNTER — Encounter (INDEPENDENT_AMBULATORY_CARE_PROVIDER_SITE_OTHER): Payer: Self-pay | Admitting: Orthopedic Surgery

## 2018-01-23 ENCOUNTER — Ambulatory Visit (INDEPENDENT_AMBULATORY_CARE_PROVIDER_SITE_OTHER): Payer: Federal, State, Local not specified - PPO | Admitting: Orthopedic Surgery

## 2018-01-23 DIAGNOSIS — M1712 Unilateral primary osteoarthritis, left knee: Secondary | ICD-10-CM | POA: Diagnosis not present

## 2018-01-24 ENCOUNTER — Encounter (INDEPENDENT_AMBULATORY_CARE_PROVIDER_SITE_OTHER): Payer: Self-pay | Admitting: Orthopedic Surgery

## 2018-01-24 NOTE — Progress Notes (Signed)
Office Visit Note   Patient: Maureen Duncan           Date of Birth: 05-Oct-1951           MRN: [id number] Visit Date: 01/23/2018 Requested by: Lanice Shirts, MD [address] [place], [place] [address] PCP: Lanice Shirts, MD  Subjective: Chief Complaint  Patient presents with  . Left Knee - Pain, Follow-up    HPI: Maureen Duncan is a patient with left knee pain.  She had aspiration and injection February 11.  At some better but she still has swelling that comes and goes.  Date of injury 11/27/2017 while she was kickboxing.  The injection gave her only marginal relief for several days and the pain is come back.  Reports generally global pain in the knee.  Hard for her to turn and step.  She does sit down type work.  She uses Tylenol as well as topical creams and they do not help much.  She is a 2 times breast cancer survivor.  She did have a blood clot in her arm associated with her Port-A-Cath.              ROS: All systems reviewed are negative as they relate to the chief complaint within the history of present illness.  Patient denies  fevers or chills.   Assessment & Plan: Visit Diagnoses:  1. Unilateral primary osteoarthritis, left knee     Plan: Impression is left knee arthritis which is primarily medial patellofemoral but the patient has global pain.  Conservative measures have failed.  She has pretty severe arthritis based on imaging studies.  We discussed intervention and non-intervention.  Her best chance at less pain in the knee would be with total knee replacement.  Patient understands the risk and benefits of total knee replacement including but not limited to infection nerve vessel damage knee stiffness as well as fairly occult recovery within the first several weeks.  Home.  For her quality of life and her overall social and medical situation she would like to proceed with knee replacement.  I think that is a pretty reasonable decision for her based on her  response to interventions thus far.  All questions answered.  Follow-Up Instructions: No follow-ups on file.   Orders:  No orders of the defined types were placed in this encounter.  No orders of the defined types were placed in this encounter.     Procedures: No procedures performed   Clinical Data: No additional findings.  Objective: Vital Signs: There were no vitals taken for this visit.  Physical Exam:   Constitutional: Patient appears well-developed HEENT:  Head: Normocephalic Eyes:EOM are normal Neck: Normal range of motion Cardiovascular: Normal rate Pulmonary/chest: Effort normal Neurologic: Patient is alert Skin: Skin is warm Psychiatric: Patient has normal mood and affect    Ortho Exam: Orthopedic exam demonstrates slightly antalgic gait to the left with palpable pedal pulses.  Trace effusion in that left knee.  She has pretty reasonable range of motion in the left and right knee with stable collateral and cruciate ligaments.  Has medial greater than lateral joint line tenderness in the left with patellofemoral crepitus noted.  No other masses lymph adenopathy or skin changes noted in that left knee region.  Range of motion is to about 115 of flexion on the left.  Alignment is slight varus.  Specialty Comments:  No specialty comments available.  Imaging: No results found.   PMFS History: Patient  Active Problem List   Diagnosis Date Noted  . Prolapse of female bladder, acquired 05/08/2015  . Right elbow pain 07/25/2014  . Hyperlipidemia 04/28/2014  . Allergic rhinitis 04/28/2014  . Breast cancer, right. T2, N1. Mastectomy 01/30/2012. 01/09/2012   Past Medical History:  Diagnosis Date  . Breast cancer [facility]) right breast dx 12/ 2000;  recurrence right breast 01/30/12   oncologist-  dr Marin Olp--  Stage IIb, grade 3, (T2n1M0) ductal carcinoma right breast-ER negative/ HER-2 positive---  s/p  right modified mastectomy with node dissection (3 positive) and  chemotherapy (ended aug 2013) and Herceptin ended Aug 2014  . Cystocele   . History of DVT (deep vein thrombosis)    left subclavian vein -- port of PAC--  removed 06-23-2012  . Lymphedema of upper extremity    right arm  . Postmenopausal osteoporosis   . Uterine prolapse   . Wears partial dentures    upper    Family History  Problem Relation Age of Onset  . Diabetes Mother   . Hypertension Mother   . Kidney failure Mother   . Kidney disease Mother   . Cancer Mother        pancreatic  . Other Brother        heart transplant  . Cancer Maternal Uncle        lung    Past Surgical History:  Procedure Laterality Date  . BREAST LUMPECTOMY Right Jan 2001  . COLONOSCOPY  last one -- June 2016   WNL  . CYSTOCELE REPAIR N/A 05/08/2015   Procedure: ANTERIOR REPAIR (CYSTOCELE);  Surgeon: Molli Posey, MD;  Location: [facility];  Service: Gynecology;  Laterality: N/A;  . LAPAROSCOPIC ASSISTED VAGINAL HYSTERECTOMY N/A 05/08/2015   Procedure: LAPAROSCOPIC ASSISTED VAGINAL HYSTERECTOMY;  Surgeon: Molli Posey, MD;  Location: [place];  Service: Gynecology;  Laterality: N/A;  . LAPAROSCOPIC BILATERAL SALPINGO OOPHERECTOMY Bilateral 05/08/2015   Procedure: LAPAROSCOPIC BILATERAL SALPINGO OOPHORECTOMY;  Surgeon: Molli Posey, MD;  Location: [place];  Service: Gynecology;  Laterality: Bilateral;  . MODIFIED RADICAL MASTECTOMY W/ AXILLARY LYMPH NODE DISSECTION Right 01/30/12  . PORT-A-CATH REMOVAL  06/23/2012   Procedure: MINOR REMOVAL PORT-A-CATH;  Surgeon: Shann Medal, MD;  Location: [place];  Service: General;  Laterality: Left;  . PORTACATH PLACEMENT  01/30/2012   Procedure: INSERTION PORT-A-CATH;  Surgeon: Shann Medal, MD;  Location: [place];  Service: General;  Laterality: Left;  . TRANSTHORACIC ECHOCARDIOGRAM  08-24-2012   grade I diastolic dysfunction,  ef 45-50%/  trivial MR  . TUBAL LIGATION  1980   Social  History   Occupational History  . Not on file  Tobacco Use  . Smoking status: Former Smoker    Packs/day: 0.25    Years: 2.00    Pack years: 0.50    Types: Cigarettes    Start date: 03/31/2010    Last attempt to quit: 11/11/2011    Years since quitting: 6.2  . Smokeless tobacco: Never Used  . Tobacco comment: social smoker  Substance and Sexual Activity  . Alcohol use: Yes    Alcohol/week: 0.0 oz    Comment: occasional  . Drug use: No  . Sexual activity: Not on file

## 2018-01-30 ENCOUNTER — Telehealth (INDEPENDENT_AMBULATORY_CARE_PROVIDER_SITE_OTHER): Payer: Self-pay | Admitting: Orthopedic Surgery

## 2018-01-30 NOTE — Telephone Encounter (Signed)
Patient called wanting to schedule surgery with Dr. Marlou Sa as soon as possible. CB # [phone]

## 2018-02-05 NOTE — Telephone Encounter (Signed)
I spoke with Maureen Duncan. We scheduled surgery for Tuesday 02/24/2018 at [facility].  All surgery information given to patient and confirmation letter mailed to her home address.

## 2018-02-13 NOTE — Pre-Procedure Instructions (Signed)
Maureen Duncan  02/13/2018      [id number] [place], [place] - [address] [facility] [address] [place] [address] Phone: [phone] Fax: [place], [facility] [address] [place] [address] Phone: [phone] Fax: [phone]    Your procedure is scheduled on Tuesday April 30.  Report to [facility] Admitting at 5:30 A.M.  Call this number if you have problems the morning of surgery:  [phone]   Remember:  Do not eat food or drink liquids after midnight.  Take these medicines the morning of surgery with A SIP OF WATER:   Acetaminophen (tylenol) if needed Flonase if needed Ranitidine (Zantac) if needed  7 days prior to surgery STOP taking any Aleve, Naproxen, Ibuprofen, Motrin, Advil, Goody's, BC's, all herbal medications, fish oil, and all vitamins  **FOLLOW your surgeon's instructions on stopping Aspirin. If no instructions were given, please call your surgeon's office**   Do not wear jewelry, make-up or nail polish.  Do not wear lotions, powders, or perfumes, or deodorant.  Do not shave 48 hours prior to surgery.  Men may shave face and neck.  Do not bring valuables to the hospital.  [facility] is not responsible for any belongings or valuables.  Contacts, dentures or bridgework may not be worn into surgery.  Leave your suitcase in the car.  After surgery it may be brought to your room.  For patients admitted to the hospital, discharge time will be determined by your treatment team.  Patients discharged the day of surgery will not be allowed to drive home.   Special instructions:    [place]- Preparing For Surgery  Before surgery, you can play an important role. Because skin is not sterile, your skin needs to be as free of germs as possible. You can reduce the number of germs on your skin by washing with  CHG (chlorahexidine gluconate) Soap before surgery.  CHG is an antiseptic cleaner which kills germs and bonds with the skin to continue killing germs even after washing.  Please do not use if you have an allergy to CHG or antibacterial soaps. If your skin becomes reddened/irritated stop using the CHG.  Do not shave (including legs and underarms) for at least 48 hours prior to first CHG shower. It is OK to shave your face.  Please follow these instructions carefully.   1. Shower the NIGHT BEFORE SURGERY and the MORNING OF SURGERY with CHG.   2. If you chose to wash your hair, wash your hair first as usual with your normal shampoo.  3. After you shampoo, rinse your hair and body thoroughly to remove the shampoo.  4. Use CHG as you would any other liquid soap. You can apply CHG directly to the skin and wash gently with a scrungie or a clean washcloth.   5. Apply the CHG Soap to your body ONLY FROM THE NECK DOWN.  Do not use on open wounds or open sores. Avoid contact with your eyes, ears, mouth and genitals (private parts). Wash Face and genitals (private parts)  with your normal soap.  6. Wash thoroughly, paying special attention to the area where your surgery will be performed.  7. Thoroughly rinse your body with warm water from the neck down.  8. DO NOT shower/wash with your normal soap after using and rinsing off the  CHG Soap.  9. Pat yourself dry with a CLEAN TOWEL.  10. Wear CLEAN PAJAMAS to bed the night before surgery, wear comfortable clothes the morning of surgery  11. Place CLEAN SHEETS on your bed the night of your first shower and DO NOT SLEEP WITH PETS.    Day of Surgery: Do not apply any deodorants/lotions. Please wear clean clothes to the hospital/surgery center.      Please read over the following fact sheets that you were given. Coughing and Deep Breathing and Surgical Site Infection Prevention

## 2018-02-14 DIAGNOSIS — H00024 Hordeolum internum left upper eyelid: Secondary | ICD-10-CM | POA: Diagnosis not present

## 2018-02-16 ENCOUNTER — Encounter (HOSPITAL_COMMUNITY)
Admission: RE | Admit: 2018-02-16 | Discharge: 2018-02-16 | Disposition: A | Discharge status: Home / Self Care | Payer: Federal, State, Local not specified - PPO | Source: Ambulatory Visit | Attending: Orthopedic Surgery | Admitting: Orthopedic Surgery

## 2018-02-16 ENCOUNTER — Inpatient Hospital Stay (HOSPITAL_COMMUNITY): Payer: Federal, State, Local not specified - PPO | Admitting: Anesthesiology

## 2018-02-16 ENCOUNTER — Encounter (HOSPITAL_COMMUNITY): Payer: Self-pay

## 2018-02-16 ENCOUNTER — Other Ambulatory Visit: Payer: Self-pay

## 2018-02-16 ENCOUNTER — Inpatient Hospital Stay (HOSPITAL_COMMUNITY): Payer: Federal, State, Local not specified - PPO | Admitting: Emergency Medicine

## 2018-02-16 DIAGNOSIS — Z01818 Encounter for other preprocedural examination: Secondary | ICD-10-CM | POA: Diagnosis not present

## 2018-02-16 HISTORY — DX: Unspecified osteoarthritis, unspecified site: M19.90

## 2018-02-16 HISTORY — DX: Essential (primary) hypertension: I10

## 2018-02-16 HISTORY — DX: Left bundle-branch block, unspecified: I44.7

## 2018-02-16 HISTORY — DX: Gastro-esophageal reflux disease without esophagitis: K21.9

## 2018-02-16 LAB — BASIC METABOLIC PANEL
ANION GAP: 10 (ref 5–15)
BUN: 9 mg/dL (ref 6–20)
CALCIUM: 9.7 mg/dL (ref 8.9–10.3)
CO2: 22 mmol/L (ref 22–32)
Chloride: 104 mmol/L (ref 101–111)
Creatinine, Ser: 0.7 mg/dL (ref 0.44–1.00)
Glucose, Bld: 93 mg/dL (ref 65–99)
Potassium: 4 mmol/L (ref 3.5–5.1)
SODIUM: 136 mmol/L (ref 135–145)

## 2018-02-16 LAB — CBC
HCT: 39.1 % (ref 36.0–46.0)
HEMOGLOBIN: 12.8 g/dL (ref 12.0–15.0)
MCH: 30.8 pg (ref 26.0–34.0)
MCHC: 32.7 g/dL (ref 30.0–36.0)
MCV: 94.2 fL (ref 78.0–100.0)
Platelets: 276 10*3/uL (ref 150–400)
RBC: 4.15 MIL/uL (ref 3.87–5.11)
RDW: 13.5 % (ref 11.5–15.5)
WBC: 5.8 10*3/uL (ref 4.0–10.5)

## 2018-02-16 LAB — SURGICAL PCR SCREEN
MRSA, PCR: NEGATIVE
STAPHYLOCOCCUS AUREUS: NEGATIVE

## 2018-02-16 NOTE — Progress Notes (Signed)
PCP Emi Belfast  Saw a cardilogist in 2013 and ad ECHO,pt.does not know the name of the cardiologist and has not been back since.  Pt had an insect bite to left eye she is using Prednisone dose pak ,an antibiotic  And eye drops for the eye.

## 2018-02-16 NOTE — Progress Notes (Signed)
Can you run this by preop holding anesthesia in terms of she has Wolff-Parkinson-White pattern on her EKG I do not know if that is nonstarter or not for surgery

## 2018-02-16 NOTE — Progress Notes (Signed)
Discussed with Levada Dy in pre-admission.  She will look into patient's results, Ms. Rickles was seen this am in for pre-admission testing.

## 2018-02-16 NOTE — Progress Notes (Signed)
No orders for pre-admission visit.  IB message sent to Dr. Marlou Sa

## 2018-02-16 NOTE — Progress Notes (Signed)
When did I see her last

## 2018-02-16 NOTE — Progress Notes (Addendum)
Anesthesia Chart Review:   Case:  [id number] Date/Time:  02/24/18 0715   Procedure:  LEFT TOTAL KNEE ARTHROPLASTY (Left )   Anesthesia type:  Spinal   Pre-op diagnosis:  Osteoarthritis Left Knee   Location:  [place] OR ROOM 06 / [place] OR   Surgeon:  Meredith Pel, MD     [place] 02/23/18:   Pt saw Jeri Lager, NP at [facility] 02/20/18 for pre-op eval.  Pt had prior stress test and echo in 2017 and has no symptoms, therefore pt was cleared for surgery.  On review of office visit note, our concern of WPW on EKG was not addressed.  I reached out to Ms. Georgina Peer, who in turn spoke with cardiology Dr. Einar Gip; no further evaluation/testing needed prior to surgery.     DISCUSSION: Pt is a 67 year old female. EKG shows Ventricular pre-excitation, WPW pattern type B; prior EKG shows similar finding, but I do not see that this has been worked up.  Pt reports she saw cardiology in 2013, but I do not see this, I only see echocardiograms ordered by her oncologist as part of monitoring breast cancer treatment. Per anesthesiologist Dr. Deatra Canter, pt will need to see cardiology prior to surgery.  Sherrie in Dr. Randel Pigg office notified.    VS: BP (!) 152/100 Comment: taken manually/notified Doctor, hospital  Pulse 88   Temp 36.8 C   Resp 20   Ht _0  (1.651 m)   Wt 184 lb 4.8 oz (83.6 kg)   SpO2 100%   BMI 30.67 kg/m   PROVIDERS: Schoenhoff, Altamese Cabal, MD Patient Care Team: Volanda Napoleon, MD as Consulting Physician (Hematology and Oncology)   LABS: Labs reviewed: Acceptable for surgery. (all labs ordered are listed, but only abnormal results are displayed)  Labs Reviewed  SURGICAL PCR SCREEN  BASIC METABOLIC PANEL  CBC    EKG 02/16/18: NSR.  Ventricular pre-excitation, WPW pattern type B   CV:  Echo 01/17/16 [facility] cardiovascular): By notes:  1.  LV cavity normal in size.  Abnormal septal wall motion due to LBBB.  Doppler evidence of grade high diastolic dysfunction.   Borderline normal systolic function.  Calculated EF 51%. 2.  Trace mitral regurgitation. 3.  Mild tricuspid regurgitation.  No evidence of pulmonary hypertension  Nuclear stress test 12/29/2015 [facility] cardiovascular): 1.  Resting EKG demonstrated NSR, LBBB, and no resting arrhythmias.  Stress EKG nondiagnostic for ischemia as a pharmacologic stress using Lexiscan.  Stress symptoms included dyspnea. 2.  Stress and rest SPECT images demonstrate homogeneous tracer distribution throughout the myocardium.  SPECT imaging reveals normal myocardial thickening and wall motion.  LVEF was normal visually, but calculated at 45%.  This is a low risk study.   Past Medical History:  Diagnosis Date  . Arthritis   . Breast cancer [facility]) right breast dx 12/ 2000;  recurrence right breast 01/30/12   oncologist-  dr Marin Olp--  Stage IIb, grade 3, (T2n1M0) ductal carcinoma right breast-ER negative/ HER-2 positive---  s/p  right modified mastectomy with node dissection (3 positive) and chemotherapy (ended aug 2013) and Herceptin ended Aug 2014  . Cystocele   . GERD (gastroesophageal reflux disease)    occasionally, takes Zantac OTC when needed  . History of DVT (deep vein thrombosis)    left subclavian vein -- port of PAC--  removed 06-23-2012  . Hypertension   . LBBB (left bundle branch block)   . Lymphedema of upper extremity    right arm  .  Postmenopausal osteoporosis   . Uterine prolapse   . Wears partial dentures    upper    Past Surgical History:  Procedure Laterality Date  . BREAST LUMPECTOMY Right Jan 2001  . COLONOSCOPY  last one -- June 2016   WNL  . CYSTOCELE REPAIR N/A 05/08/2015   Procedure: ANTERIOR REPAIR (CYSTOCELE);  Surgeon: Molli Posey, MD;  Location[facility];  Service: Gynecology;  Laterality: N/A;  . LAPAROSCOPIC ASSISTED VAGINAL HYSTERECTOMY N/A 05/08/2015   Procedure: LAPAROSCOPIC ASSISTED VAGINAL HYSTERECTOMY;  Surgeon: Molli Posey, MD;  Location: [place];  Service: Gynecology;  Laterality: N/A;  . LAPAROSCOPIC BILATERAL SALPINGO OOPHERECTOMY Bilateral 05/08/2015   Procedure: LAPAROSCOPIC BILATERAL SALPINGO OOPHORECTOMY;  Surgeon: Molli Posey, MD;  Location: [place];  Service: Gynecology;  Laterality: Bilateral;  . MODIFIED RADICAL MASTECTOMY W/ AXILLARY LYMPH NODE DISSECTION Right 01/30/12  . PORT-A-CATH REMOVAL  06/23/2012   Procedure: MINOR REMOVAL PORT-A-CATH;  Surgeon: Shann Medal, MD;  Location: [place];  Service: General;  Laterality: Left;  . PORTACATH PLACEMENT  01/30/2012   Procedure: INSERTION PORT-A-CATH;  Surgeon: Shann Medal, MD;  Location: [place];  Service: General;  Laterality: Left;  . TRANSTHORACIC ECHOCARDIOGRAM  08-24-2012   grade I diastolic dysfunction,  ef 45-50%/  trivial MR  . TUBAL LIGATION  1980    MEDICATIONS: . acetaminophen (TYLENOL) 500 MG tablet  . amLODipine-benazepril (LOTREL) 5-20 MG capsule  . ASPERCREME LIDOCAINE EX  . aspirin 81 MG tablet  . atorvastatin (LIPITOR) 40 MG tablet  . Calcium Carb-Cholecalciferol (CALCIUM 600 + D PO)  . cephALEXin (KEFLEX) 500 MG capsule  . diphenhydrAMINE (BENADRYL) 25 MG tablet  . diphenhydramine-acetaminophen (TYLENOL PM) 25-500 MG TABS  . fluticasone (FLONASE) 50 MCG/ACT nasal spray  . ibuprofen (ADVIL,MOTRIN) 200 MG tablet  . Melatonin 3 MG TABS  . methylPREDNISolone (MEDROL DOSEPAK) 4 MG TBPK tablet  . neomycin-polymyxin-dexameth (MAXITROL) 0.1 % OINT  . prednisoLONE acetate (PRED FORTE) 1 % ophthalmic suspension  . ranitidine (ZANTAC) 150 MG tablet  . Vitamin D, Ergocalciferol, (DRISDOL) 50000 units CAPS capsule   No current facility-administered medications for this encounter.    . sodium chloride 0.9 % injection 10 mL    If no changes, I anticipate pt can proceed with surgery as scheduled.   Willeen Cass, FNP-BC [facility]/Anesthesiology Phone: [phone] 02/23/2018  3:06 PM

## 2018-02-17 ENCOUNTER — Other Ambulatory Visit (INDEPENDENT_AMBULATORY_CARE_PROVIDER_SITE_OTHER): Payer: Self-pay | Admitting: Orthopedic Surgery

## 2018-02-17 DIAGNOSIS — M1712 Unilateral primary osteoarthritis, left knee: Secondary | ICD-10-CM

## 2018-02-19 ENCOUNTER — Other Ambulatory Visit (INDEPENDENT_AMBULATORY_CARE_PROVIDER_SITE_OTHER): Payer: Self-pay

## 2018-02-19 ENCOUNTER — Telehealth (INDEPENDENT_AMBULATORY_CARE_PROVIDER_SITE_OTHER): Payer: Self-pay | Admitting: Orthopedic Surgery

## 2018-02-19 NOTE — Telephone Encounter (Signed)
Ok to do

## 2018-02-19 NOTE — Telephone Encounter (Signed)
Patient came into the clinic and wanted to know if Dr. Marlou Sa would write a work excuse note for her daughter Maureen Duncan) to be with her mother on the day of her surgery.  CB#[phone].  Thank you.

## 2018-02-20 DIAGNOSIS — E7800 Pure hypercholesterolemia, unspecified: Secondary | ICD-10-CM | POA: Diagnosis not present

## 2018-02-20 DIAGNOSIS — Z01810 Encounter for preprocedural cardiovascular examination: Secondary | ICD-10-CM | POA: Diagnosis not present

## 2018-02-20 DIAGNOSIS — I447 Left bundle-branch block, unspecified: Secondary | ICD-10-CM | POA: Diagnosis not present

## 2018-02-20 DIAGNOSIS — H00024 Hordeolum internum left upper eyelid: Secondary | ICD-10-CM | POA: Diagnosis not present

## 2018-02-20 DIAGNOSIS — Z8249 Family history of ischemic heart disease and other diseases of the circulatory system: Secondary | ICD-10-CM | POA: Diagnosis not present

## 2018-02-20 NOTE — Telephone Encounter (Signed)
Letter done, I printed copy and put up front for patient.  IC LMVM for patient advising.

## 2018-02-20 NOTE — Telephone Encounter (Signed)
y

## 2018-02-23 ENCOUNTER — Encounter (HOSPITAL_COMMUNITY): Payer: Self-pay

## 2018-02-23 MED ORDER — CEFAZOLIN SODIUM-DEXTROSE 2-4 GM/100ML-% IV SOLN
2.0000 g | INTRAVENOUS | Status: DC
  Filled 2018-02-23: qty 100

## 2018-02-23 NOTE — H&P (Signed)
TOTAL KNEE ADMISSION H&P  Patient is being admitted for left total knee arthroplasty.  Subjective:  Chief Complaint:left knee pain.  HPI: Maureen Duncan, 67 y.o. female, has a history of pain and functional disability in the left knee due to arthritis and has failed non-surgical conservative treatments for greater than 12 weeks to includeNSAID's and/or analgesics, corticosteriod injections and activity modification.  Onset of symptoms was abrupt, starting 1 years ago with rapidlly worsening course since that time. The patient noted no past surgery on the left knee(s).  Patient currently rates pain in the left knee(s) at 8 out of 10 with activity. Patient has night pain, worsening of pain with activity and weight bearing, pain that interferes with activities of daily living, pain with passive range of motion, crepitus and joint swelling.  Patient has evidence of periarticular osteophytes and joint space narrowing by imaging studies. This patient has had No evidence of non-arthritic cause of the left knee pain. There is no active infection.  Patient Active Problem List   Diagnosis Date Noted  . Prolapse of female bladder, acquired 05/08/2015  . Right elbow pain 07/25/2014  . Hyperlipidemia 04/28/2014  . Allergic rhinitis 04/28/2014  . Breast cancer, right. T2, N1. Mastectomy 01/30/2012. 01/09/2012   Past Medical History:  Diagnosis Date  . Arthritis   . Breast cancer [facility]) right breast dx 12/ 2000;  recurrence right breast 01/30/12   oncologist-  dr Marin Olp--  Stage IIb, grade 3, (T2n1M0) ductal carcinoma right breast-ER negative/ HER-2 positive---  s/p  right modified mastectomy with node dissection (3 positive) and chemotherapy (ended aug 2013) and Herceptin ended Aug 2014  . Cystocele   . GERD (gastroesophageal reflux disease)    occasionally, takes Zantac OTC when needed  . History of DVT (deep vein thrombosis)    left subclavian vein -- port of PAC--  removed 06-23-2012  . Hypertension    . LBBB (left bundle branch block)   . Lymphedema of upper extremity    right arm  . Postmenopausal osteoporosis   . Uterine prolapse   . Wears partial dentures    upper    Past Surgical History:  Procedure Laterality Date  . BREAST LUMPECTOMY Right Jan 2001  . COLONOSCOPY  last one -- June 2016   WNL  . CYSTOCELE REPAIR N/A 05/08/2015   Procedure: ANTERIOR REPAIR (CYSTOCELE);  Surgeon: Molli Posey, MD;  Location: [facility];  Service: Gynecology;  Laterality: N/A;  . LAPAROSCOPIC ASSISTED VAGINAL HYSTERECTOMY N/A 05/08/2015   Procedure: LAPAROSCOPIC ASSISTED VAGINAL HYSTERECTOMY;  Surgeon: Molli Posey, MD;  Location: [place];  Service: Gynecology;  Laterality: N/A;  . LAPAROSCOPIC BILATERAL SALPINGO OOPHERECTOMY Bilateral 05/08/2015   Procedure: LAPAROSCOPIC BILATERAL SALPINGO OOPHORECTOMY;  Surgeon: Molli Posey, MD;  Location: [place];  Service: Gynecology;  Laterality: Bilateral;  . MODIFIED RADICAL MASTECTOMY W/ AXILLARY LYMPH NODE DISSECTION Right 01/30/12  . PORT-A-CATH REMOVAL  06/23/2012   Procedure: MINOR REMOVAL PORT-A-CATH;  Surgeon: Shann Medal, MD;  Location: [place];  Service: General;  Laterality: Left;  . PORTACATH PLACEMENT  01/30/2012   Procedure: INSERTION PORT-A-CATH;  Surgeon: Shann Medal, MD;  Location: [place];  Service: General;  Laterality: Left;  . TRANSTHORACIC ECHOCARDIOGRAM  08-24-2012   grade I diastolic dysfunction,  ef 45-50%/  trivial MR  . TUBAL LIGATION  1980    Current Facility-Administered Medications  Medication Dose Route Frequency Provider Last Rate Last Dose  . [START ON 02/24/2018] ceFAZolin (  ANCEF) IVPB 2g/100 mL premix  2 g Intravenous To SS-Surg Marlou Sa Tonna Corner, MD       Current Outpatient Medications  Medication Sig Dispense Refill Last Dose  . acetaminophen (TYLENOL) 500 MG tablet Take 1,000 mg by mouth every 8 (eight) hours as needed for mild pain or  fever.    Taking  . amLODipine-benazepril (LOTREL) 5-20 MG capsule Take 1 capsule by mouth daily. 30 capsule 4   . ASPERCREME LIDOCAINE EX Apply 1 application topically 2 (two) times daily as needed (Knee pain).     Marland Kitchen aspirin 81 MG tablet Take 81 mg by mouth daily.   Taking  . atorvastatin (LIPITOR) 40 MG tablet Take 40 mg by mouth daily.     . Calcium Carb-Cholecalciferol (CALCIUM 600 + D PO) Take 1 tablet by mouth daily.     . cephALEXin (KEFLEX) 500 MG capsule Take 500 mg by mouth 2 (two) times daily.     . diphenhydrAMINE (BENADRYL) 25 MG tablet Take 50 mg by mouth daily as needed for itching or allergies.    Taking  . diphenhydramine-acetaminophen (TYLENOL PM) 25-500 MG TABS Take 1 tablet by mouth at bedtime as needed (sleep).    Taking  . fluticasone (FLONASE) 50 MCG/ACT nasal spray PLACE 2 SPRAYS INTO BOTH NOSTRILS AS NEEDED. 16 g 3   . ibuprofen (ADVIL,MOTRIN) 200 MG tablet Take 600 mg by mouth every 8 (eight) hours as needed for mild pain or moderate pain.    Taking  . Melatonin 3 MG TABS Take 2 tablets by mouth at bedtime as needed (sleep).    Taking  . methylPREDNISolone (MEDROL DOSEPAK) 4 MG TBPK tablet Take 4-32 mg by mouth See admin instructions. 6-day taper pack     . neomycin-polymyxin-dexameth (MAXITROL) 0.1 % OINT Place 1 application into the left eye at bedtime.     . prednisoLONE acetate (PRED FORTE) 1 % ophthalmic suspension Place 1 drop into both eyes 2 (two) times daily.     . ranitidine (ZANTAC) 150 MG tablet Take 150 mg by mouth daily as needed for heartburn.     . Vitamin D, Ergocalciferol, (DRISDOL) 50000 units CAPS capsule Take 1 capsule (50,000 Units total) by mouth every 7 (seven) days. (Patient taking differently: Take 50,000 Units by mouth 2 (two) times a week. ) 20 capsule 3    Facility-Administered Medications Ordered in Other Encounters  Medication Dose Route Frequency Provider Last Rate Last Dose  . sodium chloride 0.9 % injection 10 mL  10 mL Intravenous PRN  Volanda Napoleon, MD   10 mL at 06/17/12 1651   Allergies  Allergen Reactions  . Adhesive [Tape] Rash    Social History   Tobacco Use  . Smoking status: Former Smoker    Packs/day: 0.25    Years: 2.00    Pack years: 0.50    Types: Cigarettes    Start date: 03/31/2010    Last attempt to quit: 11/11/2011    Years since quitting: 6.2  . Smokeless tobacco: Never Used  . Tobacco comment: social smoker  Substance Use Topics  . Alcohol use: Yes    Alcohol/week: 0.0 oz    Comment: occasional    Family History  Problem Relation Age of Onset  . Diabetes Mother   . Hypertension Mother   . Kidney failure Mother   . Kidney disease Mother   . Cancer Mother        pancreatic  . Other Brother  heart transplant  . Cancer Maternal Uncle        lung     Review of Systems  Musculoskeletal: Positive for joint pain.  All other systems reviewed and are negative.   Objective:  Physical Exam  Constitutional: She appears well-developed.  HENT:  Head: Normocephalic.  Eyes: Pupils are equal, round, and reactive to light.  Neck: Normal range of motion.  Cardiovascular: Normal rate.  Respiratory: Effort normal.  Neurological: She is alert.  Skin: Skin is warm.  Psychiatric: She has a normal mood and affect.  Orthopedic examination of the left knee demonstrates palpable pedal pulses.  Extensor mechanism is intact.  Collateral and cruciate ligaments are stable.  No groin pain with internal/external rotation of the leg.  She has medial greater than lateral joint line tenderness.  Range of motion is to about 110 of flexion from 0 degrees of extension -  mild effusion present.  Vital signs in last 24 hours:    Labs:   Estimated body mass index is 30.67 kg/m as calculated from the following:   Height as of 02/16/18: 5' 5"  (1.651 m).   Weight as of 02/16/18: 184 lb 4.8 oz (83.6 kg).   Imaging Review Plain radiographs demonstrate moderate degenerative joint disease of the left  knee(s). The overall alignment ismild varus. The bone quality appears to be good for age and reported activity level.   Preoperative templating of the joint replacement has been completed, documented, and submitted to the Operating Room personnel in order to optimize intra-operative equipment management.   Anticipated LOS equal to or greater than 2 midnights due to - Age 84 and older with one or more of the following:  - Obesity  - Expected need for hospital services (PT, OT, Nursing) required for safe  discharge  - Anticipated need for postoperative skilled nursing care or inpatient rehab  - Active co-morbidities: History of breast cancer treatment x2 OR   - Unanticipated findings during/Post Surgery: None  - Patient is a high risk of re-admission due to: None     Assessment/Plan:  End stage arthritis, left knee   The patient history, physical examination, clinical judgment of the provider and imaging studies are consistent with end stage degenerative joint disease of the left knee(s) and total knee arthroplasty is deemed medically necessary. The treatment options including medical management, injection therapy arthroscopy and arthroplasty were discussed at length. The risks and benefits of total knee arthroplasty were presented and reviewed. The risks due to aseptic loosening, infection, stiffness, patella tracking problems, thromboembolic complications and other imponderables were discussed. The patient acknowledged the explanation, agreed to proceed with the plan and consent was signed. Patient is being admitted for inpatient treatment for surgery, pain control, PT, OT, prophylactic antibiotics, VTE prophylaxis, progressive ambulation and ADL's and discharge planning. The patient is planning to be discharged home with home health services patient does report having a blood clot associated with her Port-A-Cath.  This resolved with Port-A-Cath removal.  Nonetheless because of this we will  use topical TXA but not intravenous TXA.

## 2018-02-23 NOTE — Anesthesia Preprocedure Evaluation (Addendum)
Anesthesia Evaluation  Patient identified by MRN, date of birth, ID band Patient awake    Reviewed: Allergy & Precautions, H&P , NPO status , Patient's Chart, lab work & pertinent test results  History of Anesthesia Complications Negative for: history of anesthetic complications  Airway Mallampati: II  TM Distance: >3 FB Neck ROM: Full    Dental  (+) Dental Advisory Given, Partial Upper   Pulmonary former smoker,    Pulmonary exam normal breath sounds clear to auscultation       Cardiovascular hypertension, Pt. on medications Normal cardiovascular exam+ dysrhythmias  Rhythm:Regular Rate:Normal   EKG 02/16/18: NSR.  Ventricular pre-excitation, WPW pattern type B  Echo 01/17/16  1.  LV cavity normal in size.  Abnormal septal wall motion due to LBBB.  Doppler evidence of grade high diastolic dysfunction.  Borderline normal systolic function.  Calculated EF 51%. 2.  Trace mitral regurgitation. 3.  Mild tricuspid regurgitation.  No evidence of pulmonary hypertension  Nuclear stress test 12/29/2015 1.  Resting EKG demonstrated NSR, LBBB, and no resting arrhythmias.  Stress EKG nondiagnostic for ischemia as a pharmacologic stress using Lexiscan.  Stress symptoms included dyspnea. 2.  Stress and rest SPECT images demonstrate homogeneous tracer distribution throughout the myocardium.  SPECT imaging reveals normal myocardial thickening and wall motion.  LVEF was normal visually, but calculated at 45%.  This is a low risk study.      Neuro/Psych negative neurological ROS  negative psych ROS   GI/Hepatic Neg liver ROS, GERD  Medicated and Controlled,  Endo/Other  Obesity   Renal/GU negative Renal ROS  negative genitourinary   Musculoskeletal  (+) Arthritis , Osteoarthritis,    Abdominal   Peds negative pediatric ROS (+)  Hematology negative hematology ROS (+) Plt 276k   Anesthesia Other Findings  - Right arm precautions  given history of breast cancer and axillary lymph node removals, s/p chemo, history of DVT related to that time period   Reproductive/Obstetrics negative OB ROS                         Anesthesia Physical  Anesthesia Plan  ASA: II  Anesthesia Plan: Spinal   Post-op Pain Management:  Regional for Post-op pain   Induction: Intravenous  PONV Risk Score and Plan: 2 and Ondansetron, Dexamethasone, Treatment may vary due to age or medical condition and Propofol infusion  Airway Management Planned: Simple Face Mask and Natural Airway  Additional Equipment:   Intra-op Plan:   Post-operative Plan:   Informed Consent: I have reviewed the patients History and Physical, chart, labs and discussed the procedure including the risks, benefits and alternatives for the proposed anesthesia with the patient or authorized representative who has indicated his/her understanding and acceptance.   Dental advisory given  Plan Discussed with: CRNA  Anesthesia Plan Comments: (  )     Anesthesia Quick Evaluation

## 2018-02-24 ENCOUNTER — Encounter (HOSPITAL_COMMUNITY): Payer: Self-pay | Admitting: *Deleted

## 2018-02-24 ENCOUNTER — Encounter (HOSPITAL_COMMUNITY)
Admission: RE | Disposition: A | Discharge status: Home / Self Care | Payer: Self-pay | Source: Ambulatory Visit | Attending: Orthopedic Surgery

## 2018-02-24 ENCOUNTER — Ambulatory Visit (HOSPITAL_COMMUNITY)
Admission: RE | Admit: 2018-02-24 | Discharge: 2018-02-24 | Disposition: A | Discharge status: Home / Self Care | Payer: Federal, State, Local not specified - PPO | Source: Ambulatory Visit | Attending: Orthopedic Surgery | Admitting: Orthopedic Surgery

## 2018-02-24 DIAGNOSIS — Z90710 Acquired absence of both cervix and uterus: Secondary | ICD-10-CM | POA: Diagnosis not present

## 2018-02-24 DIAGNOSIS — Z9011 Acquired absence of right breast and nipple: Secondary | ICD-10-CM | POA: Insufficient documentation

## 2018-02-24 DIAGNOSIS — Z79899 Other long term (current) drug therapy: Secondary | ICD-10-CM | POA: Insufficient documentation

## 2018-02-24 DIAGNOSIS — Z6830 Body mass index (BMI) 30.0-30.9, adult: Secondary | ICD-10-CM | POA: Diagnosis not present

## 2018-02-24 DIAGNOSIS — Z8249 Family history of ischemic heart disease and other diseases of the circulatory system: Secondary | ICD-10-CM | POA: Insufficient documentation

## 2018-02-24 DIAGNOSIS — L0291 Cutaneous abscess, unspecified: Secondary | ICD-10-CM | POA: Insufficient documentation

## 2018-02-24 DIAGNOSIS — Z7982 Long term (current) use of aspirin: Secondary | ICD-10-CM | POA: Diagnosis not present

## 2018-02-24 DIAGNOSIS — I10 Essential (primary) hypertension: Secondary | ICD-10-CM | POA: Insufficient documentation

## 2018-02-24 DIAGNOSIS — Z800 Family history of malignant neoplasm of digestive organs: Secondary | ICD-10-CM | POA: Diagnosis not present

## 2018-02-24 DIAGNOSIS — Z853 Personal history of malignant neoplasm of breast: Secondary | ICD-10-CM | POA: Insufficient documentation

## 2018-02-24 DIAGNOSIS — Z9221 Personal history of antineoplastic chemotherapy: Secondary | ICD-10-CM | POA: Diagnosis not present

## 2018-02-24 DIAGNOSIS — E785 Hyperlipidemia, unspecified: Secondary | ICD-10-CM | POA: Diagnosis not present

## 2018-02-24 DIAGNOSIS — M25562 Pain in left knee: Secondary | ICD-10-CM | POA: Diagnosis present

## 2018-02-24 DIAGNOSIS — Z5309 Procedure and treatment not carried out because of other contraindication: Secondary | ICD-10-CM | POA: Diagnosis not present

## 2018-02-24 DIAGNOSIS — M1712 Unilateral primary osteoarthritis, left knee: Secondary | ICD-10-CM | POA: Diagnosis not present

## 2018-02-24 DIAGNOSIS — E669 Obesity, unspecified: Secondary | ICD-10-CM | POA: Diagnosis not present

## 2018-02-24 DIAGNOSIS — Z841 Family history of disorders of kidney and ureter: Secondary | ICD-10-CM | POA: Diagnosis not present

## 2018-02-24 DIAGNOSIS — Z86718 Personal history of other venous thrombosis and embolism: Secondary | ICD-10-CM | POA: Diagnosis not present

## 2018-02-24 DIAGNOSIS — Z833 Family history of diabetes mellitus: Secondary | ICD-10-CM | POA: Insufficient documentation

## 2018-02-24 DIAGNOSIS — Z87891 Personal history of nicotine dependence: Secondary | ICD-10-CM | POA: Insufficient documentation

## 2018-02-24 DIAGNOSIS — K219 Gastro-esophageal reflux disease without esophagitis: Secondary | ICD-10-CM | POA: Insufficient documentation

## 2018-02-24 DIAGNOSIS — Z90722 Acquired absence of ovaries, bilateral: Secondary | ICD-10-CM | POA: Insufficient documentation

## 2018-02-24 SURGERY — ARTHROPLASTY, KNEE, TOTAL
Anesthesia: Spinal | Laterality: Left

## 2018-02-24 MED ORDER — LACTATED RINGERS IV SOLN
INTRAVENOUS | Status: AC | PRN
  Administered 2018-02-24 – 2018-04-07 (×3): via INTRAVENOUS

## 2018-02-24 MED ORDER — MIDAZOLAM HCL 2 MG/2ML IJ SOLN
INTRAMUSCULAR | Status: AC
  Filled 2018-02-24: qty 2

## 2018-02-24 MED ORDER — FENTANYL CITRATE (PF) 250 MCG/5ML IJ SOLN
INTRAMUSCULAR | Status: AC
  Filled 2018-02-24: qty 5

## 2018-02-24 MED ORDER — MORPHINE SULFATE (PF) 4 MG/ML IV SOLN
INTRAVENOUS | Status: AC
  Filled 2018-02-24: qty 1

## 2018-02-24 MED ORDER — FENTANYL CITRATE (PF) 100 MCG/2ML IJ SOLN
INTRAMUSCULAR | Status: AC | PRN
  Administered 2018-02-24 (×2): 50 ug via INTRAVENOUS

## 2018-02-24 MED ORDER — BUPIVACAINE HCL (PF) 0.5 % IJ SOLN
INTRAMUSCULAR | Status: AC
  Filled 2018-02-24: qty 30

## 2018-02-24 MED ORDER — CHLORHEXIDINE GLUCONATE 4 % EX LIQD: 60.0000 mL | Freq: Once | CUTANEOUS | Status: DC

## 2018-02-24 MED ORDER — PROPOFOL 10 MG/ML IV BOLUS
INTRAVENOUS | Status: AC
  Filled 2018-02-24: qty 20

## 2018-02-24 MED ORDER — MIDAZOLAM HCL 5 MG/5ML IJ SOLN
INTRAMUSCULAR | Status: AC | PRN
  Administered 2018-02-24: 2 mg via INTRAVENOUS

## 2018-02-24 MED ORDER — LIDOCAINE 2% (20 MG/ML) 5 ML SYRINGE
INTRAMUSCULAR | Status: AC
  Filled 2018-02-24: qty 5

## 2018-02-24 NOTE — Progress Notes (Signed)
Patient has been on monitor with NSR since block was performed. Sp O2 100% on RA

## 2018-02-24 NOTE — Progress Notes (Signed)
Discharge instructions reviewed with patient and family. Daughter states she will remain with patient until tomorrow morning. Patient and daughter verbalized understanding knee immobilizer use, information on peripheral nerve block, and instructions on sedation received for block

## 2018-02-24 NOTE — Progress Notes (Signed)
Patient has high abscess.  She has not yet completed her antibiotic course.  She is not having any fevers or chills but on examination there is induration above her eye.  There is some redness as well.  She has finished her course of steroids but is on day 6 of antibiotics.  The eye itself would probably be okay but because of the induration and the patient states there has been some drainage yesterday I think it would be better to hold off on knee replacement for 3 to 4 weeks.  I will see her back in 2 weeks to make sure this is healing up.  All this is explained to the patient.  In general we would want optimal conditions for joint replacement.  Because of the rim of induration around this area of granulation tissue and because of the continued swelling I think that there is still the potential for some circulation of bacteria within the body.  I think it would be best to wait several weeks until this is more fully resolved.

## 2018-02-24 NOTE — Progress Notes (Signed)
Patient assisted to car via wheelchair

## 2018-02-24 NOTE — OR Nursing (Signed)
Case canceled. Patient in holding area.

## 2018-03-02 DIAGNOSIS — S00212A Abrasion of left eyelid and periocular area, initial encounter: Secondary | ICD-10-CM | POA: Diagnosis not present

## 2018-03-06 ENCOUNTER — Other Ambulatory Visit (INDEPENDENT_AMBULATORY_CARE_PROVIDER_SITE_OTHER): Payer: Self-pay | Admitting: Orthopedic Surgery

## 2018-03-06 DIAGNOSIS — M1712 Unilateral primary osteoarthritis, left knee: Secondary | ICD-10-CM

## 2018-03-27 ENCOUNTER — Encounter (HOSPITAL_COMMUNITY)
Admission: RE | Admit: 2018-03-27 | Discharge: 2018-03-27 | Disposition: A | Discharge status: Home / Self Care | Payer: Federal, State, Local not specified - PPO | Source: Ambulatory Visit | Attending: Orthopedic Surgery | Admitting: Orthopedic Surgery

## 2018-03-27 ENCOUNTER — Other Ambulatory Visit: Payer: Self-pay

## 2018-03-27 ENCOUNTER — Encounter (HOSPITAL_COMMUNITY): Payer: Self-pay

## 2018-03-27 DIAGNOSIS — Z0183 Encounter for blood typing: Secondary | ICD-10-CM | POA: Diagnosis not present

## 2018-03-27 DIAGNOSIS — Z01812 Encounter for preprocedural laboratory examination: Secondary | ICD-10-CM | POA: Insufficient documentation

## 2018-03-27 LAB — URINALYSIS, ROUTINE W REFLEX MICROSCOPIC
BACTERIA UA: NONE SEEN
BILIRUBIN URINE: NEGATIVE
Glucose, UA: NEGATIVE mg/dL
Ketones, ur: NEGATIVE mg/dL
Leukocytes, UA: NEGATIVE
NITRITE: NEGATIVE
PH: 6 (ref 5.0–8.0)
Protein, ur: NEGATIVE mg/dL
SPECIFIC GRAVITY, URINE: 1.006 (ref 1.005–1.030)

## 2018-03-27 LAB — SURGICAL PCR SCREEN
MRSA, PCR: NEGATIVE
Staphylococcus aureus: NEGATIVE

## 2018-03-27 LAB — CBC
HEMATOCRIT: 39 % (ref 36.0–46.0)
Hemoglobin: 12.4 g/dL (ref 12.0–15.0)
MCH: 30 pg (ref 26.0–34.0)
MCHC: 31.8 g/dL (ref 30.0–36.0)
MCV: 94.2 fL (ref 78.0–100.0)
Platelets: 276 10*3/uL (ref 150–400)
RBC: 4.14 MIL/uL (ref 3.87–5.11)
RDW: 13.1 % (ref 11.5–15.5)
WBC: 7.3 10*3/uL (ref 4.0–10.5)

## 2018-03-27 LAB — TYPE AND SCREEN
ABO/RH(D): O POS
ANTIBODY SCREEN: NEGATIVE

## 2018-03-27 LAB — BASIC METABOLIC PANEL
ANION GAP: 10 (ref 5–15)
BUN: 13 mg/dL (ref 6–20)
CO2: 24 mmol/L (ref 22–32)
Calcium: 10 mg/dL (ref 8.9–10.3)
Chloride: 105 mmol/L (ref 101–111)
Creatinine, Ser: 0.93 mg/dL (ref 0.44–1.00)
Glucose, Bld: 78 mg/dL (ref 65–99)
Potassium: 3.6 mmol/L (ref 3.5–5.1)
SODIUM: 139 mmol/L (ref 135–145)

## 2018-03-27 LAB — ABO/RH: ABO/RH(D): O POS

## 2018-03-27 NOTE — Progress Notes (Signed)
Pt stated that she does not take Aspirin.

## 2018-03-27 NOTE — Pre-Procedure Instructions (Addendum)
   INOLA LISLE  03/27/2018     [id number] [place], [place] - [address] [facility] [address] [place] [address] Phone: [phone] Fax: [place], [place] [facility] [address] [place] [address] Phone: [phone] Fax: [phone]   Your procedure is scheduled on Tuesday, April 07, 2018  Report to [facility] Admitting at 5:30 A.M.  Call this number if you have problems the morning of surgery:  [phone]   Remember: Follow your surgeon's instructions regarding Aspirin.   Do not eat food or drink liquids after midnight Monday, April 06, 2018  Take these medicines the morning of surgery with A SIP OF WATER: if needed: Acetaminophen (tylenol) for pain, Ranitidine (Zantac) for heartburn, fluticasone (FLONASE)  nasal spray, prednisoLONE acetate (PRED FORTE) eye drops for eye infection  Stop taking vitamins, fish oil, Melatonin and herbal medications. Do not take any NSAIDs ie: Ibuprofen, Advil, Naproxen or any medication containing Aspirin; stop Tuesday, March 31, 2018  Do not wear jewelry, make-up or nail polish.  Do not wear lotions, powders, or perfumes, or deodorant.  Do not shave 48 hours prior to surgery.    Do not bring valuables to the hospital.  [facility] is not responsible for any belongings or valuables.  Contacts, dentures or bridgework may not be worn into surgery.  Leave your suitcase in the car.  After surgery it may be brought to your room. Special instructions: Shower the night before surgery and the morning of surgery with CHG. Please read over the following fact sheets that you were given. Pain Booklet, Coughing and Deep Breathing, Total Joint Packet, MRSA Information and Surgical Site Infection Prevention

## 2018-03-27 NOTE — Progress Notes (Signed)
Pt denies SOB, chest pain, and being under the care of a cardiologist. Pt denies having a cardiac cath. Pt denies having a chest x ray within the last year. Pt denies recent labs. See anesthesia note from 02/16/18.

## 2018-03-27 NOTE — Progress Notes (Signed)
Pt denies SOB, chest pain, and being under the care of a cardiologist. Pt denies having a cardiac cath. Pt denies having a chest x ray within the last year. Pt denies recent labs. Pt stated that she does not take Aspirin. See anesthesia note 02/16/18.

## 2018-03-28 LAB — URINE CULTURE: Culture: <10000 — AB

## 2018-04-06 ENCOUNTER — Other Ambulatory Visit (INDEPENDENT_AMBULATORY_CARE_PROVIDER_SITE_OTHER): Payer: Self-pay

## 2018-04-06 MED ORDER — CEFAZOLIN SODIUM-DEXTROSE 2-4 GM/100ML-% IV SOLN
2.0000 g | INTRAVENOUS | Status: AC
  Administered 2018-04-07: 2 g via INTRAVENOUS
  Filled 2018-04-06: qty 100

## 2018-04-06 MED ORDER — BUPIVACAINE LIPOSOME 1.3 % IJ SUSP
20.0000 mL | INTRAMUSCULAR | Status: DC
  Filled 2018-04-06: qty 20

## 2018-04-07 ENCOUNTER — Encounter (HOSPITAL_COMMUNITY): Payer: Self-pay | Admitting: *Deleted

## 2018-04-07 ENCOUNTER — Inpatient Hospital Stay (HOSPITAL_COMMUNITY): Payer: Federal, State, Local not specified - PPO | Admitting: Emergency Medicine

## 2018-04-07 ENCOUNTER — Other Ambulatory Visit: Payer: Self-pay

## 2018-04-07 ENCOUNTER — Inpatient Hospital Stay (HOSPITAL_COMMUNITY): Payer: Federal, State, Local not specified - PPO | Admitting: Certified Registered"

## 2018-04-07 ENCOUNTER — Inpatient Hospital Stay (HOSPITAL_COMMUNITY)
Admission: RE | Admit: 2018-04-07 | Discharge: 2018-04-10 | DRG: 470 | Disposition: A | Discharge status: Home Health | Payer: Federal, State, Local not specified - PPO | Attending: Orthopedic Surgery | Admitting: Orthopedic Surgery

## 2018-04-07 ENCOUNTER — Encounter (HOSPITAL_COMMUNITY)
Admission: RE | Disposition: A | Discharge status: Home Health | Payer: Self-pay | Source: Home / Self Care | Attending: Orthopedic Surgery

## 2018-04-07 DIAGNOSIS — Z800 Family history of malignant neoplasm of digestive organs: Secondary | ICD-10-CM

## 2018-04-07 DIAGNOSIS — Z9221 Personal history of antineoplastic chemotherapy: Secondary | ICD-10-CM | POA: Diagnosis not present

## 2018-04-07 DIAGNOSIS — E669 Obesity, unspecified: Secondary | ICD-10-CM | POA: Diagnosis present

## 2018-04-07 DIAGNOSIS — M810 Age-related osteoporosis without current pathological fracture: Secondary | ICD-10-CM | POA: Diagnosis not present

## 2018-04-07 DIAGNOSIS — Z6830 Body mass index (BMI) 30.0-30.9, adult: Secondary | ICD-10-CM | POA: Diagnosis not present

## 2018-04-07 DIAGNOSIS — Z87891 Personal history of nicotine dependence: Secondary | ICD-10-CM | POA: Diagnosis not present

## 2018-04-07 DIAGNOSIS — Z96651 Presence of right artificial knee joint: Secondary | ICD-10-CM | POA: Diagnosis not present

## 2018-04-07 DIAGNOSIS — G8918 Other acute postprocedural pain: Secondary | ICD-10-CM | POA: Diagnosis not present

## 2018-04-07 DIAGNOSIS — Z90722 Acquired absence of ovaries, bilateral: Secondary | ICD-10-CM | POA: Diagnosis not present

## 2018-04-07 DIAGNOSIS — I10 Essential (primary) hypertension: Secondary | ICD-10-CM | POA: Diagnosis present

## 2018-04-07 DIAGNOSIS — Z9011 Acquired absence of right breast and nipple: Secondary | ICD-10-CM | POA: Diagnosis not present

## 2018-04-07 DIAGNOSIS — M1711 Unilateral primary osteoarthritis, right knee: Secondary | ICD-10-CM | POA: Diagnosis not present

## 2018-04-07 DIAGNOSIS — Z801 Family history of malignant neoplasm of trachea, bronchus and lung: Secondary | ICD-10-CM

## 2018-04-07 DIAGNOSIS — E785 Hyperlipidemia, unspecified: Secondary | ICD-10-CM | POA: Diagnosis present

## 2018-04-07 DIAGNOSIS — M1712 Unilateral primary osteoarthritis, left knee: Secondary | ICD-10-CM | POA: Diagnosis not present

## 2018-04-07 DIAGNOSIS — Z8249 Family history of ischemic heart disease and other diseases of the circulatory system: Secondary | ICD-10-CM | POA: Diagnosis not present

## 2018-04-07 DIAGNOSIS — Z86718 Personal history of other venous thrombosis and embolism: Secondary | ICD-10-CM

## 2018-04-07 DIAGNOSIS — Z972 Presence of dental prosthetic device (complete) (partial): Secondary | ICD-10-CM | POA: Diagnosis not present

## 2018-04-07 DIAGNOSIS — K219 Gastro-esophageal reflux disease without esophagitis: Secondary | ICD-10-CM | POA: Diagnosis not present

## 2018-04-07 DIAGNOSIS — M1710 Unilateral primary osteoarthritis, unspecified knee: Secondary | ICD-10-CM | POA: Diagnosis present

## 2018-04-07 DIAGNOSIS — Z853 Personal history of malignant neoplasm of breast: Secondary | ICD-10-CM

## 2018-04-07 HISTORY — PX: TOTAL KNEE ARTHROPLASTY: SHX125

## 2018-04-07 SURGERY — ARTHROPLASTY, KNEE, TOTAL
Anesthesia: Monitor Anesthesia Care | Site: Leg Lower | Laterality: Left

## 2018-04-07 MED ORDER — LACTATED RINGERS IV SOLN
INTRAVENOUS | Status: AC
  Administered 2018-04-07: 16:00:00 via INTRAVENOUS

## 2018-04-07 MED ORDER — GABAPENTIN 300 MG PO CAPS
300.0000 mg | ORAL_CAPSULE | Freq: Three times a day (TID) | ORAL | Status: DC
  Administered 2018-04-07 – 2018-04-10 (×9): 300 mg via ORAL
  Filled 2018-04-07 (×9): qty 1

## 2018-04-07 MED ORDER — CEFAZOLIN SODIUM-DEXTROSE 2-4 GM/100ML-% IV SOLN
2.0000 g | Freq: Four times a day (QID) | INTRAVENOUS | Status: AC
  Administered 2018-04-07 (×2): 2 g via INTRAVENOUS
  Filled 2018-04-07 (×2): qty 100

## 2018-04-07 MED ORDER — PHENYLEPHRINE 40 MCG/ML (10ML) SYRINGE FOR IV PUSH (FOR BLOOD PRESSURE SUPPORT)
PREFILLED_SYRINGE | INTRAVENOUS | Status: AC
  Filled 2018-04-07: qty 10

## 2018-04-07 MED ORDER — PHENYLEPHRINE 40 MCG/ML (10ML) SYRINGE FOR IV PUSH (FOR BLOOD PRESSURE SUPPORT)
PREFILLED_SYRINGE | INTRAVENOUS | Status: DC | PRN
  Administered 2018-04-07: 80 ug via INTRAVENOUS
  Administered 2018-04-07: 120 ug via INTRAVENOUS

## 2018-04-07 MED ORDER — ROPIVACAINE HCL 7.5 MG/ML IJ SOLN
INTRAMUSCULAR | Status: DC | PRN
  Administered 2018-04-07: 15 mL via PERINEURAL

## 2018-04-07 MED ORDER — FAMOTIDINE 20 MG PO TABS
20.0000 mg | ORAL_TABLET | Freq: Every day | ORAL | Status: DC
  Administered 2018-04-08 – 2018-04-10 (×3): 20 mg via ORAL
  Filled 2018-04-07 (×3): qty 1

## 2018-04-07 MED ORDER — HYDROMORPHONE HCL 1 MG/ML IJ SOLN
0.5000 mg | INTRAMUSCULAR | Status: DC | PRN
  Administered 2018-04-07: 1 mg via INTRAVENOUS

## 2018-04-07 MED ORDER — FENTANYL CITRATE (PF) 100 MCG/2ML IJ SOLN
INTRAMUSCULAR | Status: AC
  Filled 2018-04-07: qty 2

## 2018-04-07 MED ORDER — LIDOCAINE 2% (20 MG/ML) 5 ML SYRINGE
INTRAMUSCULAR | Status: AC
  Filled 2018-04-07: qty 20

## 2018-04-07 MED ORDER — PREDNISOLONE ACETATE 1 % OP SUSP: 1.0000 [drp] | Freq: Two times a day (BID) | OPHTHALMIC | Status: DC | PRN

## 2018-04-07 MED ORDER — FENTANYL CITRATE (PF) 250 MCG/5ML IJ SOLN
INTRAMUSCULAR | Status: DC | PRN
  Administered 2018-04-07 (×2): 50 ug via INTRAVENOUS

## 2018-04-07 MED ORDER — ACETAMINOPHEN 325 MG PO TABS: 325.0000 mg | ORAL_TABLET | ORAL | Status: DC | PRN

## 2018-04-07 MED ORDER — MENTHOL 3 MG MT LOZG: 1.0000 | LOZENGE | OROMUCOSAL | Status: DC | PRN

## 2018-04-07 MED ORDER — HYDROMORPHONE HCL 2 MG/ML IJ SOLN
INTRAMUSCULAR | Status: AC
  Filled 2018-04-07: qty 1

## 2018-04-07 MED ORDER — BUPIVACAINE IN DEXTROSE 0.75-8.25 % IT SOLN
INTRATHECAL | Status: DC | PRN
  Administered 2018-04-07: 1.8 mL via INTRATHECAL

## 2018-04-07 MED ORDER — PROPOFOL 10 MG/ML IV BOLUS
INTRAVENOUS | Status: DC | PRN
  Administered 2018-04-07 (×3): 20 mg via INTRAVENOUS

## 2018-04-07 MED ORDER — METHOCARBAMOL 500 MG PO TABS
500.0000 mg | ORAL_TABLET | Freq: Four times a day (QID) | ORAL | Status: DC | PRN
  Administered 2018-04-07 – 2018-04-10 (×8): 500 mg via ORAL
  Filled 2018-04-07 (×8): qty 1

## 2018-04-07 MED ORDER — PHENYLEPHRINE HCL 10 MG/ML IJ SOLN
INTRAVENOUS | Status: DC | PRN
  Administered 2018-04-07: 20 ug/min via INTRAVENOUS

## 2018-04-07 MED ORDER — SODIUM CHLORIDE 0.9 % IV SOLN
2000.0000 mg | INTRAVENOUS | Status: AC
  Administered 2018-04-07: 2000 mg via TOPICAL
  Filled 2018-04-07: qty 20

## 2018-04-07 MED ORDER — ONDANSETRON HCL 4 MG/2ML IJ SOLN
INTRAMUSCULAR | Status: AC
  Filled 2018-04-07: qty 2

## 2018-04-07 MED ORDER — METHOCARBAMOL 1000 MG/10ML IJ SOLN
500.0000 mg | Freq: Four times a day (QID) | INTRAMUSCULAR | Status: DC | PRN
  Filled 2018-04-07: qty 5

## 2018-04-07 MED ORDER — PROPOFOL 500 MG/50ML IV EMUL
INTRAVENOUS | Status: DC | PRN
  Administered 2018-04-07: 75 ug/kg/min via INTRAVENOUS

## 2018-04-07 MED ORDER — ONDANSETRON HCL 4 MG/2ML IJ SOLN
4.0000 mg | Freq: Four times a day (QID) | INTRAMUSCULAR | Status: DC | PRN
  Administered 2018-04-07: 4 mg via INTRAVENOUS
  Filled 2018-04-07: qty 2

## 2018-04-07 MED ORDER — HYDROMORPHONE HCL 2 MG/ML IJ SOLN
0.5000 mg | INTRAMUSCULAR | Status: DC | PRN
  Administered 2018-04-07 (×2): 1 mg via INTRAVENOUS
  Filled 2018-04-07 (×2): qty 1

## 2018-04-07 MED ORDER — FLUTICASONE PROPIONATE 50 MCG/ACT NA SUSP
2.0000 | Freq: Every day | NASAL | Status: DC | PRN
  Filled 2018-04-07: qty 16

## 2018-04-07 MED ORDER — MORPHINE SULFATE (PF) 4 MG/ML IV SOLN
INTRAVENOUS | Status: DC | PRN
  Administered 2018-04-07: 8 mg via SUBCUTANEOUS

## 2018-04-07 MED ORDER — OXYCODONE HCL 5 MG PO TABS: 5.0000 mg | ORAL_TABLET | Freq: Once | ORAL | Status: DC | PRN

## 2018-04-07 MED ORDER — BENAZEPRIL HCL 20 MG PO TABS
20.0000 mg | ORAL_TABLET | Freq: Every day | ORAL | Status: DC
  Administered 2018-04-08 – 2018-04-10 (×3): 20 mg via ORAL
  Filled 2018-04-07 (×3): qty 1

## 2018-04-07 MED ORDER — DOCUSATE SODIUM 100 MG PO CAPS
100.0000 mg | ORAL_CAPSULE | Freq: Two times a day (BID) | ORAL | Status: DC
  Administered 2018-04-07 – 2018-04-10 (×6): 100 mg via ORAL
  Filled 2018-04-07 (×6): qty 1

## 2018-04-07 MED ORDER — METOCLOPRAMIDE HCL 5 MG PO TABS: 5.0000 mg | ORAL_TABLET | Freq: Three times a day (TID) | ORAL | Status: DC | PRN

## 2018-04-07 MED ORDER — OXYCODONE HCL 5 MG/5ML PO SOLN: 5.0000 mg | Freq: Once | ORAL | Status: DC | PRN

## 2018-04-07 MED ORDER — OXYCODONE HCL 5 MG PO TABS
5.0000 mg | ORAL_TABLET | ORAL | Status: DC | PRN
  Administered 2018-04-07 – 2018-04-09 (×12): 10 mg via ORAL
  Administered 2018-04-09: 5 mg via ORAL
  Administered 2018-04-10 (×3): 10 mg via ORAL
  Filled 2018-04-07 (×15): qty 2

## 2018-04-07 MED ORDER — 0.9 % SODIUM CHLORIDE (POUR BTL) OPTIME
TOPICAL | Status: DC | PRN
  Administered 2018-04-07: 1000 mL

## 2018-04-07 MED ORDER — BUPIVACAINE-EPINEPHRINE (PF) 0.5% -1:200000 IJ SOLN
INTRAMUSCULAR | Status: AC
  Filled 2018-04-07: qty 30

## 2018-04-07 MED ORDER — ACETAMINOPHEN 325 MG PO TABS
325.0000 mg | ORAL_TABLET | Freq: Four times a day (QID) | ORAL | Status: DC | PRN
  Administered 2018-04-08 (×2): 650 mg via ORAL
  Filled 2018-04-07 (×3): qty 2

## 2018-04-07 MED ORDER — METOCLOPRAMIDE HCL 5 MG/ML IJ SOLN
5.0000 mg | Freq: Three times a day (TID) | INTRAMUSCULAR | Status: DC | PRN
  Administered 2018-04-07: 5 mg via INTRAVENOUS
  Filled 2018-04-07: qty 2

## 2018-04-07 MED ORDER — PROPOFOL 10 MG/ML IV BOLUS
INTRAVENOUS | Status: AC
  Filled 2018-04-07: qty 20

## 2018-04-07 MED ORDER — MORPHINE SULFATE (PF) 4 MG/ML IV SOLN
INTRAVENOUS | Status: AC
  Filled 2018-04-07: qty 2

## 2018-04-07 MED ORDER — ATORVASTATIN CALCIUM 40 MG PO TABS
40.0000 mg | ORAL_TABLET | Freq: Every day | ORAL | Status: DC
  Administered 2018-04-08 – 2018-04-10 (×3): 40 mg via ORAL
  Filled 2018-04-07 (×3): qty 1

## 2018-04-07 MED ORDER — CLONIDINE HCL (ANALGESIA) 100 MCG/ML EP SOLN
150.0000 ug | Freq: Once | EPIDURAL | Status: DC
  Filled 2018-04-07: qty 10

## 2018-04-07 MED ORDER — SODIUM CHLORIDE 0.9% FLUSH
INTRAVENOUS | Status: DC | PRN
  Administered 2018-04-07: 20 mL

## 2018-04-07 MED ORDER — BUPIVACAINE HCL 0.5 % IJ SOLN
INTRAMUSCULAR | Status: DC | PRN
  Administered 2018-04-07: 30 mL

## 2018-04-07 MED ORDER — PHENYLEPHRINE HCL 10 MG/ML IJ SOLN: INTRAVENOUS | Status: DC | PRN

## 2018-04-07 MED ORDER — SODIUM CHLORIDE 0.9 % IR SOLN
Status: DC | PRN
  Administered 2018-04-07: 3000 mL

## 2018-04-07 MED ORDER — RIVAROXABAN 10 MG PO TABS
10.0000 mg | ORAL_TABLET | Freq: Every day | ORAL | Status: DC
  Administered 2018-04-07 – 2018-04-09 (×3): 10 mg via ORAL
  Filled 2018-04-07 (×3): qty 1

## 2018-04-07 MED ORDER — FENTANYL CITRATE (PF) 250 MCG/5ML IJ SOLN
INTRAMUSCULAR | Status: AC
  Filled 2018-04-07: qty 5

## 2018-04-07 MED ORDER — OXYCODONE HCL 5 MG PO TABS
ORAL_TABLET | ORAL | Status: AC
  Filled 2018-04-07: qty 2

## 2018-04-07 MED ORDER — PHENOL 1.4 % MT LIQD: 1.0000 | OROMUCOSAL | Status: DC | PRN

## 2018-04-07 MED ORDER — LIDOCAINE 2% (20 MG/ML) 5 ML SYRINGE
INTRAMUSCULAR | Status: AC
  Filled 2018-04-07: qty 5

## 2018-04-07 MED ORDER — ACETAMINOPHEN 160 MG/5ML PO SOLN: 325.0000 mg | ORAL | Status: DC | PRN

## 2018-04-07 MED ORDER — LIDOCAINE 2% (20 MG/ML) 5 ML SYRINGE
INTRAMUSCULAR | Status: DC | PRN
  Administered 2018-04-07: 30 mg via INTRAVENOUS

## 2018-04-07 MED ORDER — METHOCARBAMOL 500 MG PO TABS
ORAL_TABLET | ORAL | Status: AC
  Filled 2018-04-07: qty 1

## 2018-04-07 MED ORDER — AMLODIPINE BESY-BENAZEPRIL HCL 5-20 MG PO CAPS: 1.0000 | ORAL_CAPSULE | Freq: Every day | ORAL | Status: DC

## 2018-04-07 MED ORDER — CHLORHEXIDINE GLUCONATE 4 % EX LIQD: 60.0000 mL | Freq: Once | CUTANEOUS | Status: DC

## 2018-04-07 MED ORDER — ONDANSETRON HCL 4 MG PO TABS
4.0000 mg | ORAL_TABLET | Freq: Four times a day (QID) | ORAL | Status: DC | PRN
  Administered 2018-04-08: 4 mg via ORAL
  Filled 2018-04-07: qty 1

## 2018-04-07 MED ORDER — AMLODIPINE BESYLATE 5 MG PO TABS
5.0000 mg | ORAL_TABLET | Freq: Every day | ORAL | Status: DC
  Administered 2018-04-08 – 2018-04-10 (×3): 5 mg via ORAL
  Filled 2018-04-07 (×3): qty 1

## 2018-04-07 MED ORDER — FENTANYL CITRATE (PF) 100 MCG/2ML IJ SOLN
25.0000 ug | INTRAMUSCULAR | Status: DC | PRN
  Administered 2018-04-07 (×3): 50 ug via INTRAVENOUS

## 2018-04-07 MED ORDER — MIDAZOLAM HCL 2 MG/2ML IJ SOLN
INTRAMUSCULAR | Status: AC
  Filled 2018-04-07: qty 2

## 2018-04-07 MED ORDER — MIDAZOLAM HCL 5 MG/5ML IJ SOLN
INTRAMUSCULAR | Status: DC | PRN
  Administered 2018-04-07: 2 mg via INTRAVENOUS

## 2018-04-07 MED ORDER — CLONIDINE HCL (ANALGESIA) 100 MCG/ML EP SOLN
EPIDURAL | Status: DC | PRN
  Administered 2018-04-07: 100 ug via INTRA_ARTICULAR

## 2018-04-07 MED ORDER — ONDANSETRON HCL 4 MG/2ML IJ SOLN
INTRAMUSCULAR | Status: DC | PRN
  Administered 2018-04-07: 4 mg via INTRAVENOUS

## 2018-04-07 MED ORDER — BUPIVACAINE HCL (PF) 0.5 % IJ SOLN
INTRAMUSCULAR | Status: AC
  Filled 2018-04-07: qty 30

## 2018-04-07 SURGICAL SUPPLY — 75 items
BAG DECANTER FOR FLEXI CONT (MISCELLANEOUS) ×2 IMPLANT
BANDAGE ELASTIC 6 VELCRO ST LF (GAUZE/BANDAGES/DRESSINGS) ×1 IMPLANT
BANDAGE ESMARK 6X9 LF (GAUZE/BANDAGES/DRESSINGS) ×1 IMPLANT
BLADE SAG 18X100X1.27 (BLADE) ×2 IMPLANT
BLADE SAW SGTL 13.0X1.19X90.0M (BLADE) IMPLANT
BNDG CMPR 9X6 STRL LF SNTH (GAUZE/BANDAGES/DRESSINGS) ×1
BNDG CMPR MED 15X6 ELC VLCR LF (GAUZE/BANDAGES/DRESSINGS) ×1
BNDG COHESIVE 6X5 TAN STRL LF (GAUZE/BANDAGES/DRESSINGS) ×2 IMPLANT
BNDG ELASTIC 6X15 VLCR STRL LF (GAUZE/BANDAGES/DRESSINGS) ×2 IMPLANT
BNDG ESMARK 6X9 LF (GAUZE/BANDAGES/DRESSINGS) ×2
BOWL SMART MIX CTS (DISPOSABLE) IMPLANT
BSPLAT TIB 5 KN TRITANIUM (Knees) ×1 IMPLANT
CONT SPECI 4OZ STER CLIK (MISCELLANEOUS) ×2 IMPLANT
COVER SURGICAL LIGHT HANDLE (MISCELLANEOUS) ×2 IMPLANT
CUFF TOURNIQUET SINGLE 34IN LL (TOURNIQUET CUFF) ×2 IMPLANT
CUFF TOURNIQUET SINGLE 44IN (TOURNIQUET CUFF) IMPLANT
DECANTER SPIKE VIAL GLASS SM (MISCELLANEOUS) ×2 IMPLANT
DRAPE INCISE IOBAN 66X45 STRL (DRAPES) IMPLANT
DRAPE ORTHO SPLIT 77X108 STRL (DRAPES) ×6
DRAPE SURG ORHT 6 SPLT 77X108 (DRAPES) ×3 IMPLANT
DRAPE U-SHAPE 47X51 STRL (DRAPES) ×2 IMPLANT
DRSG AQUACEL AG ADV 3.5X14 (GAUZE/BANDAGES/DRESSINGS) ×1 IMPLANT
DURAPREP 26ML APPLICATOR (WOUND CARE) ×4 IMPLANT
ELECT CAUTERY BLADE 6.4 (BLADE) ×2 IMPLANT
ELECT REM PT RETURN 9FT ADLT (ELECTROSURGICAL) ×2
ELECTRODE REM PT RTRN 9FT ADLT (ELECTROSURGICAL) ×1 IMPLANT
FEMORAL RETAINING CRUC KNEE #4 (Knees) ×1 IMPLANT
GAUZE SPONGE 4X4 12PLY STRL (GAUZE/BANDAGES/DRESSINGS) ×2 IMPLANT
GLOVE BIOGEL PI IND STRL 7.5 (GLOVE) ×1 IMPLANT
GLOVE BIOGEL PI IND STRL 8 (GLOVE) ×1 IMPLANT
GLOVE BIOGEL PI INDICATOR 7.5 (GLOVE) ×1
GLOVE BIOGEL PI INDICATOR 8 (GLOVE) ×1
GLOVE ECLIPSE 7.0 STRL STRAW (GLOVE) ×2 IMPLANT
GLOVE SURG ORTHO 8.0 STRL STRW (GLOVE) ×2 IMPLANT
GOWN STRL REUS W/ TWL LRG LVL3 (GOWN DISPOSABLE) ×3 IMPLANT
GOWN STRL REUS W/TWL LRG LVL3 (GOWN DISPOSABLE) ×6
HANDPIECE INTERPULSE COAX TIP (DISPOSABLE) ×2
HOOD PEEL AWAY FLYTE STAYCOOL (MISCELLANEOUS) ×6 IMPLANT
IMMOBILIZER KNEE 20 (SOFTGOODS) IMPLANT
IMMOBILIZER KNEE 22 UNIV (SOFTGOODS) IMPLANT
IMMOBILIZER KNEE 24 THIGH 36 (MISCELLANEOUS) IMPLANT
IMMOBILIZER KNEE 24 UNIV (MISCELLANEOUS)
INSERT TIB 5 9XCNDRL STAB BRNG (Insert) IMPLANT
KIT BASIN OR (CUSTOM PROCEDURE TRAY) ×2 IMPLANT
KIT TURNOVER KIT B (KITS) ×2 IMPLANT
KNEE INSERT TIB BRG (Insert) ×2 IMPLANT
KNEE PATELLA ASYMMETRIC 10X32 (Knees) ×1 IMPLANT
KNEE TIBIAL COMPONENT SZ5 (Knees) ×1 IMPLANT
MANIFOLD NEPTUNE II (INSTRUMENTS) ×2 IMPLANT
NDL SAFETY ECLIPSE 18X1.5 (NEEDLE) ×1 IMPLANT
NEEDLE 22X1 1/2 (OR ONLY) (NEEDLE) ×4 IMPLANT
NEEDLE HYPO 18GX1.5 SHARP (NEEDLE) ×2
NS IRRIG 1000ML POUR BTL (IV SOLUTION) ×4 IMPLANT
PACK TOTAL JOINT (CUSTOM PROCEDURE TRAY) ×2 IMPLANT
PAD ARMBOARD 7.5X6 YLW CONV (MISCELLANEOUS) ×4 IMPLANT
PAD CAST 4YDX4 CTTN HI CHSV (CAST SUPPLIES) ×1 IMPLANT
PADDING CAST COTTON 4X4 STRL (CAST SUPPLIES)
PADDING CAST COTTON 6X4 STRL (CAST SUPPLIES) ×3 IMPLANT
SET HNDPC FAN SPRY TIP SCT (DISPOSABLE) ×1 IMPLANT
STRIP CLOSURE SKIN 1/2X4 (GAUZE/BANDAGES/DRESSINGS) ×4 IMPLANT
SUCTION FRAZIER HANDLE 10FR (MISCELLANEOUS) ×1
SUCTION TUBE FRAZIER 10FR DISP (MISCELLANEOUS) ×1 IMPLANT
SUT MNCRL AB 3-0 PS2 18 (SUTURE) ×2 IMPLANT
SUT VIC AB 0 CT1 27 (SUTURE) ×6
SUT VIC AB 0 CT1 27XBRD ANBCTR (SUTURE) ×3 IMPLANT
SUT VIC AB 1 CT1 27 (SUTURE) ×10
SUT VIC AB 1 CT1 27XBRD ANBCTR (SUTURE) ×5 IMPLANT
SUT VIC AB 2-0 CT1 27 (SUTURE) ×8
SUT VIC AB 2-0 CT1 TAPERPNT 27 (SUTURE) ×4 IMPLANT
SYR 30ML LL (SYRINGE) ×6 IMPLANT
SYR TB 1ML LUER SLIP (SYRINGE) ×2 IMPLANT
TOWEL OR 17X24 6PK STRL BLUE (TOWEL DISPOSABLE) ×4 IMPLANT
TOWEL OR 17X26 10 PK STRL BLUE (TOWEL DISPOSABLE) ×4 IMPLANT
TRAY CATH 16FR W/PLASTIC CATH (SET/KITS/TRAYS/PACK) IMPLANT
WATER STERILE IRR 1000ML POUR (IV SOLUTION) IMPLANT

## 2018-04-07 NOTE — Transfer of Care (Signed)
Immediate Anesthesia Transfer of Care Note  Patient: Maureen Duncan  Procedure(s) Performed: LEFT TOTAL KNEE ARTHROPLASTY (Left Leg Lower)  Patient Location: PACU  Anesthesia Type:Spinal and MAC combined with regional for post-op pain  Level of Consciousness: drowsy and patient cooperative  Airway & Oxygen Therapy: Patient Spontanous Breathing and Patient connected to face mask oxygen  Post-op Assessment: Report given to RN and Post -op Vital signs reviewed and stable  Post vital signs: Reviewed and stable  Last Vitals:  Vitals Value Taken Time  BP 90/68 04/07/2018 10:38 AM  Temp 36.3 C 04/07/2018 10:38 AM  Pulse 80 04/07/2018 10:39 AM  Resp 11 04/07/2018 10:39 AM  SpO2 99 % 04/07/2018 10:39 AM  Vitals shown include unvalidated device data.  Last Pain:  Vitals:   04/07/18 1038  TempSrc:   PainSc: (P) 0-No pain         Complications: No apparent anesthesia complications

## 2018-04-07 NOTE — Brief Op Note (Signed)
04/07/2018  10:16 AM  PATIENT:  [name]  67 y.o. female  PRE-OPERATIVE DIAGNOSIS:  left knee osteoarthritis  POST-OPERATIVE DIAGNOSIS:  left knee osteoarthritis  PROCEDURE:  Procedure(s): LEFT TOTAL KNEE ARTHROPLASTY  SURGEON:  Surgeon(s): Marlou Sa, Tonna Corner, MD  ASSISTANT: Laure Kidney rnfa  ANESTHESIA:   spinal  EBL: 75 ml    Total I/O In: 1000 [I.V.:1000] Out: -   BLOOD ADMINISTERED: none  DRAINS: none   LOCAL MEDICATIONS USED:  Marcaine mso4 clonidine exparel  SPECIMEN:  No Specimen  COUNTS:  YES  TOURNIQUET:   Total Tourniquet Time Documented: Thigh (Right) - 72 minutes Total: Thigh (Right) - 72 minutes   DICTATION: .Other Dictation: Dictation Number [id number]  PLAN OF CARE: Admit to inpatient   PATIENT DISPOSITION:  PACU - hemodynamically stable

## 2018-04-07 NOTE — Anesthesia Procedure Notes (Signed)
Anesthesia Regional Block: Adductor canal block   Pre-Anesthetic Checklist: ,, timeout performed, Correct Patient, Correct Site, Correct Laterality, Correct Procedure, Correct Position, site marked, Risks and benefits discussed,  Surgical consent,  Pre-op evaluation,  At surgeon's request and post-op pain management  Laterality: Lower and Left  Prep: chloraprep       Needles:  Injection technique: Single-shot  Needle Type: Echogenic Stimulator Needle          Additional Needles:   Procedures:,,,, ultrasound used (permanent image in chart),,,,  Narrative:  Start time: 04/07/2018 7:24 AM End time: 04/07/2018 7:28 AM Injection made incrementally with aspirations every 5 mL.  Performed by: Personally  Anesthesiologist: Oleta Mouse, MD  Additional Notes: H+P and labs reviewed, risks and benefits discussed with patient, procedure tolerated well without complications

## 2018-04-07 NOTE — Care Management Note (Signed)
Case Management Note  Patient Details  Name: Maureen Duncan MRN: [id number] Date of Birth: 1950/12/30  Subjective/Objective:   LT TKR on 04/07/18                 Action/Plan: Plan HHPT with [facility] upon dc, as previously arranged.   Expected Discharge Date:                  Expected Discharge Plan:  [place]  In-House Referral:     Discharge planning Services  CM Consult  Post Acute Care Choice:  Home Health Choice offered to:     DME Arranged:    DME Agency:     HH Arranged:  PT [place]:  [facility] (now [facility])  Status of Service:  In process, will continue to follow  If discussed at Long Length of Stay Meetings, dates discussed:    Additional Comments:  Ella Bodo, RN 04/07/2018, 5:19 PM

## 2018-04-07 NOTE — Evaluation (Signed)
Physical Therapy Evaluation Patient Details Name: Maureen Duncan MRN: [id number] DOB: August 24, 1951 Today's Date: 04/07/2018   History of Present Illness  Pt is a 67 y.o. female s/p elective L TKA on 04/07/18. PMH includes HTN, breast CA, arthritis, DVT.  Clinical Impression  Pt presents with an overall decrease in functional mobility secondary to above. PTA, pt indep and lives with grandson; plans to d/c to daughter's home who will be available for 24/7 support. Educ on precautions, positioning, therex, and importance of mobility. Today, pt limited by significant pain and nausea; able to stand with RW and min guard for balance, but unable to WB through LLE due to pain. Expect pt to progress well with mobility once pain has decreased. Pt would benefit from continued acute PT services to maximize functional mobility and independence prior to d/c with HHPT services.     Follow Up Recommendations Follow surgeon's recommendation for DC plan and follow-up therapies;Supervision for mobility/OOB    Equipment Recommendations  None recommended by PT    Recommendations for Other Services       Precautions / Restrictions Precautions Precautions: Fall;Knee Precaution Booklet Issued: Yes (comment) Precaution Comments: Verbally reviewed precautions Restrictions Weight Bearing Restrictions: Yes LLE Weight Bearing: Weight bearing as tolerated      Mobility  Bed Mobility Overal bed mobility: Needs Assistance Bed Mobility: Supine to Sit;Sit to Supine     Supine to sit: Mod assist Sit to supine: Mod assist   General bed mobility comments: ModA to assist LLE supine<>sit; significant increased time and effort due to pain and nausea  Transfers Overall transfer level: Needs assistance Equipment used: Rolling walker (2 wheeled) Transfers: Sit to/from Stand Sit to Stand: Min guard         General transfer comment: Stood with RW and min guard for balance, pt with almost no WB through LLE due to  pain; significant increased time and effort  Ambulation/Gait             General Gait Details: Unable to take steps due to pain and nausea; standing with all weight through BUEs and RLE  Stairs            Wheelchair Mobility    Modified Rankin (Stroke Patients Only)       Balance Overall balance assessment: Needs assistance   Sitting balance-Leahy Scale: Fair       Standing balance-Leahy Scale: Poor Standing balance comment: Heavy reliance on BUE support                             Pertinent Vitals/Pain Pain Assessment: Faces Faces Pain Scale: Hurts worst Pain Location: L knee Pain Descriptors / Indicators: Crying;Operative site guarding;Grimacing Pain Intervention(s): Monitored during session;Limited activity within patient's tolerance;Premedicated before session    [place] expects to be discharged to:: Private residence Living Arrangements: Alone Available Help at Discharge: Family;Available 24 hours/day Type of Home: House Home Access: Level entry     Home Layout: One level Home Equipment: Walker - 2 wheels;Bedside commode Additional Comments: Plans to discharge to daughter's home for at least one week     Prior Function Level of Independence: Independent               Hand Dominance        Extremity/Trunk Assessment   Upper Extremity Assessment Upper Extremity Assessment: Overall WFL for tasks assessed    Lower Extremity Assessment Lower Extremity Assessment: LLE deficits/detail LLE Deficits /  Details: s/p L TKA; hip flex <3/5       Communication   Communication: No difficulties  Cognition Arousal/Alertness: Awake/alert Behavior During Therapy: WFL for tasks assessed/performed Overall Cognitive Status: Within Functional Limits for tasks assessed                                        General Comments General comments (skin integrity, edema, etc.): Daughter present throughout  session    Exercises General Exercises - Lower Extremity Heel Slides: AAROM;Left;5 reps   Assessment/Plan    PT Assessment Patient needs continued PT services  PT Problem List Decreased strength;Decreased range of motion;Decreased activity tolerance;Decreased balance;Decreased mobility;Decreased knowledge of use of DME;Decreased knowledge of precautions;Pain       PT Treatment Interventions DME instruction;Gait training;Functional mobility training;Therapeutic activities;Therapeutic exercise;Balance training;Stair training;Patient/family education    PT Goals (Current goals can be found in the Care Plan section)  Acute Rehab PT Goals Patient Stated Goal: Less pain PT Goal Formulation: With patient Time For Goal Achievement: 04/21/18 Potential to Achieve Goals: Good    Frequency 7X/week   Barriers to discharge        Co-evaluation               AM-PAC PT "6 Clicks" Daily Activity  Outcome Measure Difficulty turning over in bed (including adjusting bedclothes, sheets and blankets)?: A Little Difficulty moving from lying on back to sitting on the side of the bed? : Unable Difficulty sitting down on and standing up from a chair with arms (e.g., wheelchair, bedside commode, etc,.)?: A Little Help needed moving to and from a bed to chair (including a wheelchair)?: A Little Help needed walking in hospital room?: A Lot Help needed climbing 3-5 steps with a railing? : A Lot 6 Click Score: 14    End of Session Equipment Utilized During Treatment: Gait belt Activity Tolerance: Patient limited by pain Patient left: in bed;with call bell/phone within reach;with family/visitor present;with nursing/sitter in room Nurse Communication: Mobility status PT Visit Diagnosis: Other abnormalities of gait and mobility (R26.89)    Time: 2706-2376 PT Time Calculation (min) (ACUTE ONLY): 33 min   Charges:   PT Evaluation $PT Eval Moderate Complexity: 1 Mod PT Treatments $Therapeutic  Activity: 8-22 mins   PT G Codes:       Mabeline Caras, PT, DPT Acute Rehab Services  Pager: [place] 04/07/2018, 5:44 PM

## 2018-04-07 NOTE — Anesthesia Preprocedure Evaluation (Signed)
Anesthesia Evaluation  Patient identified by MRN, date of birth, ID band Patient awake    Reviewed: Allergy & Precautions, H&P , NPO status , Patient's Chart, lab work & pertinent test results  History of Anesthesia Complications Negative for: history of anesthetic complications  Airway Mallampati: II  TM Distance: >3 FB Neck ROM: Full    Dental  (+) Dental Advisory Given, Partial Upper   Pulmonary former smoker,    breath sounds clear to auscultation       Cardiovascular hypertension, Pt. on medications + dysrhythmias  Rhythm:Regular Rate:Normal   EKG 02/16/18: NSR.  Ventricular pre-excitation, WPW pattern type B  Echo 01/17/16  1.  LV cavity normal in size.  Abnormal septal wall motion due to LBBB.  Doppler evidence of grade high diastolic dysfunction.  Borderline normal systolic function.  Calculated EF 51%. 2.  Trace mitral regurgitation. 3.  Mild tricuspid regurgitation.  No evidence of pulmonary hypertension  Nuclear stress test 12/29/2015 1.  Resting EKG demonstrated NSR, LBBB, and no resting arrhythmias.  Stress EKG nondiagnostic for ischemia as a pharmacologic stress using Lexiscan.  Stress symptoms included dyspnea. 2.  Stress and rest SPECT images demonstrate homogeneous tracer distribution throughout the myocardium.  SPECT imaging reveals normal myocardial thickening and wall motion.  LVEF was normal visually, but calculated at 45%.  This is a low risk study.      Neuro/Psych negative neurological ROS  negative psych ROS   GI/Hepatic Neg liver ROS, GERD  Medicated and Controlled,  Endo/Other  Obesity   Renal/GU negative Renal ROS  negative genitourinary   Musculoskeletal  (+) Arthritis , Osteoarthritis,    Abdominal   Peds negative pediatric ROS (+)  Hematology negative hematology ROS (+) Plt 276k   Anesthesia Other Findings  - Right arm precautions given history of breast cancer and axillary  lymph node removals, s/p chemo, history of DVT related to that time period   Reproductive/Obstetrics negative OB ROS                             Anesthesia Physical Anesthesia Plan  ASA: II  Anesthesia Plan: MAC, Regional and Spinal   Post-op Pain Management:    Induction:   PONV Risk Score and Plan: 2 and Ondansetron  Airway Management Planned: Nasal Cannula  Additional Equipment: None  Intra-op Plan:   Post-operative Plan:   Informed Consent: I have reviewed the patients History and Physical, chart, labs and discussed the procedure including the risks, benefits and alternatives for the proposed anesthesia with the patient or authorized representative who has indicated his/her understanding and acceptance.   Dental advisory given  Plan Discussed with: CRNA and Surgeon  Anesthesia Plan Comments:         Anesthesia Quick Evaluation

## 2018-04-07 NOTE — Op Note (Signed)
NAME: Maureen Duncan MEDICAL RECORD [id number] ACCOUNT [id number] DATE OF BIRTH:05-Aug-1951 FACILITY: [facility] LOCATION: [facility] PHYSICIAN:GREGORY Randel Pigg, MD  OPERATIVE REPORT  DATE OF PROCEDURE:  04/07/2018  PREOPERATIVE DIAGNOSIS:  Left knee arthritis.  POSTOPERATIVE DIAGNOSIS:  Left knee arthritis.  PROCEDURE:  Left total knee replacement.  SURGEON:  Marcene Duos, MD  ASSISTANT:  Laure Kidney, RNFA   INDICATIONS:  Maureen Duncan is a 67 year old patient with left knee arthritis.  She presents now for operative management after explanation of risks and benefits.  COMPONENTS UTILIZED:  Stryker press-fit Triathlon knee 4 femur, 5 tibia, 9 mm cruciate-retaining polyethylene insert, 32 mm press fit, 3-peg patella.  INDICATIONS:  Maureen Duncan is a 67 year old patient with end-stage left knee arthritis which has failed conservative management.  She presents now for operative management after explanation of risks and benefits.  PROCEDURE IN DETAIL:  The patient was brought to the operating room where spinal anesthetic was induced.  Preoperative antibiotics were administered.  Timeout was called.  Left leg was pre-scrubbed with alcohol and Betadine, allowed to air dry, prepped  with DuraPrep solution and draped in a sterile manner.  [name] was used to cover the entire operative field.  The leg was elevated and exsanguinated with the Esmarch wrap.  Tourniquet was inflated for a total tourniquet time of 72 minutes.  This was at  300 mmHg.  Anterior approach to the knee was made.  Skin and subcutaneous tissue were sharply divided.  Median parapatellar approach was made.  The knee precise location was marked with an 0 Vicryl.  Fat pad was partially excised.  Lateral patellofemoral  ligament was released.  The ACL was released.  Osteophytes were removed.  Soft tissue was removed from the anterior distal portion of the femur.  With the patella everted, intramedullary alignment was utilized to make a cut  on the tibia 9 mm off the  least affected lateral tibial plateau which was perpendicular to the mechanical axis.  Collaterals and posterior structures were protected.  A cut was made with protection and maintenance of the PCL.  Attention was then directed towards the femoral side.   An 8 mm cut was made.  A 9 mm spacer block achieved full extension with good stability to varus and valgus stress at 0 degrees.  At this time, the femur was sized to a size 4, and the anterior, posterior and chamfer cuts were made.  The tibia was then  keel punched to a size 5.  Correct rotational alignment was ensured.  A trial reduction was then performed with a 9 mm spacer, and that gave full extension, excellent flexion, good stability to varus and valgus stress at 0, 30 and 90 degrees.  Patella  was cut from 22 down to 12, and a trial button was placed with excellent patellar tracking using no-thumbs technique.  Trial components were removed.  Thorough irrigation performed with 3 L of irrigating solution.  Exparel injected into the capsule.   Tranexamic acid catheter placed into the incision and allowed to stay for 3 minutes.  This was removed, and then the components were press fit into position.  Bone quality was very good.  Following press fitting of the components, the true 9 polyethylene  spacer was placed, and the patient achieved full extension, full flexion with great stability to varus and valgus stress at 0, 30 and 90 degrees and excellent patellar tracking using no-thumbs technique.  Tourniquet was released.  Bleeding points  encountered were controlled using electrocautery.  Skin edges were anesthetized using plain Marcaine, morphine and clonidine.  At this time, the arthrotomy was closed using #1 Vicryl suture followed by interrupted inverted 0 Vicryl suture, 2-0 Vicryl  suture, and a 3-0 Monocryl.  The patient tolerated the procedure well without any complications and was taken to the recovery room in stable  condition.  Aquacel dressing along with Ace wrap and knee immobilizer.  LN/NUANCE  D:04/07/2018 T:04/07/2018 JOB:[id number]

## 2018-04-07 NOTE — Anesthesia Procedure Notes (Signed)
Spinal  Patient location during procedure: OR Start time: 04/07/2018 7:41 AM End time: 04/07/2018 7:45 AM Staffing Anesthesiologist: Oleta Mouse, MD Preanesthetic Checklist Completed: patient identified, surgical consent, pre-op evaluation, timeout performed, IV checked, risks and benefits discussed and monitors and equipment checked Spinal Block Patient position: sitting Prep: ChloraPrep and site prepped and draped Patient monitoring: heart rate, cardiac monitor, continuous pulse ox and blood pressure Approach: midline Location: L4-5 Injection technique: single-shot Needle Needle type: Pencan  Needle gauge: 24 G Needle length: 10 cm Assessment Sensory level: T6

## 2018-04-07 NOTE — Progress Notes (Addendum)
Orthopedic Tech Progress Note Patient Details:  Maureen Duncan 02-Jun-1951 [id number] Applied overhead frame. CPM Left Knee CPM Left Knee: On Left Knee Flexion (Degrees): 40 Left Knee Extension (Degrees): 0  Post Interventions Patient Tolerated: Well Instructions Provided: Care of device Ortho Devices Type of Ortho Device: Bone foam zero knee Ortho Device/Splint Location: RLE Ortho Device/Splint Interventions: Ordered, Application, Adjustment   Post Interventions Patient Tolerated: Well Instructions Provided: Care of device   Braulio Bosch 04/07/2018, 2:39 PM

## 2018-04-07 NOTE — Progress Notes (Signed)
Orthopedic Tech Progress Note Patient Details:  Maureen Duncan December 09, 1950 [id number]  CPM Left Knee CPM Left Knee: On Left Knee Flexion (Degrees): 40 Left Knee Extension (Degrees): 0  Post Interventions Patient Tolerated: Well Instructions Provided: Care of device Ortho Devices Type of Ortho Device: Bone foam zero knee Ortho Device/Splint Location: LLE Ortho Device/Splint Interventions: Ordered, Application, Adjustment   Post Interventions Patient Tolerated: Well Instructions Provided: Care of device   Braulio Bosch 04/07/2018, 2:40 PM

## 2018-04-07 NOTE — H&P (Signed)
TOTAL KNEE ADMISSION H&P  Patient is being admitted for left total knee arthroplasty.  Subjective:  Chief Complaint:left knee pain.  HPI: Maureen Duncan, 67 y.o. female, has a history of pain and functional disability in the left knee due to arthritis and has failed non-surgical conservative treatments for greater than 12 weeks to includeNSAID's and/or analgesics, corticosteriod injections, flexibility and strengthening excercises and activity modification.  Onset of symptoms was abrupt, starting 2 years ago with rapidlly worsening course since that time. The patient noted no past surgery on the left knee(s).  Patient currently rates pain in the left knee(s) at 8 out of 10 with activity. Patient has night pain, worsening of pain with activity and weight bearing, pain that interferes with activities of daily living, pain with passive range of motion, crepitus and joint swelling.  Patient has evidence of periarticular osteophytes and joint space narrowing by imaging studies. This patient has had 1 previous upper extremity blood clot associated with Port-A-Cath placement.  For that reason we will use topical but not intravenous trans-Amick acid. There is no active infection.  Patient Active Problem List   Diagnosis Date Noted  . Prolapse of female bladder, acquired 05/08/2015  . Right elbow pain 07/25/2014  . Hyperlipidemia 04/28/2014  . Allergic rhinitis 04/28/2014  . Breast cancer, right. T2, N1. Mastectomy 01/30/2012. 01/09/2012   Past Medical History:  Diagnosis Date  . Arthritis   . Breast cancer [facility]) right breast dx 12/ 2000;  recurrence right breast 01/30/12   oncologist-  dr Marin Olp--  Stage IIb, grade 3, (T2n1M0) ductal carcinoma right breast-ER negative/ HER-2 positive---  s/p  right modified mastectomy with node dissection (3 positive) and chemotherapy (ended aug 2013) and Herceptin ended Aug 2014  . Cystocele   . GERD (gastroesophageal reflux disease)    occasionally, takes Zantac OTC  when needed  . History of DVT (deep vein thrombosis)    left subclavian vein -- port of PAC--  removed 06-23-2012  . Hypertension   . LBBB (left bundle branch block)   . Lymphedema of upper extremity    right arm  . Postmenopausal osteoporosis   . Uterine prolapse   . Wears partial dentures    upper    Past Surgical History:  Procedure Laterality Date  . BREAST LUMPECTOMY Right Jan 2001  . COLONOSCOPY  last one -- June 2016   WNL  . CYSTOCELE REPAIR N/A 05/08/2015   Procedure: ANTERIOR REPAIR (CYSTOCELE);  Surgeon: Molli Posey, MD;  Location: [facility];  Service: Gynecology;  Laterality: N/A;  . LAPAROSCOPIC ASSISTED VAGINAL HYSTERECTOMY N/A 05/08/2015   Procedure: LAPAROSCOPIC ASSISTED VAGINAL HYSTERECTOMY;  Surgeon: Molli Posey, MD;  Location: [place];  Service: Gynecology;  Laterality: N/A;  . LAPAROSCOPIC BILATERAL SALPINGO OOPHERECTOMY Bilateral 05/08/2015   Procedure: LAPAROSCOPIC BILATERAL SALPINGO OOPHORECTOMY;  Surgeon: Molli Posey, MD;  Location: [place];  Service: Gynecology;  Laterality: Bilateral;  . MODIFIED RADICAL MASTECTOMY W/ AXILLARY LYMPH NODE DISSECTION Right 01/30/12  . PORT-A-CATH REMOVAL  06/23/2012   Procedure: MINOR REMOVAL PORT-A-CATH;  Surgeon: Shann Medal, MD;  Location: [place];  Service: General;  Laterality: Left;  . PORTACATH PLACEMENT  01/30/2012   Procedure: INSERTION PORT-A-CATH;  Surgeon: Shann Medal, MD;  Location: [place];  Service: General;  Laterality: Left;  . TRANSTHORACIC ECHOCARDIOGRAM  08-24-2012   grade I diastolic dysfunction,  ef 45-50%/  trivial MR  . TUBAL LIGATION  1980    Current Facility-Administered  Medications  Medication Dose Route Frequency Provider Last Rate Last Dose  . 0.9 % irrigation (POUR BTL)    PRN Meredith Pel, MD   1,000 mL at 04/07/18 0735  . bupivacaine liposome (EXPAREL) 1.3 % injection 266 mg  20 mL Other To OR Meredith Pel, MD      . ceFAZolin (ANCEF) IVPB 2g/100 mL premix  2 g Intravenous To SS-Surg Meredith Pel, MD      . chlorhexidine (HIBICLENS) 4 % liquid 4 application  60 mL Topical Once Meredith Pel, MD      . chlorhexidine (HIBICLENS) 4 % liquid 4 application  60 mL Topical Once Meredith Pel, MD      . cloNIDine (DURACLON) 100 mcg/mL inj- OR Only  150 mcg Intra-articular Once Meredith Pel, MD      . sodium chloride irrigation 0.9 %    PRN Meredith Pel, MD   3,000 mL at 04/07/18 0735   Facility-Administered Medications Ordered in Other Encounters  Medication Dose Route Frequency Provider Last Rate Last Dose  . fentaNYL (SUBLIMAZE) injection    Anesthesia Intra-op Jenne Campus, CRNA   50 mcg at 02/24/18 8185  . lactated ringers infusion    Continuous PRN Jenne Campus, CRNA      . midazolam (VERSED) 5 MG/5ML injection    Anesthesia Intra-op Luciana Axe K, CRNA   2 mg at 02/24/18 0703  . ropivacaine (PF) 7.5 mg/mL (0.75%) (NAROPIN) injection    Anesthesia Intra-op Oleta Mouse, MD   15 mL at 04/07/18 0726  . sodium chloride 0.9 % injection 10 mL  10 mL Intravenous PRN Volanda Napoleon, MD   10 mL at 06/17/12 1651   Allergies  Allergen Reactions  . Adhesive [Tape] Rash    Social History   Tobacco Use  . Smoking status: Former Smoker    Packs/day: 0.25    Years: 2.00    Pack years: 0.50    Types: Cigarettes    Start date: 03/31/2010    Last attempt to quit: 11/11/2011    Years since quitting: 6.4  . Smokeless tobacco: Never Used  . Tobacco comment: social smoker  Substance Use Topics  . Alcohol use: Yes    Alcohol/week: 0.0 oz    Comment: occasional    Family History  Problem Relation Age of Onset  . Diabetes Mother   . Hypertension Mother   . Kidney failure Mother   . Kidney disease Mother   . Cancer Mother        pancreatic  . Other Brother        heart transplant  . Cancer Maternal Uncle        lung     Review of Systems   Musculoskeletal: Positive for joint pain.  All other systems reviewed and are negative.   Objective:  Physical Exam  Constitutional: She appears well-developed.  HENT:  Head: Normocephalic.  Eyes: Pupils are equal, round, and reactive to light.  Neck: Normal range of motion.  Cardiovascular: Normal rate.  Respiratory: Effort normal.  Neurological: She is alert.  Skin: Skin is warm.  Psychiatric: She has a normal mood and affect.  Examination of the left knee demonstrates palpable pedal pulses.  Overall alignment is intact.  Less than 5 degree flexion contracture with flexion itself to about 115.  Collateral and cruciate ligaments are stable.  Periretinacular tenderness is present along with medial and lateral joint line tenderness.  Skin in  the left knee region is intact.  Vital signs in last 24 hours: Temp:  [98 F (36.7 C)] 98 F (36.7 C) (06/11 0617) Pulse Rate:  [84] 84 (06/11 0617) Resp:  [18] 18 (06/11 0617) BP: (113)/(76) 113/76 (06/11 0617) SpO2:  [100 %] 100 % (06/11 0617)  Labs:   Estimated body mass index is 30.24 kg/m as calculated from the following:   Height as of 03/27/18: 5' 5"  (1.651 m).   Weight as of 03/27/18: 181 lb 11.2 oz (82.4 kg).   Imaging Review Plain radiographs demonstrate moderate degenerative joint disease of the left knee(s). The overall alignment isneutral. The bone quality appears to be good for age and reported activity level.   Preoperative templating of the joint replacement has been completed, documented, and submitted to the Operating Room personnel in order to optimize intra-operative equipment management.   Anticipated LOS equal to or greater than 2 midnights due to - Age 54 and older with one or more of the following:  - Obesity  - Expected need for hospital services (PT, OT, Nursing) required for safe  discharge  - Anticipated need for postoperative skilled nursing care or inpatient rehab  - Active co-morbidities: DVT/VTE OR    - Unanticipated findings during/Post Surgery: None  - Patient is a high risk of re-admission due to: None     Assessment/Plan:  End stage arthritis, left knee   The patient history, physical examination, clinical judgment of the provider and imaging studies are consistent with end stage degenerative joint disease of the left knee(s) and total knee arthroplasty is deemed medically necessary. The treatment options including medical management, injection therapy arthroscopy and arthroplasty were discussed at length. The risks and benefits of total knee arthroplasty were presented and reviewed. The risks due to aseptic loosening, infection, stiffness, patella tracking problems, thromboembolic complications and other imponderables were discussed. The patient acknowledged the explanation, agreed to proceed with the plan and consent was signed. Patient is being admitted for inpatient treatment for surgery, pain control, PT, OT, prophylactic antibiotics, VTE prophylaxis, progressive ambulation and ADL's and discharge planning. The patient is planning to be discharged home with home health services

## 2018-04-08 ENCOUNTER — Encounter (HOSPITAL_COMMUNITY): Payer: Self-pay | Admitting: Orthopedic Surgery

## 2018-04-08 NOTE — Progress Notes (Signed)
Physical Therapy Treatment Patient Details Name: Maureen Duncan MRN: [id number] DOB: 06-15-1951 Today's Date: 04/08/2018    History of Present Illness Pt is a 67 y.o. female s/p elective L TKA on 04/07/18. PMH includes HTN, breast CA, arthritis, DVT.    PT Comments    Patient cont to progress slowly with therapy this visit, limited by pain and nausea. Patient now min guard for transfers and gait for safety, limited to ambulation in hospital room before becoming nauseated and diaphoretic, BP 117/70, SpO2 98%, HR 130. Discussed lack of progress with patient and goals for tomorrows visit with patient, pt agreeable.     Follow Up Recommendations  Follow surgeon's recommendation for DC plan and follow-up therapies;Supervision for mobility/OOB     Equipment Recommendations  None recommended by PT    Recommendations for Other Services       Precautions / Restrictions Precautions Precautions: Fall;Knee Precaution Booklet Issued: Yes (comment) Precaution Comments: Verbally reviewed precautions Restrictions Weight Bearing Restrictions: Yes LLE Weight Bearing: Weight bearing as tolerated    Mobility  Bed Mobility Overal bed mobility: Needs Assistance Bed Mobility: Supine to Sit;Sit to Supine     Supine to sit: Min assist Sit to supine: Mod assist   General bed mobility comments: Mod A to assist leg back into bed, patient able to use non operative leg to support surgical leg out of bed. increased time and effort due to high pain levels  Transfers Overall transfer level: Needs assistance Equipment used: Rolling walker (2 wheeled) Transfers: Sit to/from Stand Sit to Stand: Min guard         General transfer comment: Patient with increased time and effort, ulimately able to power up into RW without physical assistance, guard for safety. cues for hand placement and cues for WB  Ambulation/Gait Ambulation/Gait assistance: Min guard Ambulation Distance (Feet): 10 Feet Assistive  device: Rolling walker (2 wheeled) Gait Pattern/deviations: Step-to pattern;Trunk flexed;Antalgic;Shuffle Gait velocity: decreased   General Gait Details: cues for sequencing and use of RW to increase safety. Pt ambulating short distances in hospital room to couch and back, became nauseous and diaphoretic, returned to seated position. BP 117/70, HR 130, SpO2 98% on RA. returned to supine. patient held off emesis    Stairs             Wheelchair Mobility    Modified Rankin (Stroke Patients Only)       Balance Overall balance assessment: Needs assistance   Sitting balance-Leahy Scale: Fair       Standing balance-Leahy Scale: Poor Standing balance comment: Heavy reliance on BUE support                            Cognition Arousal/Alertness: Awake/alert Behavior During Therapy: WFL for tasks assessed/performed Overall Cognitive Status: Within Functional Limits for tasks assessed                                        Exercises      General Comments        Pertinent Vitals/Pain Pain Assessment: Faces Faces Pain Scale: Hurts whole lot Pain Location: L knee Pain Descriptors / Indicators: Crying;Operative site guarding;Grimacing Pain Intervention(s): Limited activity within patient's tolerance;Monitored during session;Premedicated before session;Repositioned    Home Living  Prior Function            PT Goals (current goals can now be found in the care plan section) Acute Rehab PT Goals Patient Stated Goal: Less pain PT Goal Formulation: With patient Time For Goal Achievement: 04/21/18 Potential to Achieve Goals: Good Progress towards PT goals: Progressing toward goals    Frequency    7X/week      PT Plan Current plan remains appropriate    Co-evaluation              AM-PAC PT "6 Clicks" Daily Activity  Outcome Measure  Difficulty turning over in bed (including adjusting bedclothes,  sheets and blankets)?: Unable Difficulty moving from lying on back to sitting on the side of the bed? : Unable Difficulty sitting down on and standing up from a chair with arms (e.g., wheelchair, bedside commode, etc,.)?: Unable Help needed moving to and from a bed to chair (including a wheelchair)?: A Little Help needed walking in hospital room?: A Little Help needed climbing 3-5 steps with a railing? : A Lot 6 Click Score: 11    End of Session Equipment Utilized During Treatment: Gait belt Activity Tolerance: Patient limited by pain Patient left: with call bell/phone within reach;with family/visitor present;with nursing/sitter in room;in chair Nurse Communication: Mobility status PT Visit Diagnosis: Other abnormalities of gait and mobility (R26.89)     Time: 7262-0355 PT Time Calculation (min) (ACUTE ONLY): 25 min  Charges:  $Gait Training: 8-22 mins $Therapeutic Activity: 8-22 mins                    G Codes:       Reinaldo Berber, PT, DPT Acute Rehab Services Pager: [place] 04/08/2018, 5:17 PM

## 2018-04-08 NOTE — Anesthesia Postprocedure Evaluation (Signed)
Anesthesia Post Note  Patient: MALINI FLEMINGS  Procedure(s) Performed: LEFT TOTAL KNEE ARTHROPLASTY (Left Leg Lower)     Patient location during evaluation: PACU Anesthesia Type: Regional and Spinal Level of consciousness: awake and alert Pain management: pain level controlled Vital Signs Assessment: post-procedure vital signs reviewed and stable Respiratory status: spontaneous breathing, nonlabored ventilation, respiratory function stable and patient connected to nasal cannula oxygen Cardiovascular status: stable and blood pressure returned to baseline Postop Assessment: no apparent nausea or vomiting and spinal receding Anesthetic complications: no    Last Vitals:  Vitals:   04/08/18 0006 04/08/18 0528  BP: (!) 142/78 (!) 141/88  Pulse: 96 90  Resp:    Temp: 37.2 C 36.4 C  SpO2: 99% 90%    Last Pain:  Vitals:   04/08/18 0759  TempSrc:   PainSc: 10-Worst pain ever                 Tomiko Schoon

## 2018-04-08 NOTE — Progress Notes (Signed)
Physical Therapy Treatment Patient Details Name: Maureen Duncan MRN: [id number] DOB: 01-03-1951 Today's Date: 04/08/2018    History of Present Illness Pt is a 67 y.o. female s/p elective L TKA on 04/07/18. PMH includes HTN, breast CA, arthritis, DVT.    PT Comments    Pt performed increased activity with max VCs for encouragement to continue PT tx.  Pt on arrival with yellow foam under her L knee.  She was educated this was not beneficial.  Pt remains to lack knee extension and post tx PTA placed folded yellow foam under her L ankle to promote knee extension.  Pt limited to 34ft of gait training due to pain and required close to 10 min to complete 24ft of gait training.  She is progressing slowly and this is concerning if she is to return home.    Follow Up Recommendations  Follow surgeon's recommendation for DC plan and follow-up therapies;Supervision for mobility/OOB     Equipment Recommendations  None recommended by PT    Recommendations for Other Services       Precautions / Restrictions Precautions Precautions: Fall;Knee Precaution Booklet Issued: Yes (comment) Precaution Comments: Verbally reviewed precautions Restrictions Weight Bearing Restrictions: Yes LLE Weight Bearing: Weight bearing as tolerated    Mobility  Bed Mobility Overal bed mobility: Needs Assistance Bed Mobility: Supine to Sit;Sit to Supine     Supine to sit: Mod assist     General bed mobility comments: Mod assistance to advance LLE to edge of bed.  Pt slow and guarded with posterior translation in sitting.    Transfers Overall transfer level: Needs assistance Equipment used: Rolling walker (2 wheeled) Transfers: Sit to/from Stand Sit to Stand: Min guard         General transfer comment: Stood with RW and min guard for balance, pt with almost no WB through LLE due to pain; significant increased time and effort  Ambulation/Gait Ambulation/Gait assistance: Min assist;+2 safety/equipment(for  chair follow) Ambulation Distance (Feet): 8 Feet Assistive device: Rolling walker (2 wheeled) Gait Pattern/deviations: Step-to pattern;Trunk flexed;Antalgic;Shuffle     General Gait Details: Pt requested to make RW shorter despite educating that the device was the correct height for her.  Pt throws weight forward with each step forward.  Cues for sequencing, weight shifting and upper trunk control.  Close chair follow for safety.     Stairs             Wheelchair Mobility    Modified Rankin (Stroke Patients Only)       Balance                                            Cognition Arousal/Alertness: Awake/alert Behavior During Therapy: WFL for tasks assessed/performed Overall Cognitive Status: Within Functional Limits for tasks assessed                                        Exercises Total Joint Exercises Ankle Circles/Pumps: AROM;Both;20 reps;Supine Quad Sets: AROM;Right;10 reps;Supine Towel Squeeze: AROM;Both;10 reps;Supine Short Arc Quad: AAROM;Right;10 reps;Supine Heel Slides: Right;10 reps;Supine;AAROM Hip ABduction/ADduction: AAROM;Right;Supine;10 reps Straight Leg Raises: AAROM;Right;10 reps;Supine    General Comments        Pertinent Vitals/Pain Pain Assessment: Faces Faces Pain Scale: Hurts worst Pain Location: L knee Pain Descriptors / Indicators:  Crying;Operative site guarding;Grimacing Pain Intervention(s): Monitored during session;Repositioned;Ice applied    Home Living                      Prior Function            PT Goals (current goals can now be found in the care plan section) Acute Rehab PT Goals Patient Stated Goal: Less pain Potential to Achieve Goals: Good Progress towards PT goals: Progressing toward goals    Frequency    7X/week      PT Plan Current plan remains appropriate    Co-evaluation              AM-PAC PT "6 Clicks" Daily Activity  Outcome Measure   Difficulty turning over in bed (including adjusting bedclothes, sheets and blankets)?: Unable Difficulty moving from lying on back to sitting on the side of the bed? : Unable Difficulty sitting down on and standing up from a chair with arms (e.g., wheelchair, bedside commode, etc,.)?: Unable Help needed moving to and from a bed to chair (including a wheelchair)?: A Little Help needed walking in hospital room?: A Lot Help needed climbing 3-5 steps with a railing? : A Lot 6 Click Score: 10    End of Session Equipment Utilized During Treatment: Gait belt Activity Tolerance: Patient limited by pain Patient left: with call bell/phone within reach;with family/visitor present;with nursing/sitter in room;in chair Nurse Communication: Mobility status PT Visit Diagnosis: Other abnormalities of gait and mobility (R26.89)     Time: 2800-3491 PT Time Calculation (min) (ACUTE ONLY): 43 min  Charges:  $Gait Training: 8-22 mins $Therapeutic Exercise: 8-22 mins $Therapeutic Activity: 8-22 mins                    G CodesGovernor Rooks, PTA pager [phone]    Cristela Blue 04/08/2018, 12:42 PM

## 2018-04-09 LAB — GLUCOSE, CAPILLARY: GLUCOSE-CAPILLARY: 117 mg/dL — AB (ref 65–99)

## 2018-04-09 MED ORDER — DOCUSATE SODIUM 100 MG PO CAPS: 100.0000 mg | ORAL_CAPSULE | Freq: Two times a day (BID) | ORAL | 0 refills | Status: DC

## 2018-04-09 MED ORDER — RIVAROXABAN 10 MG PO TABS: 10.0000 mg | ORAL_TABLET | Freq: Every day | ORAL | 0 refills | Status: DC

## 2018-04-09 MED ORDER — OXYCODONE HCL 5 MG PO TABS: 5.0000 mg | ORAL_TABLET | ORAL | 0 refills | Status: DC | PRN

## 2018-04-09 MED ORDER — METHOCARBAMOL 500 MG PO TABS: 500.0000 mg | ORAL_TABLET | Freq: Four times a day (QID) | ORAL | 0 refills | Status: DC | PRN

## 2018-04-09 NOTE — Discharge Instructions (Addendum)

## 2018-04-09 NOTE — Progress Notes (Signed)
Subjective: Patient doing reasonably well yesterday.  Ace wrap removed.  Patient tolerating CPM machine.   Objective: Vital signs in last 24 hours: Temp:  [99.1 F (37.3 C)] 99.1 F (37.3 C) (06/13 0423) Pulse Rate:  [89-94] 89 (06/13 0423) BP: (118-130)/(77-81) 130/81 (06/13 0423) SpO2:  [93 %-95 %] 95 % (06/13 0423)  Intake/Output from previous day: 06/12 0701 - 06/13 0700 In: 720 [P.O.:720] Out: -  Intake/Output this shift: No intake/output data recorded.  Exam:  Sensation intact distally Dorsiflexion/Plantar flexion intact  Labs: No results for input(s): HGB in the last 72 hours. No results for input(s): WBC, RBC, HCT, PLT in the last 72 hours. No results for input(s): NA, K, CL, CO2, BUN, CREATININE, GLUCOSE, CALCIUM in the last 72 hours. No results for input(s): LABPT, INR in the last 72 hours.  Assessment/Plan: Plan is to reassess later today.  Possible discharge today or tomorrow depending on mobility and help available at home.   Maureen Duncan 04/09/2018, 8:19 AM

## 2018-04-09 NOTE — Progress Notes (Deleted)
Physical Therapy Treatment Patient Details Name: Maureen Duncan MRN: [id number] DOB: 04/14/1951 Today's Date: 04/09/2018    History of Present Illness Pt is a 67 y.o. female s/p elective L TKA on 04/07/18. PMH includes HTN, breast CA, arthritis, DVT.    PT Comments    Pt on arrival reports she still has complaints of nausea at this time.  Pt refused OOB reporting," I can't right now."  PTA encouraged exercises and patient agreeable.  She presents with decreased ROM in R knee despite efforts to assist in flexion exercises.  Pt presents with 30 degrees of R knee flexion which is rather concerning as this is hindering her functional mobility.   Pt did agree post exercises to ambulate to and from the commode.  Pt required max encouragement to sit in the recliner chair.  PTA educated on the benefits of being OOB and patient did agree to sit up through lunch.  Will f/u for progression of mobility this pm.    Follow Up Recommendations  Follow surgeon's recommendation for DC plan and follow-up therapies;Supervision for mobility/OOB     Equipment Recommendations  None recommended by PT    Recommendations for Other Services       Precautions / Restrictions Precautions Precautions: Fall;Knee Precaution Booklet Issued: Yes (comment) Precaution Comments: Verbally reviewed precautions Restrictions Weight Bearing Restrictions: Yes LLE Weight Bearing: Weight bearing as tolerated    Mobility  Bed Mobility Overal bed mobility: Needs Assistance Bed Mobility: Supine to Sit     Supine to sit: Mod assist     General bed mobility comments: Pt required assistance to guide RLE OOB.  Pt slow and guarded secondary to pain.    Transfers Overall transfer level: Needs assistance Equipment used: Rolling walker (2 wheeled) Transfers: Sit to/from Stand Sit to Stand: Min guard         General transfer comment: Cues for hand placement.  Pt is unable to flex her R knee during sit to stand transfers.     Ambulation/Gait Ambulation/Gait assistance: Min guard Gait Distance (Feet): 10 Feet(x2 to and from bathroom.  ) Assistive device: Rolling walker (2 wheeled) Gait Pattern/deviations: Step-to pattern;Trunk flexed;Antalgic;Shuffle Gait velocity: decreased   General Gait Details: Cues for R heel strike on R foot as she presents with R ankle plantar flexed due to Heel cord tightness.  Pt slow and guarded.     Stairs             Wheelchair Mobility    Modified Rankin (Stroke Patients Only)       Balance     Sitting balance-Leahy Scale: Fair       Standing balance-Leahy Scale: Poor                              Cognition Arousal/Alertness: Awake/alert Behavior During Therapy: WFL for tasks assessed/performed Overall Cognitive Status: Within Functional Limits for tasks assessed                                        Exercises Total Joint Exercises Ankle Circles/Pumps: AROM;Both;10 reps;Supine Quad Sets: AROM;Right;10 reps;Supine Towel Squeeze: AROM;Both;10 reps;Supine Short Arc Quad: Right;10 reps;Supine;AAROM Heel Slides: AAROM;Right;10 reps;Supine(limited flexion) Hip ABduction/ADduction: AAROM;Right;Supine;10 reps Straight Leg Raises: AAROM;Right;10 reps;Supine Goniometric ROM: 30 degrees flexion in R knee.     General Comments  Pertinent Vitals/Pain Pain Assessment: 0-10 Pain Score: 8  Pain Location: L knee Pain Descriptors / Indicators: Crying;Operative site guarding;Grimacing Pain Intervention(s): Monitored during session;Repositioned;RN gave pain meds during session;Patient requesting pain meds-RN notified    Home Living                      Prior Function            PT Goals (current goals can now be found in the care plan section) Acute Rehab PT Goals Patient Stated Goal: Less pain Potential to Achieve Goals: Good Progress towards PT goals: Progressing toward goals    Frequency     7X/week      PT Plan Current plan remains appropriate    Co-evaluation              AM-PAC PT "6 Clicks" Daily Activity  Outcome Measure  Difficulty turning over in bed (including adjusting bedclothes, sheets and blankets)?: Unable Difficulty moving from lying on back to sitting on the side of the bed? : Unable Difficulty sitting down on and standing up from a chair with arms (e.g., wheelchair, bedside commode, etc,.)?: Unable Help needed moving to and from a bed to chair (including a wheelchair)?: A Little Help needed walking in hospital room?: A Little Help needed climbing 3-5 steps with a railing? : A Lot 6 Click Score: 11    End of Session Equipment Utilized During Treatment: Gait belt Activity Tolerance: Patient limited by pain;Patient tolerated treatment well Patient left: with call bell/phone within reach;with family/visitor present;with nursing/sitter in room;in chair Nurse Communication: Mobility status PT Visit Diagnosis: Other abnormalities of gait and mobility (R26.89)     Time: 6945-0388 PT Time Calculation (min) (ACUTE ONLY): 32 min  Charges:  $Therapeutic Exercise: 8-22 mins $Therapeutic Activity: 8-22 mins                    G CodesGovernor Rooks, PTA pager [phone]    Cristela Blue 04/09/2018, 12:14 PM

## 2018-04-09 NOTE — Care Management Note (Signed)
Case Management Note  Patient Details  Name: Maureen Duncan MRN: [id number] Date of Birth: Mar 15, 1951  Subjective/Objective: 67 yr old female s/p left total knee arthroplasty.                    Action/Plan:  Patient was preoperatively setup with [facility], no changes. She has DME. Patient will have family support at discharge.    Expected Discharge Date:  04/10/18               Expected Discharge Plan:  [place]  In-House Referral:  NA  Discharge planning Services  CM Consult  Post Acute Care Choice:  Home Health Choice offered to:     DME Arranged:  3-N-1, Walker rolling DME Agency:  [employer]  HH Arranged:  PT [place]:  [facility] (now [facility])  Status of Service:  Completed, signed off  If discussed at [employer], dates discussed:    Additional Comments:  Ninfa Meeker, RN 04/09/2018, 1:02 PM

## 2018-04-09 NOTE — Progress Notes (Signed)
Subjective: Patient stable.  Pain better controlled today.  She has been ambulating in the room.   Objective: Vital signs in last 24 hours: Temp:  [99.1 F (37.3 C)] 99.1 F (37.3 C) (06/13 0423) Pulse Rate:  [89-94] 92 (06/13 0834) BP: (118-134)/(77-86) 134/86 (06/13 0834) SpO2:  [93 %-95 %] 95 % (06/13 0423)  Intake/Output from previous day: 06/12 0701 - 06/13 0700 In: 720 [P.O.:720] Out: -  Intake/Output this shift: Total I/O In: 240 [P.O.:240] Out: -   Exam:  Intact pulses distally Dorsiflexion/Plantar flexion intact  Labs: No results for input(s): HGB in the last 72 hours. No results for input(s): WBC, RBC, HCT, PLT in the last 72 hours. No results for input(s): NA, K, CL, CO2, BUN, CREATININE, GLUCOSE, CALCIUM in the last 72 hours. No results for input(s): LABPT, INR in the last 72 hours.  Assessment/Plan: Plan is more physical therapy today with discharge to home tomorrow   Anderson Malta 04/09/2018, 12:09 PM

## 2018-04-09 NOTE — Progress Notes (Signed)
Physical Therapy Treatment Patient Details Name: Maureen Duncan MRN: [id number] DOB: 12-15-1950 Today's Date: 04/09/2018    History of Present Illness Pt is a 68 y.o. female s/p elective L TKA on 04/07/18. PMH includes HTN, breast CA, arthritis, DVT.    PT Comments    Pt remains slow and guarded due to fear and anxiety in response to pain.  Pt required increased time to perform gait training, functional mobility and especially stair training.  After several trials and different techniques patient is the most comfortable with use of SPC to negotiate stairs sideways with L rail.  Due to increased time spent on functional mobility we did not review HEP this session.  Educated daughter to guide patient through HEP this pm and ask questions in am about technique.  Plan for tomorrow to retrial stairs and review HEP.      Follow Up Recommendations  Follow surgeon's recommendation for DC plan and follow-up therapies;Supervision for mobility/OOB     Equipment Recommendations  None recommended by PT    Recommendations for Other Services       Precautions / Restrictions Precautions Precautions: Fall;Knee Precaution Booklet Issued: Yes (comment) Precaution Comments: Verbally reviewed precautions Restrictions Weight Bearing Restrictions: Yes LLE Weight Bearing: Weight bearing as tolerated    Mobility  Bed Mobility Overal bed mobility: Needs Assistance Bed Mobility: Supine to Sit     Supine to sit: Supervision     General bed mobility comments: Pt remains to require increased time but much improved from previous session and she did not require use of the gait belt as a leg lift.    Transfers Overall transfer level: Needs assistance Equipment used: Rolling walker (2 wheeled) Transfers: Sit to/from Stand Sit to Stand: Min guard         General transfer comment: Cues for hand placement to and from seated surface.  Cues for forward advancement of LLE before sitting to ease pain.     Ambulation/Gait Ambulation/Gait assistance: Min guard Gait Distance (Feet): 60 Feet Assistive device: Rolling walker (2 wheeled) Gait Pattern/deviations: Step-to pattern;Trunk flexed;Antalgic;Shuffle;Step-through pattern Gait velocity: decreased   General Gait Details: Progression to step through but remains to stop and start with movement of the RW.     Stairs Stairs: Yes Stairs assistance: (assist level varried) Stair Management: No rails;One rail Right;With cane;With walker Number of Stairs: 9(total but different scenarios) General stair comments: 1st method: backwards with RW and min assist, cues for sequencing and RW placement. 2nd method: sideways with L rail, cues for sequencing and spacing with feet on stair.  Pt required +2 mod assist with this method and physical assistance to lift R foot onto stair.  Decreased assistance to descend this technique with mod assist +1.  3rd method: sideways to negotiate with SPC, cues for sequencing and cane placement as well as foot placement on each stair, required min assistance to perform this technique.  The 3rd method was ultimately the easiest and safest method for the patient to perform.  Pt required increased time to complete stair training for greater than 30 minutes.     Wheelchair Mobility    Modified Rankin (Stroke Patients Only)       Balance Overall balance assessment: Needs assistance   Sitting balance-Leahy Scale: Fair       Standing balance-Leahy Scale: Poor                              Cognition  Arousal/Alertness: Awake/alert Behavior During Therapy: WFL for tasks assessed/performed Overall Cognitive Status: Within Functional Limits for tasks assessed                                        Exercises      General Comments        Pertinent Vitals/Pain Pain Assessment: 0-10 Pain Score: 6  Pain Location: L knee Pain Descriptors / Indicators: Crying;Operative site  guarding;Grimacing Pain Intervention(s): Monitored during session;Repositioned;Ice applied    Home Living                      Prior Function            PT Goals (current goals can now be found in the care plan section) Acute Rehab PT Goals Patient Stated Goal: Less pain Potential to Achieve Goals: Good Progress towards PT goals: Progressing toward goals    Frequency    7X/week      PT Plan Current plan remains appropriate    Co-evaluation              AM-PAC PT "6 Clicks" Daily Activity  Outcome Measure  Difficulty turning over in bed (including adjusting bedclothes, sheets and blankets)?: Unable Difficulty moving from lying on back to sitting on the side of the bed? : Unable Difficulty sitting down on and standing up from a chair with arms (e.g., wheelchair, bedside commode, etc,.)?: Unable Help needed moving to and from a bed to chair (including a wheelchair)?: A Little Help needed walking in hospital room?: A Little Help needed climbing 3-5 steps with a railing? : A Little 6 Click Score: 12    End of Session Equipment Utilized During Treatment: Gait belt Activity Tolerance: Patient limited by pain;Patient tolerated treatment well Patient left: with call bell/phone within reach;with family/visitor present;with nursing/sitter in room;in chair Nurse Communication: Mobility status PT Visit Diagnosis: Other abnormalities of gait and mobility (R26.89)     Time: 1224-8250 PT Time Calculation (min) (ACUTE ONLY): 56 min  Charges:  $Gait Training: 38-52 mins $Therapeutic Activity: 8-22 mins                    G Codes:       Governor Rooks, PTA pager [place] 04/09/2018, 4:43 PM

## 2018-04-09 NOTE — Progress Notes (Signed)
Physical Therapy Treatment Patient Details Name: Maureen Duncan MRN: [id number] DOB: [birth year] Today's Date: 04/09/2018    History of Present Illness Pt is a 67 y.o. female s/p elective L TKA on 04/07/18. PMH includes HTN, breast CA, arthritis, DVT.    PT Comments    Pt performed gait training and functional mobility during this 1st session today.  She continues to require an extended amount of time to perform small functional tasks.  Will plan on performing stair training and HEP this pm to prepare for d/c tomorrow.    Follow Up Recommendations  Follow surgeon's recommendation for DC plan and follow-up therapies;Supervision for mobility/OOB     Equipment Recommendations  None recommended by PT    Recommendations for Other Services       Precautions / Restrictions Precautions Precautions: Fall;Knee Precaution Booklet Issued: Yes (comment) Precaution Comments: Verbally reviewed precautions Restrictions Weight Bearing Restrictions: Yes LLE Weight Bearing: Weight bearing as tolerated    Mobility  Bed Mobility Overal bed mobility: Needs Assistance Bed Mobility: Supine to Sit     Supine to sit: Supervision     General bed mobility comments: Pt required increased time and endurance to advance B LEs to edge of bed with use of gait belt as a leg lifter.  HOB elevated and patient required close to ten minutes to advance to edge of bed.    Transfers Overall transfer level: Needs assistance Equipment used: Rolling walker (2 wheeled) Transfers: Sit to/from Stand Sit to Stand: Min guard         General transfer comment: Pt performed with min guard assistance.  She remains flexed at hip and limited due to decreased knee flexion when coming to standing.   Ambulation/Gait Ambulation/Gait assistance: Min guard Gait Distance (Feet): 30 Feet Assistive device: Rolling walker (2 wheeled) Gait Pattern/deviations: Step-to pattern;Trunk flexed;Antalgic;Shuffle Gait velocity:  decreased   General Gait Details: Pt remains slow and guarded with minor improvements in gait speed.  Pt perserates on watching feet despite cues to gaze forward during gait sequencing.     Stairs             Wheelchair Mobility    Modified Rankin (Stroke Patients Only)       Balance     Sitting balance-Leahy Scale: Fair       Standing balance-Leahy Scale: Poor                              Cognition Arousal/Alertness: Awake/alert Behavior During Therapy: WFL for tasks assessed/performed Overall Cognitive Status: Within Functional Limits for tasks assessed                                        Exercises Total Joint Exercises Ankle Circles/Pumps: AROM;Both;10 reps;Supine Quad Sets: AROM;Right;10 reps;Supine Towel Squeeze: AROM;Both;10 reps;Supine Short Arc Quad: Right;10 reps;Supine;AAROM Heel Slides: AAROM;Right;10 reps;Supine(limited flexion) Hip ABduction/ADduction: AAROM;Right;Supine;10 reps Straight Leg Raises: AAROM;Right;10 reps;Supine Goniometric ROM: 30 degrees flexion in R knee.     General Comments        Pertinent Vitals/Pain Pain Assessment: 0-10 Pain Score: 6  Pain Location: L knee Pain Descriptors / Indicators: Crying;Operative site guarding;Grimacing Pain Intervention(s): Monitored during session;Repositioned;Ice applied    Home Living                      Prior Function  PT Goals (current goals can now be found in the care plan section) Acute Rehab PT Goals Patient Stated Goal: Less pain Potential to Achieve Goals: Good Progress towards PT goals: Progressing toward goals    Frequency    7X/week      PT Plan Current plan remains appropriate    Co-evaluation              AM-PAC PT "6 Clicks" Daily Activity  Outcome Measure  Difficulty turning over in bed (including adjusting bedclothes, sheets and blankets)?: Unable Difficulty moving from lying on back to sitting  on the side of the bed? : Unable Difficulty sitting down on and standing up from a chair with arms (e.g., wheelchair, bedside commode, etc,.)?: Unable Help needed moving to and from a bed to chair (including a wheelchair)?: A Little Help needed walking in hospital room?: A Little Help needed climbing 3-5 steps with a railing? : A Little 6 Click Score: 12    End of Session Equipment Utilized During Treatment: Gait belt Activity Tolerance: Patient limited by pain;Patient tolerated treatment well Patient left: with call bell/phone within reach;with family/visitor present;with nursing/sitter in room;in chair Nurse Communication: Mobility status PT Visit Diagnosis: Other abnormalities of gait and mobility (R26.89)     Time: 6286-3817 PT Time Calculation (min) (ACUTE ONLY): 22 min  Charges:  $Gait Training: 8-22 mins                    G Codes:       Governor Rooks, PTA pager [place] 04/09/2018, 1:14 PM

## 2018-04-10 DIAGNOSIS — Z96651 Presence of right artificial knee joint: Secondary | ICD-10-CM | POA: Diagnosis not present

## 2018-04-10 DIAGNOSIS — M1711 Unilateral primary osteoarthritis, right knee: Secondary | ICD-10-CM | POA: Diagnosis not present

## 2018-04-10 NOTE — Evaluation (Signed)
Occupational Therapy Evaluation Patient Details Name: Maureen Duncan MRN: [id number] DOB: [birth year] Today's Date: 04/10/2018    History of Present Illness Pt is a 67 y.o. female s/p elective L TKA on 04/07/18. PMH includes HTN, breast CA, arthritis, DVT.   Clinical Impression   PTA patient was independent with ADL, IADL, and mobility.  Patient is currently limited by decreased ROM and pain in L LE after elective TKA, impaired balance, decreased activity tolerance, and decreased safety. She demonstrates ability to complete UB ADL with supervision, LB ADL with min guard assist, toileting/toilet transfers using RW with min guard assist.  Addressed tub transfers due to patient dcing to daughters home, but unsuccessful attempt with inability to clear L LE over "tub ledge"; patient provided other options to complete but voiced preference to complete basin baths until stronger/dc to her home.  Patient educated on precautions, AE, DME, safety, and energy conservation in regards to participation in ADLs.  Reports no further questions or concerns at this time.  Recommend 24/7 assistance at discharge, but no f/u OT services.  Will continue to follow while admitted in order to maximize safety and independence prior to dc home with family support.     Follow Up Recommendations  No OT follow up;Supervision/Assistance - 24 hour    Equipment Recommendations  Other (comment)(all needs met)    Recommendations for Other Services       Precautions / Restrictions Precautions Precautions: Fall;Knee Precaution Booklet Issued: No Precaution Comments: Verbally reviewed precautions, booklet issued during prior PT session  Restrictions Weight Bearing Restrictions: Yes LLE Weight Bearing: Weight bearing as tolerated      Mobility Bed Mobility Overal bed mobility: Needs Assistance Bed Mobility: Supine to Sit;Sit to Supine     Supine to sit: Supervision     General bed mobility comments: seated in  recliner upon entry   Transfers Overall transfer level: Needs assistance Equipment used: Rolling walker (2 wheeled) Transfers: Sit to/from Stand Sit to Stand: Min guard         General transfer comment: min guard to supervision for transfers, cueing for hand placement and safety     Balance Overall balance assessment: Needs assistance Sitting-balance support: No upper extremity supported;Feet supported Sitting balance-Leahy Scale: Fair     Standing balance support: Bilateral upper extremity supported Standing balance-Leahy Scale: Poor Standing balance comment: Heavy reliance on BUE support                           ADL either performed or assessed with clinical judgement   ADL Overall ADL's : Needs assistance/impaired Eating/Feeding: Modified independent   Grooming: Supervision/safety;Cueing for safety;Standing   Upper Body Bathing: Supervision/ safety;Sitting   Lower Body Bathing: Min guard;Sit to/from stand Lower Body Bathing Details (indicate cue type and reason): anticipate min guard standing to bath buttocks/peri area  Upper Body Dressing : Set up;Sitting   Lower Body Dressing: Min guard;Sit to/from stand Lower Body Dressing Details (indicate cue type and reason): increased time to reach L foot to don/doff socks, intiated education with AE per patient request but determined increased ease with completing without; anticipate min guard to close supervision to pull pants over hips  Toilet Transfer: Min guard;Ambulation;RW Toilet Transfer Details (indicate cue type and reason): simulated with recliner, cueing for hand placement and safety; increased time required    Toileting- Clothing Manipulation and Hygiene: Min guard;Sit to/from stand     Tub/Shower Transfer Details (indicate cue type and reason):  attempted simulated tub transfer (as dcing to daughters home) patient unable to step L LE over ledge and reports she will just sponge bathe until she goes to her  home, as purchasing a tub transfer bench would be extreme for such as short time  Functional mobility during ADLs: Min guard;Rolling walker General ADL Comments: min guard to supervision using rolling walker, motivated and requires increased time for all tasks     Vision Baseline Vision/History: Wears glasses Wears Glasses: Reading only Patient Visual Report: No change from baseline       Perception     Praxis      Pertinent Vitals/Pain Pain Assessment: Faces Pain Score: 7  Faces Pain Scale: Hurts even more Pain Location: L knee Pain Descriptors / Indicators: Operative site guarding;Grimacing;Tightness;Tender Pain Intervention(s): Limited activity within patient's tolerance;Monitored during session     Hand Dominance     Extremity/Trunk Assessment Upper Extremity Assessment Upper Extremity Assessment: Overall WFL for tasks assessed   Lower Extremity Assessment Lower Extremity Assessment: LLE deficits/detail LLE Deficits / Details: s/p L TKA       Communication Communication Communication: No difficulties   Cognition Arousal/Alertness: Awake/alert Behavior During Therapy: WFL for tasks assessed/performed Overall Cognitive Status: Within Functional Limits for tasks assessed                                     General Comments          Shoulder Instructions      Home Living Family/patient expects to be discharged to:: Private residence Living Arrangements: Alone Available Help at Discharge: Family;Available 24 hours/day Type of Home: House Home Access: Level entry     Home Layout: One level     Bathroom Shower/Tub: Occupational psychologist: Standard     Home Equipment: Environmental consultant - 2 wheels;Bedside commode   Additional Comments: Plans to discharge to daughter's home for as long as needed (has tub shower combo)      Prior Functioning/Environment Level of Independence: Independent        Comments: reports independent with ADL,  IADL, mobility and working         OT Problem List: Decreased strength;Impaired balance (sitting and/or standing);Pain;Decreased range of motion;Decreased knowledge of use of DME or AE;Decreased activity tolerance;Decreased safety awareness      OT Treatment/Interventions: Self-care/ADL training;DME and/or AE instruction;Therapeutic activities;Balance training;Energy conservation;Patient/family education    OT Goals(Current goals can be found in the care plan section) Acute Rehab OT Goals Patient Stated Goal: Less pain OT Goal Formulation: With patient Time For Goal Achievement: 04/24/18 Potential to Achieve Goals: Good  OT Frequency: Min 2X/week   Barriers to D/C:            Co-evaluation              AM-PAC PT "6 Clicks" Daily Activity     Outcome Measure Help from another person eating meals?: None Help from another person taking care of personal grooming?: A Little Help from another person toileting, which includes using toliet, bedpan, or urinal?: A Little Help from another person bathing (including washing, rinsing, drying)?: A Little Help from another person to put on and taking off regular upper body clothing?: None Help from another person to put on and taking off regular lower body clothing?: A Little 6 Click Score: 20   End of Session Equipment Utilized During Treatment: Gait belt;Rolling walker CPM Left  Knee CPM Left Knee: On Left Knee Flexion (Degrees): 40 Left Knee Extension (Degrees): 0  Activity Tolerance: Patient tolerated treatment well;No increased pain Patient left: in chair;with call bell/phone within reach  OT Visit Diagnosis: Unsteadiness on feet (R26.81);Other abnormalities of gait and mobility (R26.89);Muscle weakness (generalized) (M62.81)                Time: 9355-2174 OT Time Calculation (min): 33 min Charges:  OT General Charges $OT Visit: 1 Visit OT Evaluation $OT Eval Moderate Complexity: 1 Mod OT Treatments $Self Care/Home  Management : 8-22 mins G-Codes:     Delight Stare, OTR/L  Pager [phone]  Delight Stare 04/10/2018, 11:54 AM

## 2018-04-10 NOTE — Plan of Care (Signed)
  Problem: Activity: Goal: Ability to avoid complications of mobility impairment will improve Outcome: Adequate for Discharge Goal: Range of joint motion will improve Outcome: Progressing   Problem: Clinical Measurements: Goal: Postoperative complications will be avoided or minimized Outcome: Adequate for Discharge   Problem: Pain Management: Goal: Pain level will decrease with appropriate interventions Outcome: Adequate for Discharge

## 2018-04-10 NOTE — Progress Notes (Signed)
Subjective: Pt stable - PT ok - more stair work this am   Objective: Vital signs in last 24 hours: Temp:  [98.7 F (37.1 C)-98.9 F (37.2 C)] 98.7 F (37.1 C) (06/14 0436) Pulse Rate:  [87-98] 87 (06/14 0436) Resp:  [16-20] 16 (06/14 0436) BP: (103-134)/(67-86) 114/67 (06/14 0436) SpO2:  [95 %-97 %] 96 % (06/14 0436)  Intake/Output from previous day: 06/13 0701 - 06/14 0700 In: 480 [P.O.:480] Out: -  Intake/Output this shift: No intake/output data recorded.  Exam:  Neurovascular intact Sensation intact distally  Labs: No results for input(s): HGB in the last 72 hours. No results for input(s): WBC, RBC, HCT, PLT in the last 72 hours. No results for input(s): NA, K, CL, CO2, BUN, CREATININE, GLUCOSE, CALCIUM in the last 72 hours. No results for input(s): LABPT, INR in the last 72 hours.  Assessment/Plan: Plan dc after PT this am   G Alphonzo Severance 04/10/2018, 8:01 AM

## 2018-04-10 NOTE — Progress Notes (Signed)
Physical Therapy Treatment Patient Details Name: Maureen Duncan MRN: [id number] DOB: 26-Jul-1951 Today's Date: 04/10/2018    History of Present Illness Pt is a 67 y.o. female s/p elective L TKA on 04/07/18. PMH includes HTN, breast CA, arthritis, DVT.    PT Comments    Pt performed gait training and functional mobility with min guard to supervision assistance.  Pt with improved tolerance to stair training.  Pt will require assistance at home to negotiate stairs as she continues to require minimal assistance.  Pt agreeable to purchase cane for stair negotiation at d/c.  Plan for return home with support from her daughter.  Pt will benefit from HHPT and continued HEP use.  Pt educated on frequency of HEP.  Pt is safe to d/c home from a mobility stand point.    Follow Up Recommendations  Follow surgeon's recommendation for DC plan and follow-up therapies;Supervision for mobility/OOB     Equipment Recommendations  None recommended by PT    Recommendations for Other Services       Precautions / Restrictions Precautions Precautions: Fall;Knee Precaution Booklet Issued: Yes (comment) Precaution Comments: Verbally reviewed precautions Restrictions Weight Bearing Restrictions: Yes LLE Weight Bearing: Weight bearing as tolerated    Mobility  Bed Mobility Overal bed mobility: Needs Assistance Bed Mobility: Supine to Sit;Sit to Supine     Supine to sit: Supervision     General bed mobility comments: Pt remains to require increased time but able to achieve sitting edge of bed.    Transfers Overall transfer level: Needs assistance Equipment used: Rolling walker (2 wheeled) Transfers: Sit to/from Stand Sit to Stand: Supervision         General transfer comment: Pt remains to require cues for hand placement to and from seated surface.    Ambulation/Gait Ambulation/Gait assistance: Min guard Gait Distance (Feet): 120 Feet Assistive device: Rolling walker (2 wheeled) Gait  Pattern/deviations: Trunk flexed;Antalgic;Shuffle;Step-through pattern Gait velocity: decreased   General Gait Details: Pt with improved step length, remains to require cues for upper trunk control.     Stairs Stairs: Yes Stairs assistance: Min assist(assist level varried.  ) Stair Management: One rail Right;With cane Number of Stairs: 6 General stair comments: Pt performed increased stair training this am. Performed with L railing and step to pattern.  Cane in L hand and R hand on left railing facing stairs sideways.  Pt required min assistance for safety.      Wheelchair Mobility    Modified Rankin (Stroke Patients Only)       Balance Overall balance assessment: Needs assistance   Sitting balance-Leahy Scale: Fair       Standing balance-Leahy Scale: Poor Standing balance comment: Heavy reliance on BUE support                            Cognition Arousal/Alertness: Awake/alert Behavior During Therapy: WFL for tasks assessed/performed Overall Cognitive Status: Within Functional Limits for tasks assessed                                        Exercises Total Joint Exercises Ankle Circles/Pumps: AROM;Both;10 reps;Supine Quad Sets: AROM;Right;10 reps;Supine Towel Squeeze: AROM;Both;10 reps;Supine Short Arc Quad: Right;10 reps;Supine;AAROM Heel Slides: AAROM;Right;10 reps;Supine Hip ABduction/ADduction: AAROM;Right;Supine;10 reps Straight Leg Raises: AAROM;Right;10 reps;Supine Goniometric ROM: 50 degrees flexion in R knee.     General Comments  Pertinent Vitals/Pain Pain Assessment: 0-10 Pain Score: 7  Pain Location: L knee Pain Descriptors / Indicators: Crying;Operative site guarding;Grimacing Pain Intervention(s): Monitored during session;Repositioned;Ice applied    Home Living                      Prior Function            PT Goals (current goals can now be found in the care plan section) Acute Rehab PT  Goals Patient Stated Goal: Less pain Potential to Achieve Goals: Good Progress towards PT goals: Progressing toward goals    Frequency    7X/week      PT Plan Current plan remains appropriate    Co-evaluation              AM-PAC PT "6 Clicks" Daily Activity  Outcome Measure  Difficulty turning over in bed (including adjusting bedclothes, sheets and blankets)?: Unable Difficulty moving from lying on back to sitting on the side of the bed? : Unable Difficulty sitting down on and standing up from a chair with arms (e.g., wheelchair, bedside commode, etc,.)?: Unable Help needed moving to and from a bed to chair (including a wheelchair)?: A Little Help needed walking in hospital room?: A Little Help needed climbing 3-5 steps with a railing? : A Little 6 Click Score: 12    End of Session Equipment Utilized During Treatment: Gait belt Activity Tolerance: Patient limited by pain;Patient tolerated treatment well Patient left: with call bell/phone within reach;with family/visitor present;with nursing/sitter in room;in chair Nurse Communication: Mobility status PT Visit Diagnosis: Other abnormalities of gait and mobility (R26.89)     Time: 7342-8768 PT Time Calculation (min) (ACUTE ONLY): 37 min  Charges:  $Gait Training: 8-22 mins $Therapeutic Exercise: 8-22 mins                    G Codes:       Governor Rooks, PTA pager [place] 04/10/2018, 10:47 AM

## 2018-04-10 NOTE — Progress Notes (Signed)
Pt given discharge instructions and gone over with her. Answered all questions to satisfaction. All belongings gathered to be sent home. Pt waiting for ride. In no distress at time of discharge.

## 2018-04-12 DIAGNOSIS — M810 Age-related osteoporosis without current pathological fracture: Secondary | ICD-10-CM | POA: Diagnosis not present

## 2018-04-12 DIAGNOSIS — Z96652 Presence of left artificial knee joint: Secondary | ICD-10-CM | POA: Diagnosis not present

## 2018-04-12 DIAGNOSIS — I10 Essential (primary) hypertension: Secondary | ICD-10-CM | POA: Diagnosis not present

## 2018-04-12 DIAGNOSIS — I447 Left bundle-branch block, unspecified: Secondary | ICD-10-CM | POA: Diagnosis not present

## 2018-04-12 DIAGNOSIS — Z87891 Personal history of nicotine dependence: Secondary | ICD-10-CM | POA: Diagnosis not present

## 2018-04-12 DIAGNOSIS — Z86718 Personal history of other venous thrombosis and embolism: Secondary | ICD-10-CM | POA: Diagnosis not present

## 2018-04-12 DIAGNOSIS — Z471 Aftercare following joint replacement surgery: Secondary | ICD-10-CM | POA: Diagnosis not present

## 2018-04-12 DIAGNOSIS — Z853 Personal history of malignant neoplasm of breast: Secondary | ICD-10-CM | POA: Diagnosis not present

## 2018-04-12 DIAGNOSIS — Z7901 Long term (current) use of anticoagulants: Secondary | ICD-10-CM | POA: Diagnosis not present

## 2018-04-12 DIAGNOSIS — I890 Lymphedema, not elsewhere classified: Secondary | ICD-10-CM | POA: Diagnosis not present

## 2018-04-13 DIAGNOSIS — Z471 Aftercare following joint replacement surgery: Secondary | ICD-10-CM | POA: Diagnosis not present

## 2018-04-13 DIAGNOSIS — M810 Age-related osteoporosis without current pathological fracture: Secondary | ICD-10-CM | POA: Diagnosis not present

## 2018-04-13 DIAGNOSIS — Z853 Personal history of malignant neoplasm of breast: Secondary | ICD-10-CM | POA: Diagnosis not present

## 2018-04-13 DIAGNOSIS — Z87891 Personal history of nicotine dependence: Secondary | ICD-10-CM | POA: Diagnosis not present

## 2018-04-13 DIAGNOSIS — Z86718 Personal history of other venous thrombosis and embolism: Secondary | ICD-10-CM | POA: Diagnosis not present

## 2018-04-13 DIAGNOSIS — I10 Essential (primary) hypertension: Secondary | ICD-10-CM | POA: Diagnosis not present

## 2018-04-13 DIAGNOSIS — I890 Lymphedema, not elsewhere classified: Secondary | ICD-10-CM | POA: Diagnosis not present

## 2018-04-13 DIAGNOSIS — Z96652 Presence of left artificial knee joint: Secondary | ICD-10-CM | POA: Diagnosis not present

## 2018-04-13 DIAGNOSIS — I447 Left bundle-branch block, unspecified: Secondary | ICD-10-CM | POA: Diagnosis not present

## 2018-04-13 DIAGNOSIS — Z7901 Long term (current) use of anticoagulants: Secondary | ICD-10-CM | POA: Diagnosis not present

## 2018-04-15 DIAGNOSIS — I890 Lymphedema, not elsewhere classified: Secondary | ICD-10-CM | POA: Diagnosis not present

## 2018-04-15 DIAGNOSIS — I10 Essential (primary) hypertension: Secondary | ICD-10-CM | POA: Diagnosis not present

## 2018-04-15 DIAGNOSIS — Z96652 Presence of left artificial knee joint: Secondary | ICD-10-CM | POA: Diagnosis not present

## 2018-04-15 DIAGNOSIS — M810 Age-related osteoporosis without current pathological fracture: Secondary | ICD-10-CM | POA: Diagnosis not present

## 2018-04-15 DIAGNOSIS — Z7901 Long term (current) use of anticoagulants: Secondary | ICD-10-CM | POA: Diagnosis not present

## 2018-04-15 DIAGNOSIS — Z471 Aftercare following joint replacement surgery: Secondary | ICD-10-CM | POA: Diagnosis not present

## 2018-04-15 DIAGNOSIS — Z853 Personal history of malignant neoplasm of breast: Secondary | ICD-10-CM | POA: Diagnosis not present

## 2018-04-15 DIAGNOSIS — Z87891 Personal history of nicotine dependence: Secondary | ICD-10-CM | POA: Diagnosis not present

## 2018-04-15 DIAGNOSIS — I447 Left bundle-branch block, unspecified: Secondary | ICD-10-CM | POA: Diagnosis not present

## 2018-04-15 DIAGNOSIS — Z86718 Personal history of other venous thrombosis and embolism: Secondary | ICD-10-CM | POA: Diagnosis not present

## 2018-04-16 ENCOUNTER — Telehealth (INDEPENDENT_AMBULATORY_CARE_PROVIDER_SITE_OTHER): Payer: Self-pay | Admitting: Orthopedic Surgery

## 2018-04-16 NOTE — Telephone Encounter (Signed)
Ok to rf? 

## 2018-04-16 NOTE — Telephone Encounter (Signed)
Patient called needing Rx refilled (Oxycodone) The number to contact patient is [phone]

## 2018-04-17 ENCOUNTER — Telehealth (INDEPENDENT_AMBULATORY_CARE_PROVIDER_SITE_OTHER): Payer: Self-pay

## 2018-04-17 MED ORDER — OXYCODONE HCL 5 MG PO TABS: ORAL_TABLET | ORAL | 0 refills | Status: DC

## 2018-04-17 NOTE — Telephone Encounter (Signed)
Pt called and she states that she is s/p surgery on Tuesday and she noticed a lot of ankle swelling. There is no warmth, tenderness to touch or any other problems she is just concerned about the ankle swelling not wearing any compression socks. cb # [phone]

## 2018-04-17 NOTE — Telephone Encounter (Signed)
Please advise. Thanks.  

## 2018-04-17 NOTE — Telephone Encounter (Signed)
IC advised could pick up rx at front desk to take to pharmacy.

## 2018-04-17 NOTE — Telephone Encounter (Signed)
y

## 2018-04-18 DIAGNOSIS — Z87891 Personal history of nicotine dependence: Secondary | ICD-10-CM | POA: Diagnosis not present

## 2018-04-18 DIAGNOSIS — Z7901 Long term (current) use of anticoagulants: Secondary | ICD-10-CM | POA: Diagnosis not present

## 2018-04-18 DIAGNOSIS — I447 Left bundle-branch block, unspecified: Secondary | ICD-10-CM | POA: Diagnosis not present

## 2018-04-18 DIAGNOSIS — Z471 Aftercare following joint replacement surgery: Secondary | ICD-10-CM | POA: Diagnosis not present

## 2018-04-18 DIAGNOSIS — M810 Age-related osteoporosis without current pathological fracture: Secondary | ICD-10-CM | POA: Diagnosis not present

## 2018-04-18 DIAGNOSIS — Z86718 Personal history of other venous thrombosis and embolism: Secondary | ICD-10-CM | POA: Diagnosis not present

## 2018-04-18 DIAGNOSIS — I890 Lymphedema, not elsewhere classified: Secondary | ICD-10-CM | POA: Diagnosis not present

## 2018-04-18 DIAGNOSIS — Z96652 Presence of left artificial knee joint: Secondary | ICD-10-CM | POA: Diagnosis not present

## 2018-04-18 DIAGNOSIS — Z853 Personal history of malignant neoplasm of breast: Secondary | ICD-10-CM | POA: Diagnosis not present

## 2018-04-18 DIAGNOSIS — I10 Essential (primary) hypertension: Secondary | ICD-10-CM | POA: Diagnosis not present

## 2018-04-20 DIAGNOSIS — Z853 Personal history of malignant neoplasm of breast: Secondary | ICD-10-CM | POA: Diagnosis not present

## 2018-04-20 DIAGNOSIS — Z7901 Long term (current) use of anticoagulants: Secondary | ICD-10-CM | POA: Diagnosis not present

## 2018-04-20 DIAGNOSIS — I890 Lymphedema, not elsewhere classified: Secondary | ICD-10-CM | POA: Diagnosis not present

## 2018-04-20 DIAGNOSIS — Z471 Aftercare following joint replacement surgery: Secondary | ICD-10-CM | POA: Diagnosis not present

## 2018-04-20 DIAGNOSIS — I447 Left bundle-branch block, unspecified: Secondary | ICD-10-CM | POA: Diagnosis not present

## 2018-04-20 DIAGNOSIS — Z87891 Personal history of nicotine dependence: Secondary | ICD-10-CM | POA: Diagnosis not present

## 2018-04-20 DIAGNOSIS — Z96652 Presence of left artificial knee joint: Secondary | ICD-10-CM | POA: Diagnosis not present

## 2018-04-20 DIAGNOSIS — I10 Essential (primary) hypertension: Secondary | ICD-10-CM | POA: Diagnosis not present

## 2018-04-20 DIAGNOSIS — M810 Age-related osteoporosis without current pathological fracture: Secondary | ICD-10-CM | POA: Diagnosis not present

## 2018-04-20 DIAGNOSIS — Z86718 Personal history of other venous thrombosis and embolism: Secondary | ICD-10-CM | POA: Diagnosis not present

## 2018-04-21 DIAGNOSIS — I890 Lymphedema, not elsewhere classified: Secondary | ICD-10-CM | POA: Diagnosis not present

## 2018-04-21 DIAGNOSIS — Z471 Aftercare following joint replacement surgery: Secondary | ICD-10-CM | POA: Diagnosis not present

## 2018-04-21 DIAGNOSIS — Z96652 Presence of left artificial knee joint: Secondary | ICD-10-CM | POA: Diagnosis not present

## 2018-04-21 DIAGNOSIS — Z86718 Personal history of other venous thrombosis and embolism: Secondary | ICD-10-CM | POA: Diagnosis not present

## 2018-04-21 DIAGNOSIS — Z87891 Personal history of nicotine dependence: Secondary | ICD-10-CM | POA: Diagnosis not present

## 2018-04-21 DIAGNOSIS — Z7901 Long term (current) use of anticoagulants: Secondary | ICD-10-CM | POA: Diagnosis not present

## 2018-04-21 DIAGNOSIS — M810 Age-related osteoporosis without current pathological fracture: Secondary | ICD-10-CM | POA: Diagnosis not present

## 2018-04-21 DIAGNOSIS — I447 Left bundle-branch block, unspecified: Secondary | ICD-10-CM | POA: Diagnosis not present

## 2018-04-21 DIAGNOSIS — I10 Essential (primary) hypertension: Secondary | ICD-10-CM | POA: Diagnosis not present

## 2018-04-21 DIAGNOSIS — Z853 Personal history of malignant neoplasm of breast: Secondary | ICD-10-CM | POA: Diagnosis not present

## 2018-04-22 ENCOUNTER — Ambulatory Visit (INDEPENDENT_AMBULATORY_CARE_PROVIDER_SITE_OTHER): Payer: Federal, State, Local not specified - PPO | Admitting: Orthopedic Surgery

## 2018-04-22 ENCOUNTER — Ambulatory Visit (INDEPENDENT_AMBULATORY_CARE_PROVIDER_SITE_OTHER): Payer: Federal, State, Local not specified - PPO

## 2018-04-22 ENCOUNTER — Encounter (INDEPENDENT_AMBULATORY_CARE_PROVIDER_SITE_OTHER): Payer: Self-pay | Admitting: Orthopedic Surgery

## 2018-04-22 DIAGNOSIS — Z96652 Presence of left artificial knee joint: Secondary | ICD-10-CM

## 2018-04-22 NOTE — Addendum Note (Signed)
Addended byLaurann Montana on: 04/22/2018 09:26 AM   Modules accepted: Orders

## 2018-04-22 NOTE — Telephone Encounter (Signed)
She is ok

## 2018-04-22 NOTE — Progress Notes (Signed)
Post-Op Visit Note   Patient: JAIRA CANADY           Date of Birth: 08-Aug-1951           MRN: [id number] Visit Date: 04/22/2018 PCP: Deland Pretty, MD   Assessment & Plan:  Chief Complaint:  Chief Complaint  Patient presents with  . Left Knee - Routine Post Op   Visit Diagnoses:  1. Status post total left knee replacement     Plan: [name] is a patient is now 2 weeks out left total knee replacement.  Patient's been doing reasonably well.  She does report some pain.  On exam she comes within 5 degrees of full extension and has about 70 degrees of flexion.  Radiographs look good.  Plan is to start outpatient physical therapy.  No calf tenderness today.  She does have a history of upper arm DVT.  She is on Xarelto.  Plan is to follow-up in 2 weeks and start outpatient physical therapy.  Follow-Up Instructions: No follow-ups on file.   Orders:  Orders Placed This Encounter  Procedures  . XR Knee 1-2 Views Left   No orders of the defined types were placed in this encounter.   Imaging: Xr Knee 1-2 Views Left  Result Date: 04/22/2018 AP lateral left knee reviewed.  Press-fit total knee prosthesis in good position and alignment with no complicating features.   PMFS History: Patient Active Problem List   Diagnosis Date Noted  . Arthritis of knee 04/07/2018  . Unilateral primary osteoarthritis, left knee   . Prolapse of female bladder, acquired 05/08/2015  . Right elbow pain 07/25/2014  . Hyperlipidemia 04/28/2014  . Allergic rhinitis 04/28/2014  . Breast cancer, right. T2, N1. Mastectomy 01/30/2012. 01/09/2012   Past Medical History:  Diagnosis Date  . Arthritis   . Breast cancer [facility]) right breast dx 12/ 2000;  recurrence right breast 01/30/12   oncologist-  dr Marin Olp--  Stage IIb, grade 3, (T2n1M0) ductal carcinoma right breast-ER negative/ HER-2 positive---  s/p  right modified mastectomy with node dissection (3 positive) and chemotherapy (ended aug 2013) and Herceptin  ended Aug 2014  . Cystocele   . GERD (gastroesophageal reflux disease)    occasionally, takes Zantac OTC when needed  . History of DVT (deep vein thrombosis)    left subclavian vein -- port of PAC--  removed 06-23-2012  . Hypertension   . LBBB (left bundle branch block)   . Lymphedema of upper extremity    right arm  . Postmenopausal osteoporosis   . Uterine prolapse   . Wears partial dentures    upper    Family History  Problem Relation Age of Onset  . Diabetes Mother   . Hypertension Mother   . Kidney failure Mother   . Kidney disease Mother   . Cancer Mother        pancreatic  . Other Brother        heart transplant  . Cancer Maternal Uncle        lung    Past Surgical History:  Procedure Laterality Date  . BREAST LUMPECTOMY Right Jan 2001  . COLONOSCOPY  last one -- June 2016   WNL  . CYSTOCELE REPAIR N/A 05/08/2015   Procedure: ANTERIOR REPAIR (CYSTOCELE);  Surgeon: Molli Posey, MD;  Location: [facility];  Service: Gynecology;  Laterality: N/A;  . LAPAROSCOPIC ASSISTED VAGINAL HYSTERECTOMY N/A 05/08/2015   Procedure: LAPAROSCOPIC ASSISTED VAGINAL HYSTERECTOMY;  Surgeon: Molli Posey, MD;  Location:  [facility];  Service: Gynecology;  Laterality: N/A;  . LAPAROSCOPIC BILATERAL SALPINGO OOPHERECTOMY Bilateral 05/08/2015   Procedure: LAPAROSCOPIC BILATERAL SALPINGO OOPHORECTOMY;  Surgeon: Richard Holland, MD;  Location: [facility];  Service: Gynecology;  Laterality: Bilateral;  . MODIFIED RADICAL MASTECTOMY W/ AXILLARY LYMPH NODE DISSECTION Right 01/30/12  . PORT-A-CATH REMOVAL  06/23/2012   Procedure: MINOR REMOVAL PORT-A-CATH;  Surgeon: David H Newman, MD;  Location: [facility];  Service: General;  Laterality: Left;  . PORTACATH PLACEMENT  01/30/2012   Procedure: INSERTION PORT-A-CATH;  Surgeon: David H Newman, MD;  Location: [facility];  Service: General;  Laterality: Left;  . TOTAL KNEE ARTHROPLASTY Left  04/07/2018  . TOTAL KNEE ARTHROPLASTY Left 04/07/2018   Procedure: LEFT TOTAL KNEE ARTHROPLASTY;  Surgeon: Dean, Gregory Scott, MD;  Location: [facility];  Service: Orthopedics;  Laterality: Left;  . TRANSTHORACIC ECHOCARDIOGRAM  08-24-2012   grade I diastolic dysfunction,  ef 45-50%/  trivial MR  . TUBAL LIGATION  1980   Social History   Occupational History  . Not on file  Tobacco Use  . Smoking status: Former Smoker    Packs/day: 0.25    Years: 2.00    Pack years: 0.50    Types: Cigarettes    Start date: 03/31/2010    Last attempt to quit: 11/11/2011    Years since quitting: 6.4  . Smokeless tobacco: Never Used  . Tobacco comment: social smoker  Substance and Sexual Activity  . Alcohol use: Yes    Alcohol/week: 0.0 oz    Comment: occasional  . Drug use: No  . Sexual activity: Not on file    

## 2018-04-25 DIAGNOSIS — Z86718 Personal history of other venous thrombosis and embolism: Secondary | ICD-10-CM | POA: Diagnosis not present

## 2018-04-25 DIAGNOSIS — Z87891 Personal history of nicotine dependence: Secondary | ICD-10-CM | POA: Diagnosis not present

## 2018-04-25 DIAGNOSIS — Z471 Aftercare following joint replacement surgery: Secondary | ICD-10-CM | POA: Diagnosis not present

## 2018-04-25 DIAGNOSIS — M810 Age-related osteoporosis without current pathological fracture: Secondary | ICD-10-CM | POA: Diagnosis not present

## 2018-04-25 DIAGNOSIS — Z853 Personal history of malignant neoplasm of breast: Secondary | ICD-10-CM | POA: Diagnosis not present

## 2018-04-25 DIAGNOSIS — Z7901 Long term (current) use of anticoagulants: Secondary | ICD-10-CM | POA: Diagnosis not present

## 2018-04-25 DIAGNOSIS — I890 Lymphedema, not elsewhere classified: Secondary | ICD-10-CM | POA: Diagnosis not present

## 2018-04-25 DIAGNOSIS — I10 Essential (primary) hypertension: Secondary | ICD-10-CM | POA: Diagnosis not present

## 2018-04-25 DIAGNOSIS — I447 Left bundle-branch block, unspecified: Secondary | ICD-10-CM | POA: Diagnosis not present

## 2018-04-25 DIAGNOSIS — Z96652 Presence of left artificial knee joint: Secondary | ICD-10-CM | POA: Diagnosis not present

## 2018-04-28 ENCOUNTER — Other Ambulatory Visit (INDEPENDENT_AMBULATORY_CARE_PROVIDER_SITE_OTHER): Payer: Self-pay | Admitting: Orthopedic Surgery

## 2018-04-28 NOTE — Telephone Encounter (Signed)
Ok to rf x 1 for 3 more weeks then we will dc xarelto

## 2018-04-28 NOTE — Telephone Encounter (Signed)
Refill

## 2018-05-01 DIAGNOSIS — M25562 Pain in left knee: Secondary | ICD-10-CM | POA: Diagnosis not present

## 2018-05-04 NOTE — Discharge Summary (Signed)
Physician Discharge Summary  Patient ID: Maureen Duncan MRN: [id number] DOB/AGE: [birth year] 67 y.o.  Admit date: 04/07/2018 Discharge date: 04/10/2018  Admission Diagnoses:  Active Problems:   Arthritis of knee   Unilateral primary osteoarthritis, left knee   Discharge Diagnoses:  Same  Surgeries: Procedure(s): LEFT TOTAL KNEE ARTHROPLASTY on 04/07/2018   Consultants:   Discharged Condition: Stable  Hospital Course: Maureen Duncan is an 67 y.o. female who was admitted 04/07/2018 with a chief complaint of left knee pain, and found to have a diagnosis of left knee arthritis.  They were brought to the operating room on 04/07/2018 and underwent the above named procedures.  Patient tolerated the procedure well without immediate complications.  She was started on CPM machine for motion and physical therapy for strengthening.  Patient made good recovery and was ambulating in the hallway by the time of discharge.  She is discharged home in good condition and will follow-up with me in 1 week.  CPM machine to continue.  Incision intact at the time of discharge.  Antibiotics given:  Anti-infectives (From admission, onward)   Start     Dose/Rate Route Frequency Ordered Stop   04/07/18 1600  ceFAZolin (ANCEF) IVPB 2g/100 mL premix     2 g 200 mL/hr over 30 Minutes Intravenous Every 6 hours 04/07/18 1234 04/08/18 0017   04/07/18 0600  ceFAZolin (ANCEF) IVPB 2g/100 mL premix     2 g 200 mL/hr over 30 Minutes Intravenous To [facility]Stay [facility] 04/06/18 1422 04/07/18 0759    .  Recent vital signs:  Vitals:   04/10/18 0436 04/10/18 1038  BP: 114/67   Pulse: 87   Resp: 16   Temp: 98.7 F (37.1 C) 98.5 F (36.9 C)  SpO2: 96%     Recent laboratory studies:  Results for orders placed or performed during the hospital encounter of 04/07/18  Glucose, capillary  Result Value Ref Range   Glucose-Capillary 117 (H) 65 - 99 mg/dL   Comment 1 Notify RN     Discharge Medications:   Allergies as  of 04/10/2018      Reactions   Adhesive [tape] Rash      Medication List    STOP taking these medications   aspirin 81 MG tablet   ibuprofen 200 MG tablet Commonly known as:  ADVIL,MOTRIN     TAKE these medications   acetaminophen 500 MG tablet Commonly known as:  TYLENOL Take 1,000 mg by mouth every 8 (eight) hours as needed for mild pain or fever.   amLODipine-benazepril 5-20 MG capsule Commonly known as:  LOTREL Take 1 capsule by mouth daily.   ASPERCREME LIDOCAINE EX Apply 1 application topically 2 (two) times daily as needed (Knee pain).   atorvastatin 40 MG tablet Commonly known as:  LIPITOR Take 40 mg by mouth daily.   CALCIUM 600 + D PO Take 1 tablet by mouth daily.   diphenhydrAMINE 25 MG tablet Commonly known as:  BENADRYL Take 50 mg by mouth daily as needed for itching or allergies.   diphenhydramine-acetaminophen 25-500 MG Tabs tablet Commonly known as:  TYLENOL PM Take 1 tablet by mouth at bedtime as needed (sleep).   docusate sodium 100 MG capsule Commonly known as:  COLACE Take 1 capsule (100 mg total) by mouth 2 (two) times daily.   fluticasone 50 MCG/ACT nasal spray Commonly known as:  FLONASE PLACE 2 SPRAYS INTO BOTH NOSTRILS AS NEEDED.   Melatonin 3 MG Tabs Take 2 tablets by mouth at bedtime  as needed (sleep).   methocarbamol 500 MG tablet Commonly known as:  ROBAXIN Take 1 tablet (500 mg total) by mouth every 6 (six) hours as needed for muscle spasms.   prednisoLONE acetate 1 % ophthalmic suspension Commonly known as:  PRED FORTE Place 1 drop into both eyes 2 (two) times daily as needed (eye infection).   ranitidine 150 MG tablet Commonly known as:  ZANTAC Take 150 mg by mouth daily as needed for heartburn.   Vitamin D (Ergocalciferol) 50000 units Caps capsule Commonly known as:  DRISDOL Take 1 capsule (50,000 Units total) by mouth every 7 (seven) days. What changed:  when to take this       Diagnostic Studies: Xr Knee 1-2  Views Left  Result Date: 04/22/2018 AP lateral left knee reviewed.  Press-fit total knee prosthesis in good position and alignment with no complicating features.   Disposition:   Discharge Instructions    Call MD / Call 911   Complete by:  As directed    If you experience chest pain or shortness of breath, CALL 911 and be transported to the hospital emergency room.  If you develope a fever above 101 F, pus (white drainage) or increased drainage or redness at the wound, or calf pain, call your surgeon's office.   Call MD / Call 911   Complete by:  As directed    If you experience chest pain or shortness of breath, CALL 911 and be transported to the hospital emergency room.  If you develope a fever above 101 F, pus (white drainage) or increased drainage or redness at the wound, or calf pain, call your surgeon's office.   Constipation Prevention   Complete by:  As directed    Drink plenty of fluids.  Prune juice may be helpful.  You may use a stool softener, such as Colace (over the counter) 100 mg twice a day.  Use MiraLax (over the counter) for constipation as needed.   Constipation Prevention   Complete by:  As directed    Drink plenty of fluids.  Prune juice may be helpful.  You may use a stool softener, such as Colace (over the counter) 100 mg twice a day.  Use MiraLax (over the counter) for constipation as needed.   Diet - low sodium heart healthy   Complete by:  As directed    Diet - low sodium heart healthy   Complete by:  As directed    Discharge instructions   Complete by:  As directed    Weightbearing as tolerated with walker CPM machine 1-1/2 hours 3 times a day increasing the degrees daily Work on achieving full extension with placing foot on chair and pressing the leg down Okay to remove dressing in 10 days and rewrap knee with Ace wrap Follow-up with me in 2 weeks   Increase activity slowly as tolerated   Complete by:  As directed    Increase activity slowly as tolerated    Complete by:  As directed       Follow-up Information    [facility]   Specialty:  Ignacio Why:  A representative from [facility] will contact you to arrange start date and time for your therapy. Contact information: [address] [place] [address] [phone]            Signed: Anderson Malta 05/04/2018, 9:08 AM

## 2018-05-06 ENCOUNTER — Ambulatory Visit (INDEPENDENT_AMBULATORY_CARE_PROVIDER_SITE_OTHER): Payer: Federal, State, Local not specified - PPO | Admitting: Orthopedic Surgery

## 2018-05-06 ENCOUNTER — Encounter (INDEPENDENT_AMBULATORY_CARE_PROVIDER_SITE_OTHER): Payer: Self-pay | Admitting: Orthopedic Surgery

## 2018-05-06 DIAGNOSIS — Z96652 Presence of left artificial knee joint: Secondary | ICD-10-CM

## 2018-05-10 ENCOUNTER — Encounter (INDEPENDENT_AMBULATORY_CARE_PROVIDER_SITE_OTHER): Payer: Self-pay | Admitting: Orthopedic Surgery

## 2018-05-10 NOTE — Progress Notes (Signed)
Post-Op Visit Note   Patient: Maureen Duncan           Date of Birth: [birth year]           MRN: [id number] Visit Date: 05/06/2018 PCP: Deland Pretty, MD   Assessment & Plan:  Chief Complaint:  Chief Complaint  Patient presents with  . Left Knee - Routine Post Op   Visit Diagnoses:  1. Status post total left knee replacement     Plan: She was a patient with left total knee replacement performed 04/07/2018.  She is doing well.  Not taking any medicine for pain.  She is been to therapy for 1 visit.  She wants to drive herself so she can go to appointments.  On exam she is weightbearing as tolerated and has range of motion 0 to just past 90.  Plan at this time is to let her continue with therapy and progress to home exercise program and continue with home exercise program.  55-month[age] for clinical recheck.  Driving is her decision but she is weightbearing and has flexion past 90 so I do not see it being a huge risk at this time.  I will see her back in 2 months for clinical recheck  Follow-Up Instructions: Return in about 8 weeks (around 07/01/2018).   Orders:  No orders of the defined types were placed in this encounter.  No orders of the defined types were placed in this encounter.   Imaging: No results found.  PMFS History: Patient Active Problem List   Diagnosis Date Noted  . Arthritis of knee 04/07/2018  . Unilateral primary osteoarthritis, left knee   . Prolapse of female bladder, acquired 05/08/2015  . Right elbow pain 07/25/2014  . Hyperlipidemia 04/28/2014  . Allergic rhinitis 04/28/2014  . Breast cancer, right. T2, N1. Mastectomy 01/30/2012. 01/09/2012   Past Medical History:  Diagnosis Date  . Arthritis   . Breast cancer ([facility] right breast dx 12/ 2000;  recurrence right breast 01/30/12   oncologist-  dr eMarin Olp-  Stage IIb, grade 3, (T2n1M0) ductal carcinoma right breast-ER negative/ HER-2 positive---  s/p  right modified mastectomy with node dissection (3  positive) and chemotherapy (ended aug 2013) and Herceptin ended Aug 2014  . Cystocele   . GERD (gastroesophageal reflux disease)    occasionally, takes Zantac OTC when needed  . History of DVT (deep vein thrombosis)    left subclavian vein -- port of PAC--  removed 06-23-2012  . Hypertension   . LBBB (left bundle branch block)   . Lymphedema of upper extremity    right arm  . Postmenopausal osteoporosis   . Uterine prolapse   . Wears partial dentures    upper    Family History  Problem Relation Age of Onset  . Diabetes Mother   . Hypertension Mother   . Kidney failure Mother   . Kidney disease Mother   . Cancer Mother        pancreatic  . Other Brother        heart transplant  . Cancer Maternal Uncle        lung    Past Surgical History:  Procedure Laterality Date  . BREAST LUMPECTOMY Right Jan 2001  . COLONOSCOPY  last one -- June 2016   WNL  . CYSTOCELE REPAIR N/A 05/08/2015   Procedure: ANTERIOR REPAIR (CYSTOCELE);  Surgeon: RMolli Posey MD;  [facility]  Service: Gynecology;  Laterality: N/A;  . LAPAROSCOPIC ASSISTED VAGINAL  HYSTERECTOMY N/A 05/08/2015   Procedure: LAPAROSCOPIC ASSISTED VAGINAL HYSTERECTOMY;  Surgeon: Molli Posey, MD;  Location: [facility];  Service: Gynecology;  Laterality: N/A;  . LAPAROSCOPIC BILATERAL SALPINGO OOPHERECTOMY Bilateral 05/08/2015   Procedure: LAPAROSCOPIC BILATERAL SALPINGO OOPHORECTOMY;  Surgeon: Molli Posey, MD;  Location: [place];  Service: Gynecology;  Laterality: Bilateral;  . MODIFIED RADICAL MASTECTOMY W/ AXILLARY LYMPH NODE DISSECTION Right 01/30/12  . PORT-A-CATH REMOVAL  06/23/2012   Procedure: MINOR REMOVAL PORT-A-CATH;  Surgeon: Shann Medal, MD;  Location: [place];  Service: General;  Laterality: Left;  . PORTACATH PLACEMENT  01/30/2012   Procedure: INSERTION PORT-A-CATH;  Surgeon: Shann Medal, MD;  Location: [place];  Service: General;   Laterality: Left;  . TOTAL KNEE ARTHROPLASTY Left 04/07/2018  . TOTAL KNEE ARTHROPLASTY Left 04/07/2018   Procedure: LEFT TOTAL KNEE ARTHROPLASTY;  Surgeon: Meredith Pel, MD;  Location: [place];  Service: Orthopedics;  Laterality: Left;  . TRANSTHORACIC ECHOCARDIOGRAM  08-24-2012   grade I diastolic dysfunction,  ef 45-50%/  trivial MR  . TUBAL LIGATION  1980   Social History   Occupational History  . Not on file  Tobacco Use  . Smoking status: Former Smoker    Packs/day: 0.25    Years: 2.00    Pack years: 0.50    Types: Cigarettes    Start date: 03/31/2010    Last attempt to quit: 11/11/2011    Years since quitting: 6.4  . Smokeless tobacco: Never Used  . Tobacco comment: social smoker  Substance and Sexual Activity  . Alcohol use: Yes    Alcohol/week: 0.0 oz    Comment: occasional  . Drug use: No  . Sexual activity: Not on file

## 2018-05-12 DIAGNOSIS — M25562 Pain in left knee: Secondary | ICD-10-CM | POA: Diagnosis not present

## 2018-05-14 DIAGNOSIS — D485 Neoplasm of uncertain behavior of skin: Secondary | ICD-10-CM | POA: Diagnosis not present

## 2018-05-14 DIAGNOSIS — M25562 Pain in left knee: Secondary | ICD-10-CM | POA: Diagnosis not present

## 2018-05-19 DIAGNOSIS — M25562 Pain in left knee: Secondary | ICD-10-CM | POA: Diagnosis not present

## 2018-05-21 ENCOUNTER — Telehealth (INDEPENDENT_AMBULATORY_CARE_PROVIDER_SITE_OTHER): Payer: Self-pay | Admitting: Orthopedic Surgery

## 2018-05-21 DIAGNOSIS — M25562 Pain in left knee: Secondary | ICD-10-CM | POA: Diagnosis not present

## 2018-05-21 NOTE — Telephone Encounter (Signed)
Please advise. Thanks.  

## 2018-05-21 NOTE — Telephone Encounter (Signed)
Patient called stating that she is having a little bit of pain in her knee.  She said she went to therapy on Tuesday and ever since she is having pain.  She wanted to know if Dr. Marlou Sa would prescribe her something for pain and to help her sleep at night due to the pain.  CB#[phone].  Thank you.

## 2018-05-22 MED ORDER — TRAMADOL HCL 50 MG PO TABS: 50.0000 mg | ORAL_TABLET | Freq: Three times a day (TID) | ORAL | 0 refills | Status: DC | PRN

## 2018-05-22 NOTE — Telephone Encounter (Signed)
Called to pharmacy. Patient advised done.  

## 2018-05-22 NOTE — Telephone Encounter (Signed)
Ok for tramadol 1 po q 8 # 30 pls clal thx

## 2018-05-26 DIAGNOSIS — M25562 Pain in left knee: Secondary | ICD-10-CM | POA: Diagnosis not present

## 2018-05-28 DIAGNOSIS — M25562 Pain in left knee: Secondary | ICD-10-CM | POA: Diagnosis not present

## 2018-06-02 DIAGNOSIS — M25562 Pain in left knee: Secondary | ICD-10-CM | POA: Diagnosis not present

## 2018-06-04 DIAGNOSIS — M25562 Pain in left knee: Secondary | ICD-10-CM | POA: Diagnosis not present

## 2018-07-06 ENCOUNTER — Ambulatory Visit (INDEPENDENT_AMBULATORY_CARE_PROVIDER_SITE_OTHER): Payer: Federal, State, Local not specified - PPO | Admitting: Orthopedic Surgery

## 2018-07-06 ENCOUNTER — Encounter (INDEPENDENT_AMBULATORY_CARE_PROVIDER_SITE_OTHER): Payer: Self-pay | Admitting: Orthopedic Surgery

## 2018-07-06 DIAGNOSIS — Z96652 Presence of left artificial knee joint: Secondary | ICD-10-CM

## 2018-07-06 NOTE — Progress Notes (Signed)
Post-Op Visit Note   Patient: Maureen Duncan           Date of Birth: 05-Dec-1950           MRN: [id number] Visit Date: 07/06/2018 PCP: Deland Pretty, MD   Assessment & Plan:  Chief Complaint:  Chief Complaint  Patient presents with  . Left Knee - Follow-up   Visit Diagnoses:  1. Status post total left knee replacement     Plan: [name] is now 3 months out left total knee replacement.  She is been doing well.  She is out of physical therapy.  She works from home.  She is doing a home exercise program.  She was able to clean the garage this weekend.  He takes occasional over-the-counter medication for pain.  On exam she has full extension and flexion to about 110.  No effusion.  Plan at this time is to continue with home exercise program.  Need for antibiotic prophylaxis before procedures discussed at least for the next 2 years.  I will see her back as needed  Follow-Up Instructions: Return if symptoms worsen or fail to improve.   Orders:  No orders of the defined types were placed in this encounter.  No orders of the defined types were placed in this encounter.   Imaging: No results found.  PMFS History: Patient Active Problem List   Diagnosis Date Noted  . Arthritis of knee 04/07/2018  . Unilateral primary osteoarthritis, left knee   . Prolapse of female bladder, acquired 05/08/2015  . Right elbow pain 07/25/2014  . Hyperlipidemia 04/28/2014  . Allergic rhinitis 04/28/2014  . Breast cancer, right. T2, N1. Mastectomy 01/30/2012. 01/09/2012   Past Medical History:  Diagnosis Date  . Arthritis   . Breast cancer [facility]) right breast dx 12/ 2000;  recurrence right breast 01/30/12   oncologist-  dr Marin Olp--  Stage IIb, grade 3, (T2n1M0) ductal carcinoma right breast-ER negative/ HER-2 positive---  s/p  right modified mastectomy with node dissection (3 positive) and chemotherapy (ended aug 2013) and Herceptin ended Aug 2014  . Cystocele   . GERD (gastroesophageal reflux  disease)    occasionally, takes Zantac OTC when needed  . History of DVT (deep vein thrombosis)    left subclavian vein -- port of PAC--  removed 06-23-2012  . Hypertension   . LBBB (left bundle branch block)   . Lymphedema of upper extremity    right arm  . Postmenopausal osteoporosis   . Uterine prolapse   . Wears partial dentures    upper    Family History  Problem Relation Age of Onset  . Diabetes Mother   . Hypertension Mother   . Kidney failure Mother   . Kidney disease Mother   . Cancer Mother        pancreatic  . Other Brother        heart transplant  . Cancer Maternal Uncle        lung    Past Surgical History:  Procedure Laterality Date  . BREAST LUMPECTOMY Right Jan 2001  . COLONOSCOPY  last one -- June 2016   WNL  . CYSTOCELE REPAIR N/A 05/08/2015   Procedure: ANTERIOR REPAIR (CYSTOCELE);  Surgeon: Molli Posey, MD;  Location: [facility];  Service: Gynecology;  Laterality: N/A;  . LAPAROSCOPIC ASSISTED VAGINAL HYSTERECTOMY N/A 05/08/2015   Procedure: LAPAROSCOPIC ASSISTED VAGINAL HYSTERECTOMY;  Surgeon: Molli Posey, MD;  Location: [place];  Service: Gynecology;  Laterality: N/A;  .  LAPAROSCOPIC BILATERAL SALPINGO OOPHERECTOMY Bilateral 05/08/2015   Procedure: LAPAROSCOPIC BILATERAL SALPINGO OOPHORECTOMY;  Surgeon: Molli Posey, MD;  Location: [place];  Service: Gynecology;  Laterality: Bilateral;  . MODIFIED RADICAL MASTECTOMY W/ AXILLARY LYMPH NODE DISSECTION Right 01/30/12  . PORT-A-CATH REMOVAL  06/23/2012   Procedure: MINOR REMOVAL PORT-A-CATH;  Surgeon: Shann Medal, MD;  Location: [place];  Service: General;  Laterality: Left;  . PORTACATH PLACEMENT  01/30/2012   Procedure: INSERTION PORT-A-CATH;  Surgeon: Shann Medal, MD;  Location: [place];  Service: General;  Laterality: Left;  . TOTAL KNEE ARTHROPLASTY Left 04/07/2018  . TOTAL KNEE ARTHROPLASTY Left 04/07/2018   Procedure:  LEFT TOTAL KNEE ARTHROPLASTY;  Surgeon: Meredith Pel, MD;  Location: [place];  Service: Orthopedics;  Laterality: Left;  . TRANSTHORACIC ECHOCARDIOGRAM  08-24-2012   grade I diastolic dysfunction,  ef 45-50%/  trivial MR  . TUBAL LIGATION  1980   Social History   Occupational History  . Not on file  Tobacco Use  . Smoking status: Former Smoker    Packs/day: 0.25    Years: 2.00    Pack years: 0.50    Types: Cigarettes    Start date: 03/31/2010    Last attempt to quit: 11/11/2011    Years since quitting: 6.6  . Smokeless tobacco: Never Used  . Tobacco comment: social smoker  Substance and Sexual Activity  . Alcohol use: Yes    Alcohol/week: 0.0 standard drinks    Comment: occasional  . Drug use: No  . Sexual activity: Not on file

## 2018-07-08 ENCOUNTER — Inpatient Hospital Stay: Payer: Federal, State, Local not specified - PPO | Attending: Hematology & Oncology | Admitting: Hematology & Oncology

## 2018-07-08 ENCOUNTER — Inpatient Hospital Stay: Payer: Federal, State, Local not specified - PPO

## 2018-07-08 ENCOUNTER — Other Ambulatory Visit: Payer: Self-pay

## 2018-07-08 ENCOUNTER — Encounter: Payer: Self-pay | Admitting: Hematology & Oncology

## 2018-07-08 VITALS — BP 134/98 | HR 86 | Temp 98.2°F | Resp 17 | Wt 172.4 lb

## 2018-07-08 DIAGNOSIS — Z9221 Personal history of antineoplastic chemotherapy: Secondary | ICD-10-CM | POA: Diagnosis not present

## 2018-07-08 DIAGNOSIS — C50011 Malignant neoplasm of nipple and areola, right female breast: Secondary | ICD-10-CM

## 2018-07-08 DIAGNOSIS — Z86000 Personal history of in-situ neoplasm of breast: Secondary | ICD-10-CM | POA: Diagnosis not present

## 2018-07-08 DIAGNOSIS — Z7982 Long term (current) use of aspirin: Secondary | ICD-10-CM | POA: Diagnosis not present

## 2018-07-08 DIAGNOSIS — M1710 Unilateral primary osteoarthritis, unspecified knee: Secondary | ICD-10-CM

## 2018-07-08 DIAGNOSIS — Z86718 Personal history of other venous thrombosis and embolism: Secondary | ICD-10-CM | POA: Diagnosis not present

## 2018-07-08 LAB — CMP (CANCER CENTER ONLY)
ALK PHOS: 89 U/L (ref 38–126)
ALT: 11 U/L (ref 0–44)
AST: 18 U/L (ref 15–41)
Albumin: 4 g/dL (ref 3.5–5.0)
Anion gap: 12 (ref 5–15)
BUN: 18 mg/dL (ref 8–23)
CALCIUM: 11.5 mg/dL — AB (ref 8.9–10.3)
CHLORIDE: 105 mmol/L (ref 98–111)
CO2: 26 mmol/L (ref 22–32)
CREATININE: 0.86 mg/dL (ref 0.44–1.00)
GFR, Est AFR Am: >60 mL/min (ref >60)
GFR, Estimated: >60 mL/min (ref >60)
Glucose, Bld: 100 mg/dL — ABNORMAL HIGH (ref 70–99)
Potassium: 4.9 mmol/L (ref 3.5–5.1)
SODIUM: 143 mmol/L (ref 135–145)
Total Bilirubin: 0.5 mg/dL (ref 0.3–1.2)
Total Protein: 8.3 g/dL — ABNORMAL HIGH (ref 6.5–8.1)

## 2018-07-08 LAB — CBC WITH DIFFERENTIAL (CANCER CENTER ONLY)
BASOS ABS: 0 10*3/uL (ref 0.0–0.1)
Basophils Relative: 0 %
EOS PCT: 1 %
Eosinophils Absolute: 0.1 10*3/uL (ref 0.0–0.5)
HCT: 40.1 % (ref 34.8–46.6)
HEMOGLOBIN: 12.7 g/dL (ref 11.6–15.9)
Lymphocytes Relative: 21 %
Lymphs Abs: 1.5 10*3/uL (ref 0.9–3.3)
MCH: 29.7 pg (ref 26.0–34.0)
MCHC: 31.7 g/dL — ABNORMAL LOW (ref 32.0–36.0)
MCV: 93.9 fL (ref 81.0–101.0)
Monocytes Absolute: 0.6 10*3/uL (ref 0.1–0.9)
Monocytes Relative: 8 %
NEUTROS ABS: 5 10*3/uL (ref 1.5–6.5)
NEUTROS PCT: 70 %
PLATELETS: 284 10*3/uL (ref 145–400)
RBC: 4.27 MIL/uL (ref 3.70–5.32)
RDW: 14.3 % (ref 11.1–15.7)
WBC: 7.1 10*3/uL (ref 3.9–10.0)

## 2018-07-08 NOTE — Progress Notes (Signed)
Hematology and Oncology Follow Up Visit  Maureen Duncan [id number] 06/02/1951 67 y.o. 07/08/2018   Principle Diagnosis:  Stage IIb (T2N1M0) ductal carcinoma of the right breast-ER negative/HER-2 positive History of DVT of the left subclavian vein  Current Therapy:    Aspirin 162 mg by mouth daily     Interim History:  Ms.  Duncan is back for followup.  She had her knee surgery back in June.  This was for her left knee.  She did well.  She is still recovering.  She is doing some physical therapy.  Otherwise, she is doing pretty well.  She has had no problems with cough or shortness of breath.  She is had no nausea or vomiting.  She is had no fever.  She is in charge of her 76 year old grandson.  He is doing fairly well.  He is doing good in school this makes her quite happy.  She is taking her vitamin D.  She is due for mammogram in December.  Overall, her performance status is ECOG 0.     Medications:  Current Outpatient Medications:  .  acetaminophen (TYLENOL) 500 MG tablet, Take 1,000 mg by mouth every 8 (eight) hours as needed for mild pain or fever. , Disp: , Rfl:  .  amLODipine-benazepril (LOTREL) 5-20 MG capsule, Take 1 capsule by mouth daily., Disp: 30 capsule, Rfl: 4 .  ASPERCREME LIDOCAINE EX, Apply 1 application topically 2 (two) times daily as needed (Knee pain)., Disp: , Rfl:  .  atorvastatin (LIPITOR) 40 MG tablet, Take 40 mg by mouth daily., Disp: , Rfl:  .  Calcium Carb-Cholecalciferol (CALCIUM 600 + D PO), Take 1 tablet by mouth daily., Disp: , Rfl:  .  diphenhydrAMINE (BENADRYL) 25 MG tablet, Take 50 mg by mouth daily as needed for itching or allergies. , Disp: , Rfl:  .  diphenhydramine-acetaminophen (TYLENOL PM) 25-500 MG TABS, Take 1 tablet by mouth at bedtime as needed (sleep). , Disp: , Rfl:  .  docusate sodium (COLACE) 100 MG capsule, Take 1 capsule (100 mg total) by mouth 2 (two) times daily., Disp: 10 capsule, Rfl: 0 .  fluticasone (FLONASE) 50 MCG/ACT  nasal spray, PLACE 2 SPRAYS INTO BOTH NOSTRILS AS NEEDED., Disp: 16 g, Rfl: 3 .  Melatonin 3 MG TABS, Take 2 tablets by mouth at bedtime as needed (sleep). , Disp: , Rfl:  .  methocarbamol (ROBAXIN) 500 MG tablet, Take 1 tablet (500 mg total) by mouth every 6 (six) hours as needed for muscle spasms., Disp: 30 tablet, Rfl: 0 .  oxyCODONE (OXY IR/ROXICODONE) 5 MG immediate release tablet, 1 po q 6-8hrs prn pain, Disp: 50 tablet, Rfl: 0 .  prednisoLONE acetate (PRED FORTE) 1 % ophthalmic suspension, Place 1 drop into both eyes 2 (two) times daily as needed (eye infection). , Disp: , Rfl:  .  ranitidine (ZANTAC) 150 MG tablet, Take 150 mg by mouth daily as needed for heartburn., Disp: , Rfl:  .  traMADol (ULTRAM) 50 MG tablet, Take 1 tablet (50 mg total) by mouth every 8 (eight) hours as needed., Disp: 30 tablet, Rfl: 0 .  Vitamin D, Ergocalciferol, (DRISDOL) 50000 units CAPS capsule, Take 1 capsule (50,000 Units total) by mouth every 7 (seven) days. (Patient taking differently: Take 50,000 Units by mouth 2 (two) times a week. ), Disp: 20 capsule, Rfl: 3 .  XARELTO 10 MG TABS tablet, TAKE 1 TABLET BY MOUTH EVERYDAY AT BEDTIME, Disp: 21 tablet, Rfl: 0 No current facility-administered medications for  this visit.   Facility-Administered Medications Ordered in Other Visits:  .  fentaNYL (SUBLIMAZE) injection, , , Anesthesia Intra-op, Jenne Campus, CRNA, 50 mcg at 02/24/18 7121 .  lactated ringers infusion, , , Continuous PRN, Rock, Oletta Darter, CRNA .  midazolam (VERSED) 5 MG/5ML injection, , , Anesthesia Intra-op, Jenne Campus, CRNA, 2 mg at 02/24/18 0703 .  sodium chloride 0.9 % injection 10 mL, 10 mL, Intravenous, PRN, Volanda Napoleon, MD, 10 mL at 06/17/12 1651  Allergies:  Allergies  Allergen Reactions  . Adhesive [Tape] Rash    Past Medical History, Surgical history, Social history, and Family History were reviewed and updated.  Review of Systems: Review of Systems  Constitutional:  Negative.   HENT: Negative.   Eyes: Negative.   Respiratory: Negative.   Cardiovascular: Negative.   Gastrointestinal: Negative.   Genitourinary: Negative.   Musculoskeletal: Negative.   Skin: Negative.   Neurological: Negative.   Endo/Heme/Allergies: Negative.   Psychiatric/Behavioral: Negative.     Physical Exam:  weight is 172 lb 6.4 oz (78.2 kg). Her oral temperature is 98.2 F (36.8 C). Her blood pressure is 134/98 (abnormal) and her pulse is 86. Her respiration is 17 and oxygen saturation is 100%.   Physical Exam  Constitutional: She is oriented to person, place, and time.  HENT:  Head: Normocephalic and atraumatic.  Mouth/Throat: Oropharynx is clear and moist.  Eyes: Pupils are equal, round, and reactive to light. EOM are normal.  Neck: Normal range of motion.  Cardiovascular: Normal rate, regular rhythm and normal heart sounds.  Pulmonary/Chest: Effort normal and breath sounds normal.  Abdominal: Soft. Bowel sounds are normal.  Musculoskeletal: Normal range of motion. She exhibits no edema, tenderness or deformity.  Lymphadenopathy:    She has no cervical adenopathy.  Neurological: She is alert and oriented to person, place, and time.  Skin: Skin is warm and dry. No rash noted. No erythema.  Psychiatric: She has a normal mood and affect. Her behavior is normal. Judgment and thought content normal.  Vitals reviewed.    Lab Results  Component Value Date   WBC 7.1 07/08/2018   HGB 12.7 07/08/2018   HCT 40.1 07/08/2018   MCV 93.9 07/08/2018   PLT 284 07/08/2018     Chemistry      Component Value Date/Time   NA 139 03/27/2018 1508   NA 141 07/28/2017 0911   NA 142 01/27/2017 0849   K 3.6 03/27/2018 1508   K 3.8 07/28/2017 0911   K 4.3 01/27/2017 0849   CL 105 03/27/2018 1508   CL 105 07/28/2017 0911   CO2 24 03/27/2018 1508   CO2 29 07/28/2017 0911   CO2 29 01/27/2017 0849   BUN 13 03/27/2018 1508   BUN 13 07/28/2017 0911   BUN 16.9 01/27/2017 0849    CREATININE 0.93 03/27/2018 1508   CREATININE 0.80 01/05/2018 1053   CREATININE 0.8 07/28/2017 0911   CREATININE 0.8 01/27/2017 0849      Component Value Date/Time   CALCIUM 10.0 03/27/2018 1508   CALCIUM 10.0 07/28/2017 0911   CALCIUM 10.5 (H) 01/27/2017 0849   ALKPHOS 67 01/05/2018 1053   ALKPHOS 64 07/28/2017 0911   ALKPHOS 67 01/27/2017 0849   AST 25 01/05/2018 1053   AST 20 01/27/2017 0849   ALT 20 01/05/2018 1053   ALT 19 07/28/2017 0911   ALT 16 01/27/2017 0849   BILITOT 0.7 01/05/2018 1053   BILITOT 0.53 01/27/2017 0849  Impression and Plan: Ms. Eggleton is 67 year old African female. She has a history of stage IIb ductal carcinoma of the right breast. Her tumor was HER-2 positive. She was treated with systemic chemotherapy followed by a year of Herceptin. She completed this in August of 2014.  Of note, she had 3 positive lymph nodes. She did receive 6 cycles of chemotherapy with Taxotere and carboplatinum.  I will plan to see her back in 6 months.  I am sure that by then, her knee will be doing much better.  Volanda Napoleon, MD 9/11/201910:34 AM

## 2018-08-12 ENCOUNTER — Telehealth (INDEPENDENT_AMBULATORY_CARE_PROVIDER_SITE_OTHER): Payer: Self-pay | Admitting: Orthopedic Surgery

## 2018-08-12 MED ORDER — AMOXICILLIN 500 MG PO TABS: ORAL_TABLET | ORAL | 0 refills | Status: DC

## 2018-08-12 NOTE — Telephone Encounter (Signed)
Sent to pharmacy. Patient advised done.

## 2018-08-12 NOTE — Telephone Encounter (Signed)
Patient called stating that she has a dental appointment on September 17, 2018 and needs to get an antibiotic before her appointment.  CB#[phone].  Thank you

## 2018-08-13 ENCOUNTER — Telehealth (INDEPENDENT_AMBULATORY_CARE_PROVIDER_SITE_OTHER): Payer: Self-pay | Admitting: Orthopedic Surgery

## 2018-08-13 NOTE — Telephone Encounter (Signed)
Patient called asked if a treadmill is ok to use for exercise. Patient said she want to walk on it. The number to contact patient is [phone]

## 2018-08-14 NOTE — Telephone Encounter (Signed)
IC advised ok.  

## 2018-08-14 NOTE — Telephone Encounter (Signed)
Treadmill okay.

## 2018-08-14 NOTE — Telephone Encounter (Signed)
Please advise. Thanks.  

## 2018-08-27 ENCOUNTER — Other Ambulatory Visit: Payer: Self-pay | Admitting: Hematology & Oncology

## 2018-09-15 ENCOUNTER — Telehealth (INDEPENDENT_AMBULATORY_CARE_PROVIDER_SITE_OTHER): Payer: Self-pay | Admitting: Orthopedic Surgery

## 2018-09-15 NOTE — Telephone Encounter (Signed)
Pt called requesting to Dr. Forbes Cellar nurse  Pts # [phone]

## 2018-09-15 NOTE — Telephone Encounter (Signed)
See below this is a Scientist, physiological pt

## 2018-09-16 NOTE — Telephone Encounter (Signed)
IC patient. She fell on surgical knee-worked her in on Friday

## 2018-09-18 ENCOUNTER — Ambulatory Visit (INDEPENDENT_AMBULATORY_CARE_PROVIDER_SITE_OTHER): Payer: Federal, State, Local not specified - PPO

## 2018-09-18 ENCOUNTER — Ambulatory Visit (INDEPENDENT_AMBULATORY_CARE_PROVIDER_SITE_OTHER): Payer: Federal, State, Local not specified - PPO | Admitting: Orthopedic Surgery

## 2018-09-18 ENCOUNTER — Encounter (INDEPENDENT_AMBULATORY_CARE_PROVIDER_SITE_OTHER): Payer: Self-pay | Admitting: Orthopedic Surgery

## 2018-09-18 DIAGNOSIS — Z96652 Presence of left artificial knee joint: Secondary | ICD-10-CM | POA: Diagnosis not present

## 2018-09-18 NOTE — Progress Notes (Signed)
Office Visit Note   Patient: Maureen Duncan           Date of Birth: 06-30-1951           MRN: [id number] Visit Date: 09/18/2018 Requested by: Deland Pretty, MD [address] [place], [place] [address] PCP: Deland Pretty, MD  Subjective: Chief Complaint  Patient presents with  . Left Knee - Pain    HPI: Maureen Duncan is a patient who underwent left total knee replacement in June of this year.  She actually had a hyperflexion injury going down the stairs 09/13/2018.  Had some swelling in the knee but that has since improved.  She has been able to walk.              ROS: All systems reviewed are negative as they relate to the chief complaint within the history of present illness.  Patient denies  fevers or chills.   Assessment & Plan: Visit Diagnoses:  1. Status post total left knee replacement     Plan: Impression is hyperflexion injury left knee but with stable collateral ligaments extensor mechanism and PCL.  Plan is activity as tolerated.  No effusion today and radiographs are normal.  I think she may have broken up some scar tissue and had a little swelling in the knee about 10 days ago but that swelling has diminished.  Follow-Up Instructions: Return if symptoms worsen or fail to improve.   Orders:  Orders Placed This Encounter  Procedures  . XR Knee 1-2 Views Left   No orders of the defined types were placed in this encounter.     Procedures: No procedures performed   Clinical Data: No additional findings.  Objective: Vital Signs: There were no vitals taken for this visit.  Physical Exam:   Constitutional: Patient appears well-developed HEENT:  Head: Normocephalic Eyes:EOM are normal Neck: Normal range of motion Cardiovascular: Normal rate Pulmonary/chest: Effort normal Neurologic: Patient is alert Skin: Skin is warm Psychiatric: Patient has normal mood and affect    Ortho Exam: Ortho exam demonstrates full active and passive range of  motion of the right knee.  Left knee has full extension and flexion to about 115.  Collaterals are stable.  There is no effusion.  No other masses lymph adenopathy or skin change noted in that left knee region  Specialty Comments:  No specialty comments available.  Imaging: Xr Knee 1-2 Views Left  Result Date: 09/18/2018 AP lateral left knee reviewed.  Press-fit total knee prosthesis in good position and alignment with no complicating features.  Patella height is normal.    PMFS History: Patient Active Problem List   Diagnosis Date Noted  . Arthritis of knee 04/07/2018  . Unilateral primary osteoarthritis, left knee   . Prolapse of female bladder, acquired 05/08/2015  . Right elbow pain 07/25/2014  . Hyperlipidemia 04/28/2014  . Allergic rhinitis 04/28/2014  . Breast cancer, right. T2, N1. Mastectomy 01/30/2012. 01/09/2012   Past Medical History:  Diagnosis Date  . Arthritis   . Breast cancer [facility]) right breast dx 12/ 2000;  recurrence right breast 01/30/12   oncologist-  dr Marin Olp--  Stage IIb, grade 3, (T2n1M0) ductal carcinoma right breast-ER negative/ HER-2 positive---  s/p  right modified mastectomy with node dissection (3 positive) and chemotherapy (ended aug 2013) and Herceptin ended Aug 2014  . Cystocele   . GERD (gastroesophageal reflux disease)    occasionally, takes Zantac OTC when needed  . History of DVT (deep vein thrombosis)  left subclavian vein -- port of PAC--  removed 06-23-2012  . Hypertension   . LBBB (left bundle branch block)   . Lymphedema of upper extremity    right arm  . Postmenopausal osteoporosis   . Uterine prolapse   . Wears partial dentures    upper    Family History  Problem Relation Age of Onset  . Diabetes Mother   . Hypertension Mother   . Kidney failure Mother   . Kidney disease Mother   . Cancer Mother        pancreatic  . Other Brother        heart transplant  . Cancer Maternal Uncle        lung    Past Surgical History:    Procedure Laterality Date  . BREAST LUMPECTOMY Right Jan 2001  . COLONOSCOPY  last one -- June 2016   WNL  . CYSTOCELE REPAIR N/A 05/08/2015   Procedure: ANTERIOR REPAIR (CYSTOCELE);  Surgeon: Molli Posey, MD;  Location: [facility];  Service: Gynecology;  Laterality: N/A;  . LAPAROSCOPIC ASSISTED VAGINAL HYSTERECTOMY N/A 05/08/2015   Procedure: LAPAROSCOPIC ASSISTED VAGINAL HYSTERECTOMY;  Surgeon: Molli Posey, MD;  Location: [place];  Service: Gynecology;  Laterality: N/A;  . LAPAROSCOPIC BILATERAL SALPINGO OOPHERECTOMY Bilateral 05/08/2015   Procedure: LAPAROSCOPIC BILATERAL SALPINGO OOPHORECTOMY;  Surgeon: Molli Posey, MD;  Location: [place];  Service: Gynecology;  Laterality: Bilateral;  . MODIFIED RADICAL MASTECTOMY W/ AXILLARY LYMPH NODE DISSECTION Right 01/30/12  . PORT-A-CATH REMOVAL  06/23/2012   Procedure: MINOR REMOVAL PORT-A-CATH;  Surgeon: Shann Medal, MD;  Location: [place];  Service: General;  Laterality: Left;  . PORTACATH PLACEMENT  01/30/2012   Procedure: INSERTION PORT-A-CATH;  Surgeon: Shann Medal, MD;  Location: [place];  Service: General;  Laterality: Left;  . TOTAL KNEE ARTHROPLASTY Left 04/07/2018  . TOTAL KNEE ARTHROPLASTY Left 04/07/2018   Procedure: LEFT TOTAL KNEE ARTHROPLASTY;  Surgeon: Meredith Pel, MD;  Location: [place];  Service: Orthopedics;  Laterality: Left;  . TRANSTHORACIC ECHOCARDIOGRAM  08-24-2012   grade I diastolic dysfunction,  ef 45-50%/  trivial MR  . TUBAL LIGATION  1980   Social History   Occupational History  . Not on file  Tobacco Use  . Smoking status: Former Smoker    Packs/day: 0.25    Years: 2.00    Pack years: 0.50    Types: Cigarettes    Start date: 03/31/2010    Last attempt to quit: 11/11/2011    Years since quitting: 6.8  . Smokeless tobacco: Never Used  . Tobacco comment: social smoker  Substance and Sexual Activity  . Alcohol use: Yes     Alcohol/week: 0.0 standard drinks    Comment: occasional  . Drug use: No  . Sexual activity: Not on file

## 2018-10-26 DIAGNOSIS — Z1231 Encounter for screening mammogram for malignant neoplasm of breast: Secondary | ICD-10-CM | POA: Diagnosis not present

## 2018-10-26 DIAGNOSIS — Z853 Personal history of malignant neoplasm of breast: Secondary | ICD-10-CM | POA: Diagnosis not present

## 2018-12-07 DIAGNOSIS — Z0000 Encounter for general adult medical examination without abnormal findings: Secondary | ICD-10-CM | POA: Diagnosis not present

## 2018-12-07 DIAGNOSIS — E7800 Pure hypercholesterolemia, unspecified: Secondary | ICD-10-CM | POA: Diagnosis not present

## 2019-01-06 ENCOUNTER — Inpatient Hospital Stay: Payer: Federal, State, Local not specified - PPO | Attending: Hematology & Oncology | Admitting: Hematology & Oncology

## 2019-01-06 ENCOUNTER — Inpatient Hospital Stay: Payer: Federal, State, Local not specified - PPO

## 2019-01-06 ENCOUNTER — Other Ambulatory Visit: Payer: Self-pay

## 2019-01-06 ENCOUNTER — Encounter: Payer: Self-pay | Admitting: Hematology & Oncology

## 2019-01-06 VITALS — BP 121/92 | HR 76 | Temp 97.9°F | Resp 20 | Wt 175.1 lb

## 2019-01-06 DIAGNOSIS — Z853 Personal history of malignant neoplasm of breast: Secondary | ICD-10-CM | POA: Diagnosis not present

## 2019-01-06 DIAGNOSIS — Z7901 Long term (current) use of anticoagulants: Secondary | ICD-10-CM

## 2019-01-06 DIAGNOSIS — Z79899 Other long term (current) drug therapy: Secondary | ICD-10-CM | POA: Diagnosis not present

## 2019-01-06 DIAGNOSIS — Z9221 Personal history of antineoplastic chemotherapy: Secondary | ICD-10-CM | POA: Insufficient documentation

## 2019-01-06 DIAGNOSIS — M1710 Unilateral primary osteoarthritis, unspecified knee: Secondary | ICD-10-CM

## 2019-01-06 DIAGNOSIS — C50011 Malignant neoplasm of nipple and areola, right female breast: Secondary | ICD-10-CM

## 2019-01-06 DIAGNOSIS — Z86718 Personal history of other venous thrombosis and embolism: Secondary | ICD-10-CM

## 2019-01-06 DIAGNOSIS — Z171 Estrogen receptor negative status [ER-]: Secondary | ICD-10-CM | POA: Diagnosis not present

## 2019-01-06 LAB — CBC WITH DIFFERENTIAL (CANCER CENTER ONLY)
ABS IMMATURE GRANULOCYTES: 0.02 10*3/uL (ref 0.00–0.07)
BASOS PCT: 1 %
Basophils Absolute: 0.1 10*3/uL (ref 0.0–0.1)
Eosinophils Absolute: 0.1 10*3/uL (ref 0.0–0.5)
Eosinophils Relative: 1 %
HCT: 41.5 % (ref 36.0–46.0)
HEMOGLOBIN: 13.2 g/dL (ref 12.0–15.0)
IMMATURE GRANULOCYTES: 0 %
LYMPHS ABS: 1.7 10*3/uL (ref 0.7–4.0)
LYMPHS PCT: 29 %
MCH: 30.6 pg (ref 26.0–34.0)
MCHC: 31.8 g/dL (ref 30.0–36.0)
MCV: 96.1 fL (ref 80.0–100.0)
MONO ABS: 0.5 10*3/uL (ref 0.1–1.0)
MONOS PCT: 8 %
NEUTROS ABS: 3.5 10*3/uL (ref 1.7–7.7)
NEUTROS PCT: 61 %
PLATELETS: 281 10*3/uL (ref 150–400)
RBC: 4.32 MIL/uL (ref 3.87–5.11)
RDW: 12.9 % (ref 11.5–15.5)
WBC Count: 5.8 10*3/uL (ref 4.0–10.5)
nRBC: 0 % (ref 0.0–0.2)

## 2019-01-06 LAB — CMP (CANCER CENTER ONLY)
ALK PHOS: 61 U/L (ref 38–126)
ALT: 12 U/L (ref 0–44)
AST: 17 U/L (ref 15–41)
Albumin: 4.4 g/dL (ref 3.5–5.0)
Anion gap: 7 (ref 5–15)
BUN: 15 mg/dL (ref 8–23)
CO2: 29 mmol/L (ref 22–32)
CREATININE: 0.76 mg/dL (ref 0.44–1.00)
Calcium: 10.8 mg/dL — ABNORMAL HIGH (ref 8.9–10.3)
Chloride: 103 mmol/L (ref 98–111)
GFR, Est AFR Am: >60 mL/min (ref >60)
GFR, Estimated: >60 mL/min (ref >60)
GLUCOSE: 108 mg/dL — AB (ref 70–99)
POTASSIUM: 3.9 mmol/L (ref 3.5–5.1)
SODIUM: 139 mmol/L (ref 135–145)
TOTAL PROTEIN: 7.4 g/dL (ref 6.5–8.1)
Total Bilirubin: 0.6 mg/dL (ref 0.3–1.2)

## 2019-01-06 LAB — LACTATE DEHYDROGENASE: LDH: 191 U/L (ref 98–192)

## 2019-01-06 MED ORDER — FLUTICASONE PROPIONATE 50 MCG/ACT NA SUSP: 2.0000 | NASAL | 3 refills | Status: DC | PRN

## 2019-01-06 NOTE — Progress Notes (Signed)
Hematology and Oncology Follow Up Visit  Maureen Duncan [id number] [birth year] 68 y.o. 01/06/2019   Principle Diagnosis:  Stage IIb (T2N1M0) ductal carcinoma of the right breast-ER negative/HER-2 positive History of DVT of the left subclavian vein  Current Therapy:    Aspirin 162 mg by mouth daily     Interim History:  Ms.  Duncan is back for followup.  So far, everything is going quite well for her.  One issue is that her daughter see about moving to [place].  With this, her grandson goes with her daughter.  She really does not want to see her grandson leave.  She has recovered from her knee surgery.  She had this back in June 2019.  She has done very very nicely.  The first couple months were very tough on her but she got through it.  Her appetite is good.  She is had no nausea or vomiting.  She recently had a mammogram.  The mammogram did not show any evidence of disease in the left breast.  She is due for another mammogram in a year.  Overall, her performance status is ECOG 1.     Medications:  Current Outpatient Medications:    acetaminophen (TYLENOL) 500 MG tablet, Take 1,000 mg by mouth every 8 (eight) hours as needed for mild pain or fever. , Disp: , Rfl:    amLODipine-benazepril (LOTREL) 5-20 MG capsule, TAKE 1 CAPSULE BY MOUTH EVERY DAY, Disp: 30 capsule, Rfl: 4   ASPERCREME LIDOCAINE EX, Apply 1 application topically 2 (two) times daily as needed (Knee pain)., Disp: , Rfl:    atorvastatin (LIPITOR) 40 MG tablet, Take 40 mg by mouth daily., Disp: , Rfl:    Calcium-Vitamin D-Vitamin K (CALCIUM SOFT CHEWS PO), Take by mouth., Disp: , Rfl:    diphenhydrAMINE (BENADRYL) 25 MG tablet, Take 50 mg by mouth daily as needed for itching or allergies. , Disp: , Rfl:    diphenhydramine-acetaminophen (TYLENOL PM) 25-500 MG TABS, Take 1 tablet by mouth at bedtime as needed (sleep). , Disp: , Rfl:    docusate sodium (COLACE) 100 MG capsule, Take 1 capsule (100 mg total) by mouth 2  (two) times daily. (Patient taking differently: Take 100 mg by mouth daily as needed. ), Disp: 10 capsule, Rfl: 0   fluticasone (FLONASE) 50 MCG/ACT nasal spray, PLACE 2 SPRAYS INTO BOTH NOSTRILS AS NEEDED., Disp: 16 g, Rfl: 3   Vitamin D, Ergocalciferol, (DRISDOL) 50000 units CAPS capsule, Take 1 capsule (50,000 Units total) by mouth every 7 (seven) days. (Patient taking differently: Take 50,000 Units by mouth 2 (two) times a week. ), Disp: 20 capsule, Rfl: 3   amoxicillin (AMOXIL) 500 MG tablet, Take 2 grams 1 hour prior to dental procedure (Patient not taking: Reported on 01/06/2019), Disp: 10 tablet, Rfl: 0   Calcium Carb-Cholecalciferol (CALCIUM 600 + D PO), Take 1 tablet by mouth daily., Disp: , Rfl:    Melatonin 3 MG TABS, Take 2 tablets by mouth at bedtime as needed (sleep). , Disp: , Rfl:    methocarbamol (ROBAXIN) 500 MG tablet, Take 1 tablet (500 mg total) by mouth every 6 (six) hours as needed for muscle spasms., Disp: 30 tablet, Rfl: 0   oxyCODONE (OXY IR/ROXICODONE) 5 MG immediate release tablet, 1 po q 6-8hrs prn pain, Disp: 50 tablet, Rfl: 0   prednisoLONE acetate (PRED FORTE) 1 % ophthalmic suspension, Place 1 drop into both eyes 2 (two) times daily as needed (eye infection). , Disp: , Rfl:  ranitidine (ZANTAC) 150 MG tablet, Take 150 mg by mouth daily as needed for heartburn., Disp: , Rfl:    traMADol (ULTRAM) 50 MG tablet, Take 1 tablet (50 mg total) by mouth every 8 (eight) hours as needed., Disp: 30 tablet, Rfl: 0   XARELTO 10 MG TABS tablet, TAKE 1 TABLET BY MOUTH EVERYDAY AT BEDTIME, Disp: 21 tablet, Rfl: 0 No current facility-administered medications for this visit.   Facility-Administered Medications Ordered in Other Visits:    fentaNYL (SUBLIMAZE) injection, , , Anesthesia Intra-op, Jenne Campus, CRNA, 50 mcg at 02/24/18 8832   lactated ringers infusion, , , Continuous PRN, Joylene John, Oletta Darter, CRNA   midazolam (VERSED) 5 MG/5ML injection, , , Anesthesia  Intra-op, Jenne Campus, CRNA, 2 mg at 02/24/18 0703   sodium chloride 0.9 % injection 10 mL, 10 mL, Intravenous, PRN, Volanda Napoleon, MD, 10 mL at 06/17/12 1651  Allergies:  Allergies  Allergen Reactions   Adhesive [Tape] Rash    Past Medical History, Surgical history, Social history, and Family History were reviewed and updated.  Review of Systems: Review of Systems  Constitutional: Negative.   HENT: Negative.   Eyes: Negative.   Respiratory: Negative.   Cardiovascular: Negative.   Gastrointestinal: Negative.   Genitourinary: Negative.   Musculoskeletal: Negative.   Skin: Negative.   Neurological: Negative.   Endo/Heme/Allergies: Negative.   Psychiatric/Behavioral: Negative.     Physical Exam:  weight is 175 lb 1.9 oz (79.4 kg). Her oral temperature is 97.9 F (36.6 C). Her blood pressure is 121/92 (abnormal) and her pulse is 76. Her respiration is 20 and oxygen saturation is 99%.   Physical Exam Vitals signs reviewed.  Constitutional:      Comments: Breast exam shows left breast with no masses, edema or erythema.  There is no left nipple discharge.  There is no left axillary adenopathy.  Right chest wall shows well-healed mastectomy.  There is no right chest wall nodules.  There is no right chest wall erythema.  There is no right axillary adenopathy.  HENT:     Head: Normocephalic and atraumatic.  Eyes:     Pupils: Pupils are equal, round, and reactive to light.  Neck:     Musculoskeletal: Normal range of motion.  Cardiovascular:     Rate and Rhythm: Normal rate and regular rhythm.     Heart sounds: Normal heart sounds.  Pulmonary:     Effort: Pulmonary effort is normal.     Breath sounds: Normal breath sounds.  Abdominal:     General: Bowel sounds are normal.     Palpations: Abdomen is soft.  Musculoskeletal: Normal range of motion.        General: No tenderness or deformity.  Lymphadenopathy:     Cervical: No cervical adenopathy.  Skin:    General:  Skin is warm and dry.     Findings: No erythema or rash.  Neurological:     Mental Status: She is alert and oriented to person, place, and time.  Psychiatric:        Behavior: Behavior normal.        Thought Content: Thought content normal.        Judgment: Judgment normal.      Lab Results  Component Value Date   WBC 5.8 01/06/2019   HGB 13.2 01/06/2019   HCT 41.5 01/06/2019   MCV 96.1 01/06/2019   PLT 281 01/06/2019     Chemistry      Component Value Date/Time  NA 139 01/06/2019 1000   NA 141 07/28/2017 0911   NA 142 01/27/2017 0849   K 3.9 01/06/2019 1000   K 3.8 07/28/2017 0911   K 4.3 01/27/2017 0849   CL 103 01/06/2019 1000   CL 105 07/28/2017 0911   CO2 29 01/06/2019 1000   CO2 29 07/28/2017 0911   CO2 29 01/27/2017 0849   BUN 15 01/06/2019 1000   BUN 13 07/28/2017 0911   BUN 16.9 01/27/2017 0849   CREATININE 0.76 01/06/2019 1000   CREATININE 0.8 07/28/2017 0911   CREATININE 0.8 01/27/2017 0849      Component Value Date/Time   CALCIUM 10.8 (H) 01/06/2019 1000   CALCIUM 10.0 07/28/2017 0911   CALCIUM 10.5 (H) 01/27/2017 0849   ALKPHOS 61 01/06/2019 1000   ALKPHOS 64 07/28/2017 0911   ALKPHOS 67 01/27/2017 0849   AST 17 01/06/2019 1000   AST 20 01/27/2017 0849   ALT 12 01/06/2019 1000   ALT 19 07/28/2017 0911   ALT 16 01/27/2017 0849   BILITOT 0.6 01/06/2019 1000   BILITOT 0.53 01/27/2017 0849         Impression and Plan: Ms. Salyers is 68 year old African female. She has a history of stage IIb ductal carcinoma of the right breast. Her tumor was HER-2 positive. She was treated with systemic chemotherapy followed by a year of Herceptin. She completed this in August of 2014.  Of note, she had 3 positive lymph nodes. She did receive 6 cycles of chemotherapy with Taxotere and carboplatinum.  I will plan to see her back in 6 months.     Volanda Napoleon, MD 3/11/202010:57 AM

## 2019-01-07 ENCOUNTER — Telehealth: Payer: Self-pay | Admitting: *Deleted

## 2019-01-07 LAB — VITAMIN D 25 HYDROXY (VIT D DEFICIENCY, FRACTURES): VIT D 25 HYDROXY: 29.5 ng/mL — AB (ref 30.0–100.0)

## 2019-01-07 NOTE — Telephone Encounter (Addendum)
Patient is aware of results and recommendations  ----- Message from Volanda Napoleon, MD sent at 01/07/2019  6:51 AM EDT ----- Call - the Vit D is still low!!!  Need to take Vit D 50000 2x a week. Maureen Duncan

## 2019-01-13 ENCOUNTER — Other Ambulatory Visit: Payer: Self-pay | Admitting: Hematology & Oncology

## 2019-01-13 DIAGNOSIS — E559 Vitamin D deficiency, unspecified: Secondary | ICD-10-CM

## 2019-02-23 ENCOUNTER — Ambulatory Visit: Payer: Self-pay | Admitting: Cardiology

## 2019-02-28 DIAGNOSIS — M25532 Pain in left wrist: Secondary | ICD-10-CM | POA: Diagnosis not present

## 2019-04-21 DIAGNOSIS — M25531 Pain in right wrist: Secondary | ICD-10-CM | POA: Diagnosis not present

## 2019-04-21 DIAGNOSIS — M129 Arthropathy, unspecified: Secondary | ICD-10-CM | POA: Diagnosis not present

## 2019-05-04 DIAGNOSIS — Z6828 Body mass index (BMI) 28.0-28.9, adult: Secondary | ICD-10-CM | POA: Diagnosis not present

## 2019-05-04 DIAGNOSIS — Z01419 Encounter for gynecological examination (general) (routine) without abnormal findings: Secondary | ICD-10-CM | POA: Diagnosis not present

## 2019-06-22 DIAGNOSIS — M542 Cervicalgia: Secondary | ICD-10-CM | POA: Diagnosis not present

## 2019-06-22 DIAGNOSIS — M7918 Myalgia, other site: Secondary | ICD-10-CM | POA: Diagnosis not present

## 2019-07-09 ENCOUNTER — Telehealth: Payer: Self-pay | Admitting: Hematology & Oncology

## 2019-07-09 ENCOUNTER — Inpatient Hospital Stay: Payer: Federal, State, Local not specified - PPO | Attending: Hematology & Oncology

## 2019-07-09 ENCOUNTER — Other Ambulatory Visit: Payer: Self-pay

## 2019-07-09 ENCOUNTER — Encounter: Payer: Self-pay | Admitting: Hematology & Oncology

## 2019-07-09 ENCOUNTER — Inpatient Hospital Stay (HOSPITAL_BASED_OUTPATIENT_CLINIC_OR_DEPARTMENT_OTHER): Payer: Federal, State, Local not specified - PPO | Admitting: Hematology & Oncology

## 2019-07-09 DIAGNOSIS — C50011 Malignant neoplasm of nipple and areola, right female breast: Secondary | ICD-10-CM

## 2019-07-09 DIAGNOSIS — Z171 Estrogen receptor negative status [ER-]: Secondary | ICD-10-CM | POA: Insufficient documentation

## 2019-07-09 DIAGNOSIS — Z79899 Other long term (current) drug therapy: Secondary | ICD-10-CM | POA: Diagnosis not present

## 2019-07-09 DIAGNOSIS — Z86718 Personal history of other venous thrombosis and embolism: Secondary | ICD-10-CM | POA: Diagnosis not present

## 2019-07-09 DIAGNOSIS — Z853 Personal history of malignant neoplasm of breast: Secondary | ICD-10-CM | POA: Diagnosis not present

## 2019-07-09 DIAGNOSIS — Z9221 Personal history of antineoplastic chemotherapy: Secondary | ICD-10-CM | POA: Diagnosis not present

## 2019-07-09 DIAGNOSIS — Z7901 Long term (current) use of anticoagulants: Secondary | ICD-10-CM | POA: Insufficient documentation

## 2019-07-09 LAB — CMP (CANCER CENTER ONLY)
ALT: 14 U/L (ref 0–44)
AST: 17 U/L (ref 15–41)
Albumin: 4.1 g/dL (ref 3.5–5.0)
Alkaline Phosphatase: 62 U/L (ref 38–126)
Anion gap: 7 (ref 5–15)
BUN: 16 mg/dL (ref 8–23)
CO2: 28 mmol/L (ref 22–32)
Calcium: 9.9 mg/dL (ref 8.9–10.3)
Chloride: 103 mmol/L (ref 98–111)
Creatinine: 0.76 mg/dL (ref 0.44–1.00)
GFR, Est AFR Am: >60 mL/min (ref >60)
GFR, Estimated: >60 mL/min (ref >60)
Glucose, Bld: 90 mg/dL (ref 70–99)
Potassium: 3.6 mmol/L (ref 3.5–5.1)
Sodium: 138 mmol/L (ref 135–145)
Total Bilirubin: 0.5 mg/dL (ref 0.3–1.2)
Total Protein: 7 g/dL (ref 6.5–8.1)

## 2019-07-09 LAB — CBC WITH DIFFERENTIAL (CANCER CENTER ONLY)
Abs Immature Granulocytes: 0.04 10*3/uL (ref 0.00–0.07)
Basophils Absolute: 0 10*3/uL (ref 0.0–0.1)
Basophils Relative: 1 %
Eosinophils Absolute: 0.1 10*3/uL (ref 0.0–0.5)
Eosinophils Relative: 1 %
HCT: 40.5 % (ref 36.0–46.0)
Hemoglobin: 12.9 g/dL (ref 12.0–15.0)
Immature Granulocytes: 1 %
Lymphocytes Relative: 23 %
Lymphs Abs: 1.7 10*3/uL (ref 0.7–4.0)
MCH: 31 pg (ref 26.0–34.0)
MCHC: 31.9 g/dL (ref 30.0–36.0)
MCV: 97.4 fL (ref 80.0–100.0)
Monocytes Absolute: 0.5 10*3/uL (ref 0.1–1.0)
Monocytes Relative: 7 %
Neutro Abs: 4.7 10*3/uL (ref 1.7–7.7)
Neutrophils Relative %: 67 %
Platelet Count: 251 10*3/uL (ref 150–400)
RBC: 4.16 MIL/uL (ref 3.87–5.11)
RDW: 13.1 % (ref 11.5–15.5)
WBC Count: 7.1 10*3/uL (ref 4.0–10.5)
nRBC: 0 % (ref 0.0–0.2)

## 2019-07-09 NOTE — Telephone Encounter (Signed)
lmom to inform patient of March 2021 appts per 9/11 LOS

## 2019-07-09 NOTE — Progress Notes (Signed)
Hematology and Oncology Follow Up Visit  Maureen Duncan [id number] 1951-06-21 68 y.o. 07/09/2019   Principle Diagnosis:  Stage IIb (T2N1M0) ductal carcinoma of the right breast-ER negative/HER-2 positive History of DVT of the left subclavian vein  Current Therapy:    Aspirin 162 mg by mouth daily     Interim History:  Ms.  Duncan is back for followup.  So far, everything is going quite well for her.  One issue is that her daughter might be moving to [place].  With this, her grandson goes with her daughter.  She really does not want to see her grandson leave.  She has recovered from her knee surgery.  She had this back in June 2019.  She has done very very nicely.  The first couple months were very tough on her but she got through it.  Her appetite is good.  She is had no nausea or vomiting.  She recently had a mammogram.  The mammogram did not show any evidence of disease in the left breast.  She is due for another mammogram in a year.  Overall, her performance status is ECOG 1.     Medications:  Current Outpatient Medications:  .  acetaminophen (TYLENOL) 500 MG tablet, Take 1,000 mg by mouth every 8 (eight) hours as needed for mild pain or fever. , Disp: , Rfl:  .  amLODipine-benazepril (LOTREL) 5-20 MG capsule, TAKE 1 CAPSULE BY MOUTH EVERY DAY, Disp: 30 capsule, Rfl: 4 .  amoxicillin (AMOXIL) 500 MG tablet, Take 2 grams 1 hour prior to dental procedure (Patient not taking: Reported on 01/06/2019), Disp: 10 tablet, Rfl: 0 .  ASPERCREME LIDOCAINE EX, Apply 1 application topically 2 (two) times daily as needed (Knee pain)., Disp: , Rfl:  .  atorvastatin (LIPITOR) 40 MG tablet, Take 40 mg by mouth daily., Disp: , Rfl:  .  Calcium-Vitamin D-Vitamin K (VIACTIV CALCIUM PLUS D) 650-12.5-40 MG-MCG-MCG CHEW, Viactiv, Disp: , Rfl:  .  diphenhydrAMINE (BENADRYL) 25 MG tablet, Take 50 mg by mouth daily as needed for itching or allergies. , Disp: , Rfl:  .  diphenhydramine-acetaminophen  (TYLENOL PM) 25-500 MG TABS, Take 1 tablet by mouth at bedtime as needed (sleep). , Disp: , Rfl:  .  docusate sodium (COLACE) 100 MG capsule, Take 1 capsule (100 mg total) by mouth 2 (two) times daily. (Patient taking differently: Take 100 mg by mouth daily as needed. ), Disp: 10 capsule, Rfl: 0 .  Melatonin 3 MG TABS, Take 2 tablets by mouth at bedtime as needed (sleep). , Disp: , Rfl:  .  meloxicam (MOBIC) 15 MG tablet, Take 15 mg by mouth daily., Disp: , Rfl:  .  methocarbamol (ROBAXIN) 500 MG tablet, Take 1 tablet (500 mg total) by mouth every 6 (six) hours as needed for muscle spasms., Disp: 30 tablet, Rfl: 0 .  tizanidine (ZANAFLEX) 2 MG capsule, Take 2 mg by mouth 3 (three) times daily., Disp: , Rfl:  .  triamcinolone cream (KENALOG) 0.1 %, triamcinolone acetonide 0.1 % topical cream  APPLY TO AFFECTED AREA TWICE A DAY, Disp: , Rfl:  .  Vitamin D, Ergocalciferol, (DRISDOL) 1.25 MG (50000 UT) CAPS capsule, Take 1 capsule (50,000 Units total) by mouth 2 (two) times a week., Disp: 24 capsule, Rfl: 3 No current facility-administered medications for this visit.   Facility-Administered Medications Ordered in Other Visits:  .  fentaNYL (SUBLIMAZE) injection, , , Anesthesia Intra-op, Jenne Campus, CRNA, 50 mcg at 02/24/18 3875 .  lactated ringers  infusion, , , Continuous PRN, Rock, Oletta Darter, CRNA .  midazolam (VERSED) 5 MG/5ML injection, , , Anesthesia Intra-op, Jenne Campus, CRNA, 2 mg at 02/24/18 0703 .  sodium chloride 0.9 % injection 10 mL, 10 mL, Intravenous, PRN, Volanda Napoleon, MD, 10 mL at 06/17/12 1651  Allergies:  Allergies  Allergen Reactions  . Adhesive [Tape] Rash    Past Medical History, Surgical history, Social history, and Family History were reviewed and updated.  Review of Systems: Review of Systems  Constitutional: Negative.   HENT: Negative.   Eyes: Negative.   Respiratory: Negative.   Cardiovascular: Negative.   Gastrointestinal: Negative.    Genitourinary: Negative.   Musculoskeletal: Negative.   Skin: Negative.   Neurological: Negative.   Endo/Heme/Allergies: Negative.   Psychiatric/Behavioral: Negative.     Physical Exam:  vitals were not taken for this visit.   Physical Exam Vitals signs reviewed.  Constitutional:      Comments: Breast exam shows left breast with no masses, edema or erythema.  There is no left nipple discharge.  There is no left axillary adenopathy.  Right chest wall shows well-healed mastectomy.  There is no right chest wall nodules.  There is no right chest wall erythema.  There is no right axillary adenopathy.  HENT:     Head: Normocephalic and atraumatic.  Eyes:     Pupils: Pupils are equal, round, and reactive to light.  Neck:     Musculoskeletal: Normal range of motion.  Cardiovascular:     Rate and Rhythm: Normal rate and regular rhythm.     Heart sounds: Normal heart sounds.  Pulmonary:     Effort: Pulmonary effort is normal.     Breath sounds: Normal breath sounds.  Abdominal:     General: Bowel sounds are normal.     Palpations: Abdomen is soft.  Musculoskeletal: Normal range of motion.        General: No tenderness or deformity.  Lymphadenopathy:     Cervical: No cervical adenopathy.  Skin:    General: Skin is warm and dry.     Findings: No erythema or rash.  Neurological:     Mental Status: She is alert and oriented to person, place, and time.  Psychiatric:        Behavior: Behavior normal.        Thought Content: Thought content normal.        Judgment: Judgment normal.      Lab Results  Component Value Date   WBC 7.1 07/09/2019   HGB 12.9 07/09/2019   HCT 40.5 07/09/2019   MCV 97.4 07/09/2019   PLT 251 07/09/2019     Chemistry      Component Value Date/Time   NA 138 07/09/2019 0810   NA 141 07/28/2017 0911   NA 142 01/27/2017 0849   K 3.6 07/09/2019 0810   K 3.8 07/28/2017 0911   K 4.3 01/27/2017 0849   CL 103 07/09/2019 0810   CL 105 07/28/2017 0911    CO2 28 07/09/2019 0810   CO2 29 07/28/2017 0911   CO2 29 01/27/2017 0849   BUN 16 07/09/2019 0810   BUN 13 07/28/2017 0911   BUN 16.9 01/27/2017 0849   CREATININE 0.76 07/09/2019 0810   CREATININE 0.8 07/28/2017 0911   CREATININE 0.8 01/27/2017 0849      Component Value Date/Time   CALCIUM 9.9 07/09/2019 0810   CALCIUM 10.0 07/28/2017 0911   CALCIUM 10.5 (H) 01/27/2017 0849   ALKPHOS 62  07/09/2019 0810   ALKPHOS 64 07/28/2017 0911   ALKPHOS 67 01/27/2017 0849   AST 17 07/09/2019 0810   AST 20 01/27/2017 0849   ALT 14 07/09/2019 0810   ALT 19 07/28/2017 0911   ALT 16 01/27/2017 0849   BILITOT 0.5 07/09/2019 0810   BILITOT 0.53 01/27/2017 0849         Impression and Plan: Maureen Duncan is 68 year old African female. She has a history of stage IIb ductal carcinoma of the right breast. Her tumor was HER-2 positive. She was treated with systemic chemotherapy followed by a year of Herceptin. She completed this in August of 2014.  Of note, she had 3 positive lymph nodes. She did receive 6 cycles of chemotherapy with Taxotere and carboplatinum.  I do not see any evidence of recurrent disease.  I realize that she certainly is at risk for recurrence.  Hopefully, her grandson will not have to be leaving her.  This is very important for her and really makes her life much more livable with him and it.  I will plan to see her back in 6 months.     Volanda Napoleon, MD 9/11/20208:50 AM

## 2019-09-13 ENCOUNTER — Other Ambulatory Visit (INDEPENDENT_AMBULATORY_CARE_PROVIDER_SITE_OTHER): Payer: Self-pay | Admitting: Orthopedic Surgery

## 2019-09-13 ENCOUNTER — Other Ambulatory Visit: Payer: Self-pay | Admitting: Hematology & Oncology

## 2019-09-13 NOTE — Telephone Encounter (Signed)
Please advise 

## 2019-09-17 ENCOUNTER — Telehealth: Payer: Self-pay | Admitting: Orthopedic Surgery

## 2019-09-17 NOTE — Telephone Encounter (Signed)
Patient called asked if she can be referred to a foot Specialist? The number to contact patient is [phone]

## 2019-09-17 NOTE — Telephone Encounter (Signed)
Please advise 

## 2019-09-18 NOTE — Telephone Encounter (Signed)
Pls refer to [facility]uda for eval next week for foot issue thx

## 2019-09-20 NOTE — Telephone Encounter (Signed)
Can we schedule her with Sharol Given please per Marlou Sa

## 2019-09-30 DIAGNOSIS — M722 Plantar fascial fibromatosis: Secondary | ICD-10-CM | POA: Diagnosis not present

## 2019-09-30 DIAGNOSIS — M7732 Calcaneal spur, left foot: Secondary | ICD-10-CM | POA: Diagnosis not present

## 2019-09-30 DIAGNOSIS — M71572 Other bursitis, not elsewhere classified, left ankle and foot: Secondary | ICD-10-CM | POA: Diagnosis not present

## 2019-10-11 DIAGNOSIS — M722 Plantar fascial fibromatosis: Secondary | ICD-10-CM | POA: Diagnosis not present

## 2019-10-11 DIAGNOSIS — M71572 Other bursitis, not elsewhere classified, left ankle and foot: Secondary | ICD-10-CM | POA: Diagnosis not present

## 2019-10-19 DIAGNOSIS — M722 Plantar fascial fibromatosis: Secondary | ICD-10-CM | POA: Diagnosis not present

## 2019-10-19 DIAGNOSIS — M71572 Other bursitis, not elsewhere classified, left ankle and foot: Secondary | ICD-10-CM | POA: Diagnosis not present

## 2019-11-01 ENCOUNTER — Inpatient Hospital Stay (HOSPITAL_COMMUNITY)
Admission: EM | Admit: 2019-11-01 | Discharge: 2019-11-05 | DRG: 184 | Disposition: A | Discharge status: Home Health | Payer: Federal, State, Local not specified - PPO | Attending: General Surgery | Admitting: General Surgery

## 2019-11-01 ENCOUNTER — Other Ambulatory Visit: Payer: Self-pay

## 2019-11-01 ENCOUNTER — Encounter (HOSPITAL_COMMUNITY): Payer: Self-pay | Admitting: Emergency Medicine

## 2019-11-01 DIAGNOSIS — S0990XA Unspecified injury of head, initial encounter: Secondary | ICD-10-CM | POA: Diagnosis not present

## 2019-11-01 DIAGNOSIS — S2242XA Multiple fractures of ribs, left side, initial encounter for closed fracture: Secondary | ICD-10-CM | POA: Diagnosis not present

## 2019-11-01 DIAGNOSIS — M810 Age-related osteoporosis without current pathological fracture: Secondary | ICD-10-CM | POA: Diagnosis not present

## 2019-11-01 DIAGNOSIS — Z20822 Contact with and (suspected) exposure to covid-19: Secondary | ICD-10-CM | POA: Diagnosis not present

## 2019-11-01 DIAGNOSIS — Z23 Encounter for immunization: Secondary | ICD-10-CM

## 2019-11-01 DIAGNOSIS — S3600XA Unspecified injury of spleen, initial encounter: Secondary | ICD-10-CM | POA: Diagnosis not present

## 2019-11-01 DIAGNOSIS — S2249XA Multiple fractures of ribs, unspecified side, initial encounter for closed fracture: Principal | ICD-10-CM | POA: Diagnosis present

## 2019-11-01 DIAGNOSIS — E876 Hypokalemia: Secondary | ICD-10-CM | POA: Diagnosis not present

## 2019-11-01 DIAGNOSIS — Z91048 Other nonmedicinal substance allergy status: Secondary | ICD-10-CM

## 2019-11-01 DIAGNOSIS — Z96652 Presence of left artificial knee joint: Secondary | ICD-10-CM | POA: Diagnosis present

## 2019-11-01 DIAGNOSIS — R0902 Hypoxemia: Secondary | ICD-10-CM | POA: Diagnosis not present

## 2019-11-01 DIAGNOSIS — Z86718 Personal history of other venous thrombosis and embolism: Secondary | ICD-10-CM

## 2019-11-01 DIAGNOSIS — M1990 Unspecified osteoarthritis, unspecified site: Secondary | ICD-10-CM | POA: Diagnosis not present

## 2019-11-01 DIAGNOSIS — K219 Gastro-esophageal reflux disease without esophagitis: Secondary | ICD-10-CM | POA: Diagnosis not present

## 2019-11-01 DIAGNOSIS — Z87891 Personal history of nicotine dependence: Secondary | ICD-10-CM

## 2019-11-01 DIAGNOSIS — R000 Tachycardia, unspecified: Secondary | ICD-10-CM | POA: Diagnosis not present

## 2019-11-01 DIAGNOSIS — Z8249 Family history of ischemic heart disease and other diseases of the circulatory system: Secondary | ICD-10-CM

## 2019-11-01 DIAGNOSIS — S199XXA Unspecified injury of neck, initial encounter: Secondary | ICD-10-CM | POA: Diagnosis not present

## 2019-11-01 DIAGNOSIS — T07XXXA Unspecified multiple injuries, initial encounter: Secondary | ICD-10-CM | POA: Diagnosis present

## 2019-11-01 DIAGNOSIS — F10129 Alcohol abuse with intoxication, unspecified: Secondary | ICD-10-CM | POA: Diagnosis not present

## 2019-11-01 DIAGNOSIS — Z833 Family history of diabetes mellitus: Secondary | ICD-10-CM

## 2019-11-01 DIAGNOSIS — Z1231 Encounter for screening mammogram for malignant neoplasm of breast: Secondary | ICD-10-CM | POA: Diagnosis not present

## 2019-11-01 DIAGNOSIS — F10929 Alcohol use, unspecified with intoxication, unspecified: Secondary | ICD-10-CM | POA: Diagnosis not present

## 2019-11-01 DIAGNOSIS — E785 Hyperlipidemia, unspecified: Secondary | ICD-10-CM | POA: Diagnosis present

## 2019-11-01 DIAGNOSIS — S2239XA Fracture of one rib, unspecified side, initial encounter for closed fracture: Secondary | ICD-10-CM

## 2019-11-01 DIAGNOSIS — I447 Left bundle-branch block, unspecified: Secondary | ICD-10-CM | POA: Diagnosis present

## 2019-11-01 DIAGNOSIS — S301XXA Contusion of abdominal wall, initial encounter: Secondary | ICD-10-CM | POA: Diagnosis not present

## 2019-11-01 DIAGNOSIS — Z853 Personal history of malignant neoplasm of breast: Secondary | ICD-10-CM

## 2019-11-01 DIAGNOSIS — W109XXA Fall (on) (from) unspecified stairs and steps, initial encounter: Secondary | ICD-10-CM | POA: Diagnosis present

## 2019-11-01 DIAGNOSIS — S36020A Minor contusion of spleen, initial encounter: Secondary | ICD-10-CM | POA: Diagnosis not present

## 2019-11-01 DIAGNOSIS — I10 Essential (primary) hypertension: Secondary | ICD-10-CM | POA: Diagnosis not present

## 2019-11-01 DIAGNOSIS — Z9011 Acquired absence of right breast and nipple: Secondary | ICD-10-CM | POA: Diagnosis not present

## 2019-11-01 DIAGNOSIS — Z800 Family history of malignant neoplasm of digestive organs: Secondary | ICD-10-CM

## 2019-11-01 DIAGNOSIS — Z841 Family history of disorders of kidney and ureter: Secondary | ICD-10-CM

## 2019-11-01 DIAGNOSIS — Z791 Long term (current) use of non-steroidal anti-inflammatories (NSAID): Secondary | ICD-10-CM

## 2019-11-01 DIAGNOSIS — Z801 Family history of malignant neoplasm of trachea, bronchus and lung: Secondary | ICD-10-CM

## 2019-11-01 DIAGNOSIS — W19XXXA Unspecified fall, initial encounter: Secondary | ICD-10-CM | POA: Diagnosis not present

## 2019-11-01 DIAGNOSIS — R52 Pain, unspecified: Secondary | ICD-10-CM | POA: Diagnosis not present

## 2019-11-01 HISTORY — DX: Multiple fractures of ribs, unspecified side, initial encounter for closed fracture: S22.49XA

## 2019-11-01 MED ORDER — ONDANSETRON HCL 4 MG/2ML IJ SOLN
4.0000 mg | Freq: Once | INTRAMUSCULAR | Status: AC
  Administered 2019-11-02: 4 mg via INTRAVENOUS
  Filled 2019-11-01: qty 2

## 2019-11-01 MED ORDER — SODIUM CHLORIDE 0.9 % IV BOLUS
1000.0000 mL | Freq: Once | INTRAVENOUS | Status: AC
  Administered 2019-11-02: 1000 mL via INTRAVENOUS

## 2019-11-01 MED ORDER — LIDOCAINE 5 % EX PTCH
1.0000 | MEDICATED_PATCH | Freq: Once | CUTANEOUS | Status: AC
  Administered 2019-11-02: 1 via TRANSDERMAL
  Filled 2019-11-01: qty 1

## 2019-11-01 NOTE — ED Triage Notes (Addendum)
Pt presents by [facility] for evaluation of alcohol intoxication with abdominal pain  that started after drinking 4-5 mixed drinks. Pt also reports falling and landing on left side of abdomen.

## 2019-11-01 NOTE — ED Provider Notes (Addendum)
[place] DEPT Provider Note   CSN: [id number] Arrival date & time: 11/01/19  2247     History Chief Complaint  Patient presents with  . Alcohol Intoxication    Maureen Duncan is a 69 y.o. female with past medical history significant for arthritis, breast cancer, DVT presents to emergency department today via EMS with chief complaint of alcohol intoxication and abdominal pain. Onset was acute happening just prior to arrival. Her daughter witness the fall and called 911.  Patient states she drank 4-5 mixed drinks tonight and fell walking down the stairs. She is unsure how many stairs she fell down, but thinks she was near the last few steps. She has pain to her left ribs. She admits to nausea with 1 episode of non bloody and non bilious emesis. She denies hitting her head or loss of consciousness. Patient admits to drinking 4-5 mixed drinks tonight. She is unable to describe her pain just states "it hurts." She did not take anything for pain prior to arrival.  She denies fever, chills, headache, neck pain, back pain, urinary symptoms, diarrhea, numbness, weakness, sensory or motor changes. History provided by patient with additional history obtained from chart review.      Past Medical History:  Diagnosis Date  . Arthritis   . Breast cancer [facility]) right breast dx 12/ 2000;  recurrence right breast 01/30/12   oncologist-  dr Marin Olp--  Stage IIb, grade 3, (T2n1M0) ductal carcinoma right breast-ER negative/ HER-2 positive---  s/p  right modified mastectomy with node dissection (3 positive) and chemotherapy (ended aug 2013) and Herceptin ended Aug 2014  . Cystocele   . GERD (gastroesophageal reflux disease)    occasionally, takes Zantac OTC when needed  . History of DVT (deep vein thrombosis)    left subclavian vein -- port of PAC--  removed 06-23-2012  . Hypertension   . LBBB (left bundle branch block)   . Lymphedema of upper extremity    right arm  .  Postmenopausal osteoporosis   . Uterine prolapse   . Wears partial dentures    upper    Patient Active Problem List   Diagnosis Date Noted  . Arthritis of knee 04/07/2018  . Unilateral primary osteoarthritis, left knee   . Prolapse of female bladder, acquired 05/08/2015  . Right elbow pain 07/25/2014  . Hyperlipidemia 04/28/2014  . Allergic rhinitis 04/28/2014  . Breast cancer, right. T2, N1. Mastectomy 01/30/2012. 01/09/2012    Past Surgical History:  Procedure Laterality Date  . ABDOMINAL HYSTERECTOMY    . BREAST LUMPECTOMY Right Jan 2001  . COLONOSCOPY  last one -- June 2016   WNL  . CYSTOCELE REPAIR N/A 05/08/2015   Procedure: ANTERIOR REPAIR (CYSTOCELE);  Surgeon: Molli Posey, MD;  Location: [facility];  Service: Gynecology;  Laterality: N/A;  . LAPAROSCOPIC ASSISTED VAGINAL HYSTERECTOMY N/A 05/08/2015   Procedure: LAPAROSCOPIC ASSISTED VAGINAL HYSTERECTOMY;  Surgeon: Molli Posey, MD;  Location: [place];  Service: Gynecology;  Laterality: N/A;  . LAPAROSCOPIC BILATERAL SALPINGO OOPHERECTOMY Bilateral 05/08/2015   Procedure: LAPAROSCOPIC BILATERAL SALPINGO OOPHORECTOMY;  Surgeon: Molli Posey, MD;  Location: [place];  Service: Gynecology;  Laterality: Bilateral;  . MODIFIED RADICAL MASTECTOMY W/ AXILLARY LYMPH NODE DISSECTION Right 01/30/12  . PORT-A-CATH REMOVAL  06/23/2012   Procedure: MINOR REMOVAL PORT-A-CATH;  Surgeon: Shann Medal, MD;  Location: [place];  Service: General;  Laterality: Left;  . PORTACATH PLACEMENT  01/30/2012   Procedure: INSERTION  PORT-A-CATH;  Surgeon: Shann Medal, MD;  Location: [place];  Service: General;  Laterality: Left;  . TOTAL KNEE ARTHROPLASTY Left 04/07/2018  . TOTAL KNEE ARTHROPLASTY Left 04/07/2018   Procedure: LEFT TOTAL KNEE ARTHROPLASTY;  Surgeon: Meredith Pel, MD;  Location: [place];  Service: Orthopedics;  Laterality: Left;  . TRANSTHORACIC ECHOCARDIOGRAM   08-24-2012   grade I diastolic dysfunction,  ef 45-50%/  trivial MR  . TUBAL LIGATION  1980     OB History   No obstetric history on file.     Family History  Problem Relation Age of Onset  . Diabetes Mother   . Hypertension Mother   . Kidney failure Mother   . Kidney disease Mother   . Cancer Mother        pancreatic  . Other Brother        heart transplant  . Cancer Maternal Uncle        lung    Social History   Tobacco Use  . Smoking status: Former Smoker    Packs/day: 0.25    Years: 2.00    Pack years: 0.50    Types: Cigarettes    Start date: 03/31/2010    Quit date: 11/11/2011    Years since quitting: 7.9  . Smokeless tobacco: Never Used  . Tobacco comment: social smoker  Substance Use Topics  . Alcohol use: Yes    Comment: occasional  . Drug use: No    Home Medications Prior to Admission medications   Medication Sig Start Date End Date Taking? Authorizing Provider  acetaminophen (TYLENOL) 500 MG tablet Take 1,000 mg by mouth every 8 (eight) hours as needed for mild pain or fever.     [provider]  amLODipine-benazepril (LOTREL) 5-20 MG capsule TAKE 1 CAPSULE BY MOUTH EVERY DAY 09/13/19   Volanda Napoleon, MD  amoxicillin (AMOXIL) 500 MG tablet TAKE 4 TABLETS BY MOUTH 1 HOUR PRIOR TO DENTAL PROCEDURE 09/13/19   Magnant, Gerrianne Scale, PA-C  ASPERCREME LIDOCAINE EX Apply 1 application topically 2 (two) times daily as needed (Knee pain).    [provider]  atorvastatin (LIPITOR) 40 MG tablet Take 40 mg by mouth daily.    [provider]  Calcium-Vitamin D-Vitamin K (VIACTIV CALCIUM PLUS D) 650-12.5-40 MG-MCG-MCG CHEW Viactiv    [provider]  diphenhydrAMINE (BENADRYL) 25 MG tablet Take 50 mg by mouth daily as needed for itching or allergies.     [provider]  diphenhydramine-acetaminophen (TYLENOL PM) 25-500 MG TABS Take 1 tablet by mouth at bedtime as needed (sleep).     [provider]  docusate  sodium (COLACE) 100 MG capsule Take 1 capsule (100 mg total) by mouth 2 (two) times daily. Patient taking differently: Take 100 mg by mouth daily as needed.  04/09/18   Meredith Pel, MD  Melatonin 3 MG TABS Take 2 tablets by mouth at bedtime as needed (sleep).     [provider]  meloxicam (MOBIC) 15 MG tablet Take 15 mg by mouth daily. 04/21/19   [provider]  methocarbamol (ROBAXIN) 500 MG tablet Take 1 tablet (500 mg total) by mouth every 6 (six) hours as needed for muscle spasms. 04/09/18   Meredith Pel, MD  tizanidine (ZANAFLEX) 2 MG capsule Take 2 mg by mouth 3 (three) times daily. 06/22/19   [provider]  triamcinolone cream (KENALOG) 0.1 % triamcinolone acetonide 0.1 % topical cream  APPLY TO AFFECTED AREA TWICE A DAY  [provider]  Vitamin D, Ergocalciferol, (DRISDOL) 1.25 MG (50000 UT) CAPS capsule Take 1 capsule (50,000 Units total) by mouth 2 (two) times a week. 01/14/19   Volanda Napoleon, MD  calcium-vitamin D (OSCAL) 250-125 MG-UNIT per tablet Take 1 tablet by mouth daily.    01/20/12  [provider]    Allergies    Adhesive [tape]  Review of Systems   Review of Systems  All other systems are reviewed and are negative for acute change except as noted in the HPI.   Physical Exam Updated Vital Signs BP (!) 168/98   Pulse 87   Temp 98.1 F (36.7 C) (Oral)   Resp 20   Ht 5' 5"  (1.651 m)   Wt 77.1 kg   SpO2 94%   BMI 28.29 kg/m   Physical Exam Vitals and nursing note reviewed.  Constitutional:      General: She is not in acute distress.    Appearance: She is not ill-appearing.  HENT:     Head: Normocephalic and atraumatic.     Comments: No tenderness to palpation of skull. No deformities or crepitus noted. No open wounds, abrasions or lacerations.     Right Ear: Tympanic membrane and external ear normal.     Left Ear: Tympanic membrane and external ear normal.     Nose: Nose normal.      Mouth/Throat:     Mouth: Mucous membranes are moist.     Pharynx: Oropharynx is clear.  Eyes:     General: No scleral icterus.       Right eye: No discharge.        Left eye: No discharge.     Extraocular Movements: Extraocular movements intact.     Conjunctiva/sclera: Conjunctivae normal.     Pupils: Pupils are equal, round, and reactive to light.  Neck:     Vascular: No JVD.  Cardiovascular:     Rate and Rhythm: Normal rate and regular rhythm.     Pulses: Normal pulses.          Radial pulses are 2+ on the right side and 2+ on the left side.     Heart sounds: Normal heart sounds.  Pulmonary:     Comments: Lungs clear to auscultation in all fields. Symmetric chest rise. No wheezing, rales, or rhonchi. Chest:       Comments: Tenderness to palpation as depicted in image above. No overlying skin changes. No deformity or crepitus noted.  No evidence of flail chest.  Abdominal:     Comments: Abdomen is soft, non-distended, tender to palpation of LUQ. No rigidity, no guarding. No peritoneal signs. Normoactive bowel sounds.  Musculoskeletal:        General: Normal range of motion.     Cervical back: Normal range of motion.  Skin:    General: Skin is warm and dry.     Capillary Refill: Capillary refill takes less than 2 seconds.  Neurological:     Mental Status: She is oriented to person, place, and time.     GCS: GCS eye subscore is 4. GCS verbal subscore is 5. GCS motor subscore is 6.     Comments: Fluent clear speech, no facial droop. Follows commands. Strong and equal grip strength in bilateral upper and lower extremities.   Psychiatric:        Behavior: Behavior normal.        ED Results / Procedures / Treatments   Labs (all labs ordered are listed, but  only abnormal results are displayed) Labs Reviewed  COMPREHENSIVE METABOLIC PANEL - Abnormal; Notable for the following components:      Result Value   Potassium 2.8 (*)    CO2 19 (*)    Glucose, Bld 114 (*)    Calcium  7.7 (*)    Total Protein 5.4 (*)    Albumin 3.1 (*)    All other components within normal limits  ETHANOL - Abnormal; Notable for the following components:   Alcohol, Ethyl (B) 169 (*)    All other components within normal limits  CBC WITH DIFFERENTIAL/PLATELET - Abnormal; Notable for the following components:   WBC 14.0 (*)    RBC 3.49 (*)    Hemoglobin 11.0 (*)    HCT 35.0 (*)    MCV 100.3 (*)    Neutro Abs 12.8 (*)    Lymphs Abs 0.5 (*)    Abs Immature Granulocytes 0.14 (*)    All other components within normal limits  RESPIRATORY PANEL BY RT PCR (FLU A&B, COVID)  LIPASE, BLOOD    EKG EKG Interpretation  Date/Time:  Tuesday November 02 2019 03:59:51 EST Ventricular Rate:  104 PR Interval:  140 QRS Duration: 88 QT Interval:  380 QTC Calculation: 499 R Axis:   37 Text Interpretation: Sinus tachycardia Otherwise normal ECG Since last tracing rate faster delta waves not appreciated Confirmed by Rolland Porter [phone]) on 11/02/2019 4:26:40 AM   Radiology DG Ribs Unilateral W/Chest Left  Result Date: 11/02/2019 CLINICAL DATA:  Fall with left wrist pain EXAM: LEFT RIBS AND CHEST - 3+ VIEW COMPARISON:  None. FINDINGS: Normal cardiomediastinal contours. Right basilar atelectasis. There are right axillary surgical clips. There are fractures of the lateral left eighth and ninth ribs. IMPRESSION: 1. Fractures of the lateral left eighth and ninth ribs. 2. Right basilar atelectasis. No pneumothorax. Electronically Signed   By: Ulyses Jarred M.D.   On: 11/02/2019 00:36   CT Head Wo Contrast  Result Date: 11/02/2019 CLINICAL DATA:  69 year old female with fall. EXAM: CT HEAD WITHOUT CONTRAST CT CERVICAL SPINE WITHOUT CONTRAST TECHNIQUE: Multidetector CT imaging of the head and cervical spine was performed following the standard protocol without intravenous contrast. Multiplanar CT image reconstructions of the cervical spine were also generated. COMPARISON:  None. FINDINGS: CT HEAD FINDINGS Brain:  The ventricles and sulci appropriate size for patient's age. The gray-white matter discrimination is preserved. There is no acute intracranial hemorrhage. No mass effect or midline shift. No extra-axial fluid collection. Vascular: No hyperdense vessel or unexpected calcification. Skull: Normal. Negative for fracture or focal lesion. Sinuses/Orbits: The visualized paranasal sinuses and mastoid air cells are clear. Other: None CT CERVICAL SPINE FINDINGS Alignment: No acute subluxation. Skull base and vertebrae: No acute fracture. 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 Correlation with clinical exam and point tenderness and direct comparison with prior images, if available, recommended. Soft tissues and spinal canal: No prevertebral fluid or swelling. No visible canal hematoma. Disc levels: Multilevel degenerative changes with endplate irregularity and disc space narrowing. Upper chest: Right upper lobe streaky densities concerning for developing infiltrate, possibly atypical or viral etiology. Clinical correlation is recommended. Other: None IMPRESSION: 1. No acute intracranial pathology. 2. No acute/traumatic cervical spine pathology. 3. 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 Correlation with clinical exam and point tenderness and direct comparison with prior images, if available, recommended. 4. Right upper lobe streaky densities concerning for developing infiltrate,  possibly atypical or viral etiology. Clinical correlation is recommended. Electronically Signed   By: Anner Crete M.D.   On: 11/02/2019 03:52   CT Chest W Contrast  Result Date: 11/02/2019 CLINICAL DATA:  Nausea, vomiting, left upper quadrant tenderness, EXAM: CT CHEST, ABDOMEN, AND PELVIS WITH CONTRAST TECHNIQUE: Multidetector CT imaging of the chest, abdomen and pelvis was performed following the standard protocol during bolus administration of intravenous  contrast. CONTRAST:  147m OMNIPAQUE IOHEXOL 300 MG/ML  SOLN COMPARISON:  CT Mar 02, 2012 FINDINGS: CT CHEST FINDINGS Cardiovascular: The aortic root is suboptimally assessed given cardiac pulsation artifact. Normal caliber aorta. No acute luminal irregularity of the aorta. No periaortic stranding or hemorrhage. Normal 3 vessel branching of the arch. Proximal great vessels opacify normally. Mild cardiomegaly. Small volume pericardial fluid is similar to prior and likely within physiologic normal. Central pulmonary arteries are poorly opacified but normal caliber. No large filling defects are identified on this non tailored examination. Mediastinum/Nodes: No acute traumatic injury of the trachea. High attenuation material seen within the thoracic esophagus and proximal stomach. Correlate with high attenuation ingested material such as bismuth containing products. No esophageal thickening or extraluminal gas. No mediastinal, hilar or axillary adenopathy. Prior right axillary nodal dissection is noted. Lungs/Pleura: There is asymmetric volume loss in the right lung with mixed ground-glass and consolidative opacity throughout the posterolateral right lung with associated airways thickening and scattered secretions. Trace left effusion is seen. Small amount of patchy airspace disease and ground-glass opacities present the left lung base. Some dependent atelectatic changes are present as well. No pneumothorax. Musculoskeletal: Left seventh through tenth posterolateral minimally displaced rib fractures. Mild adjacent soft tissue contusion. No other acute osseous injury in the abdomen or pelvis. Postsurgical changes from right mastectomy and axillary nodal dissection. CT ABDOMEN PELVIS FINDINGS Hepatobiliary: No direct hepatic injury or perihepatic hematoma. No focal liver abnormality is seen. No gallstones, gallbladder wall thickening, or biliary dilatation. Pancreas: Unremarkable. No pancreatic ductal dilatation or  surrounding inflammatory changes. Spleen: Trace posteroinferior subcapsular hematoma without clear site of laceration in the spleen. Adrenals/Urinary Tract: No adrenal hemorrhage or suspicious adrenal lesions. No direct renal injury or perirenal hemorrhage. No suspicious renal lesions, urolithiasis or hydronephrosis. Bladder is distended without direct bladder injury. Stomach/Bowel: Distal esophagus, stomach and duodenal sweep are unremarkable. No small bowel wall thickening or dilatation. No evidence of obstruction. A normal appendix is visualized. Scattered colonic diverticula without focal pericolonic inflammation to suggest diverticulitis. Vascular/Lymphatic: Atherosclerotic plaque within the normal caliber aorta. No suspicious or enlarged lymph nodes in the included lymphatic chains. Reproductive: Uterus is surgically absent. No concerning adnexal lesions. Other: Trace amount mesenteric contusion in the left upper quadrant adjacent the spleen and splenic flexure. No active contrast extravasation. Musculoskeletal: No acute osseous injury in the abdomen or pelvis. IMPRESSION: Traumatic Findings: 1. Left seventh through tenth minimally displaced rib fractures. Adjacent body wall contusion. 2. Trace left pleural effusion without pneumothorax. 3. AAST Grade I Splenic Injury: Subcapsular hematoma involving less than 10% of the surface area without visible laceration. 4. Small amount of mesenteric contusion in the left upper quadrant adjacent the spleen and splenic flexure. Nontraumatic Findings: 1. Postsurgical changes from prior mastectomy and right axillary dissection. 2. Mixed ground-glass and airspace opacity throughout the right lung and minimally in the left lung base. Findings are worrisome for an acute infectious process including potential atypical viral pneumonia. Additional consideration could include sequela of aspiration. Radiation related changes are considered less likely given distribution and  appearance. 3.  Aortic Atherosclerosis (ICD10-I70.0). These results were called by telephone at the time of interpretation on 11/02/2019 at 4:17 am to provider Emeterio Reeve , who verbally acknowledged these results. Electronically Signed   By: Lovena Le M.D.   On: 11/02/2019 04:17   CT Cervical Spine Wo Contrast  Result Date: 11/02/2019 CLINICAL DATA:  69 year old female with fall. EXAM: CT HEAD WITHOUT CONTRAST CT CERVICAL SPINE WITHOUT CONTRAST TECHNIQUE: Multidetector CT imaging of the head and cervical spine was performed following the standard protocol without intravenous contrast. Multiplanar CT image reconstructions of the cervical spine were also generated. COMPARISON:  None. FINDINGS: CT HEAD FINDINGS Brain: The ventricles and sulci appropriate size for patient's age. The gray-white matter discrimination is preserved. There is no acute intracranial hemorrhage. No mass effect or midline shift. No extra-axial fluid collection. Vascular: No hyperdense vessel or unexpected calcification. Skull: Normal. Negative for fracture or focal lesion. Sinuses/Orbits: The visualized paranasal sinuses and mastoid air cells are clear. Other: None CT CERVICAL SPINE FINDINGS Alignment: No acute subluxation. Skull base and vertebrae: No acute fracture. 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 Correlation with clinical exam and point tenderness and direct comparison with prior images, if available, recommended. Soft tissues and spinal canal: No prevertebral fluid or swelling. No visible canal hematoma. Disc levels: Multilevel degenerative changes with endplate irregularity and disc space narrowing. Upper chest: Right upper lobe streaky densities concerning for developing infiltrate, possibly atypical or viral etiology. Clinical correlation is recommended. Other: None IMPRESSION: 1. No acute intracranial pathology. 2. No acute/traumatic cervical spine pathology. 3. Incomplete bony  fusion or old fracture of the posterior ring of C1 with nonunion. Correlation with clinical exam and point tenderness and direct comparison with prior images, if available, recommended. 4. Right upper lobe streaky densities concerning for developing infiltrate, possibly atypical or viral etiology. Clinical correlation is recommended. Electronically Signed   By: Anner Crete M.D.   On: 11/02/2019 03:52   CT ABDOMEN PELVIS W CONTRAST  Result Date: 11/02/2019 CLINICAL DATA:  Nausea, vomiting, left upper quadrant tenderness, EXAM: CT CHEST, ABDOMEN, AND PELVIS WITH CONTRAST TECHNIQUE: Multidetector CT imaging of the chest, abdomen and pelvis was performed following the standard protocol during bolus administration of intravenous contrast. CONTRAST:  137m OMNIPAQUE IOHEXOL 300 MG/ML  SOLN COMPARISON:  CT Mar 02, 2012 FINDINGS: CT CHEST FINDINGS Cardiovascular: The aortic root is suboptimally assessed given cardiac pulsation artifact. Normal caliber aorta. No acute luminal irregularity of the aorta. No periaortic stranding or hemorrhage. Normal 3 vessel branching of the arch. Proximal great vessels opacify normally. Mild cardiomegaly. Small volume pericardial fluid is similar to prior and likely within physiologic normal. Central pulmonary arteries are poorly opacified but normal caliber. No large filling defects are identified on this non tailored examination. Mediastinum/Nodes: No acute traumatic injury of the trachea. High attenuation material seen within the thoracic esophagus and proximal stomach. Correlate with high attenuation ingested material such as bismuth containing products. No esophageal thickening or extraluminal gas. No mediastinal, hilar or axillary adenopathy. Prior right axillary nodal dissection is noted. Lungs/Pleura: There is asymmetric volume loss in the right lung with mixed ground-glass and consolidative opacity throughout the posterolateral right lung with associated airways thickening and  scattered secretions. Trace left effusion is seen. Small amount of patchy airspace disease and ground-glass opacities present the left lung base. Some dependent atelectatic changes are present as well. No pneumothorax. Musculoskeletal: Left seventh through tenth posterolateral minimally displaced rib fractures. Mild adjacent soft  tissue contusion. No other acute osseous injury in the abdomen or pelvis. Postsurgical changes from right mastectomy and axillary nodal dissection. CT ABDOMEN PELVIS FINDINGS Hepatobiliary: No direct hepatic injury or perihepatic hematoma. No focal liver abnormality is seen. No gallstones, gallbladder wall thickening, or biliary dilatation. Pancreas: Unremarkable. No pancreatic ductal dilatation or surrounding inflammatory changes. Spleen: Trace posteroinferior subcapsular hematoma without clear site of laceration in the spleen. Adrenals/Urinary Tract: No adrenal hemorrhage or suspicious adrenal lesions. No direct renal injury or perirenal hemorrhage. No suspicious renal lesions, urolithiasis or hydronephrosis. Bladder is distended without direct bladder injury. Stomach/Bowel: Distal esophagus, stomach and duodenal sweep are unremarkable. No small bowel wall thickening or dilatation. No evidence of obstruction. A normal appendix is visualized. Scattered colonic diverticula without focal pericolonic inflammation to suggest diverticulitis. Vascular/Lymphatic: Atherosclerotic plaque within the normal caliber aorta. No suspicious or enlarged lymph nodes in the included lymphatic chains. Reproductive: Uterus is surgically absent. No concerning adnexal lesions. Other: Trace amount mesenteric contusion in the left upper quadrant adjacent the spleen and splenic flexure. No active contrast extravasation. Musculoskeletal: No acute osseous injury in the abdomen or pelvis. IMPRESSION: Traumatic Findings: 1. Left seventh through tenth minimally displaced rib fractures. Adjacent body wall contusion. 2.  Trace left pleural effusion without pneumothorax. 3. AAST Grade I Splenic Injury: Subcapsular hematoma involving less than 10% of the surface area without visible laceration. 4. Small amount of mesenteric contusion in the left upper quadrant adjacent the spleen and splenic flexure. Nontraumatic Findings: 1. Postsurgical changes from prior mastectomy and right axillary dissection. 2. Mixed ground-glass and airspace opacity throughout the right lung and minimally in the left lung base. Findings are worrisome for an acute infectious process including potential atypical viral pneumonia. Additional consideration could include sequela of aspiration. Radiation related changes are considered less likely given distribution and appearance. 3.  Aortic Atherosclerosis (ICD10-I70.0). These results were called by telephone at the time of interpretation on 11/02/2019 at 4:17 am to provider Emeterio Reeve , who verbally acknowledged these results. Electronically Signed   By: Lovena Le M.D.   On: 11/02/2019 04:17    Procedures Procedures (including critical care time)  Medications Ordered in ED Medications  lidocaine (LIDODERM) 5 % 1 patch (1 patch Transdermal Patch Applied 11/02/19 0034)  sodium chloride (PF) 0.9 % injection (has no administration in time range)  sodium chloride 0.9 % bolus 1,000 mL (0 mLs Intravenous Stopped 11/02/19 0414)  ondansetron (ZOFRAN) injection 4 mg (4 mg Intravenous Given 11/02/19 0033)  morphine 4 MG/ML injection 4 mg (4 mg Intravenous Given 11/02/19 0202)  potassium chloride 10 mEq in 100 mL IVPB (0 mEq Intravenous Stopped 11/02/19 0415)  potassium chloride SA (KLOR-CON) CR tablet 40 mEq (40 mEq Oral Given 11/02/19 0249)  iohexol (OMNIPAQUE) 300 MG/ML solution 100 mL (100 mLs Intravenous Contrast Given 11/02/19 0335)    ED Course  I have reviewed the triage vital signs and the nursing notes.  Pertinent labs & imaging results that were available during my care of the patient were reviewed by  me and considered in my medical decision making (see chart for details).  Clinical Course as of Nov 01 505  Mon Nov 01, 2519  223 69 year old female brought in by ambulance for evaluation of alcohol intoxication, abdominal pain and chest pain after a fall while drinking.  She is resting comfortably and has normal vital signs.  She is getting some imaging due to her intoxication and her pain.  Likely discharge if no acute findings.   [  MB]    Clinical Course User Index [MB] Hayden Rasmussen, MD   MDM Rules/Calculators/A&P                      Patient seen and examined. She is alert, well appearing, in no acute distress. Afebrile, with VSS. Airway is intact, she has clear breath sounds in all fields. She has abdominal tenderness to left upper quadrant, without peritoneal signs. No evidence of head trauma, however given she is has been drinking alcohol and fell will proceed with CTs scans of head, neck, and abdomen as well as xray of left ribs.   Xray of ribs shows fractures of the lateral left eighth and ninth ribs and right basilar atelectasis. No pneumothorax seen. Labs with leukocytosis of 14, hemoglobin 11 with baseline seeming to be 12-13.  Also with hypokalemia of 2.8, will replete with IV and p.o.  Lipase is within normal range.  Ethanol elevated at 169.  Normal renal function, normal liver enzymes.  Patient given IV fluids, morphine for pain. Covid test is pending.  CT with traumatic findings: -Left seventh through tenth minimally displaced rib fractures with adjacent body wall contusion.  -Trace left pleural effusion without pneumothorax. - AAST Grade I Splenic Injury: Subcapsular hematoma involving less than 10% of the surface area without visible laceration. -Small amount of mesenteric contusion in the left upper quadrant adjacent the spleen and splenic flexure.  Patient updated on imaging results.  She had SPO2 of 90% on room air, she was put on 2 L [place] with improvement to 97%.  Pain was improved after morphine.  Cased discussed with on call surgeon Dr. Ninfa Linden  who recommends admission at [facility]. Also discussed with trauma attending Dr. Bobbye Morton who recommends ED Transfer.  Case discussed with ED attending at [facility] Dr. Roxanne Mins who accepts patient in transfer.  This is been a shared ED visit with ED attending Dr. Melina Copa, personally saw and evaluated the patient.   Portions of this note were generated with Lobby[employer]. Dictation errors may occur despite best attempts at proofreading.  5:42 PM. 11/02/19 Upon further review of patient's chart I mistakenly gave diagnosis of OPEN rib fractures. Patient has CLOSED rib fractures and I have updated the diagnosis to correct this.    Final Clinical Impression(s) / ED Diagnoses Final diagnoses:  Injury of spleen, initial encounter  Hypokalemia  Alcoholic intoxication with complication [facility])  Multiple rib fractures    Rx / DC Orders ED Discharge Orders    None       Flint Melter 11/02/19 7092    Hayden Rasmussen, MD 11/02/19 1006    Briante Loveall, Turton E, PA-C 11/02/19 1743    Hayden Rasmussen, MD 11/03/19 [phone]

## 2019-11-02 ENCOUNTER — Inpatient Hospital Stay (HOSPITAL_COMMUNITY): Payer: Federal, State, Local not specified - PPO

## 2019-11-02 ENCOUNTER — Encounter (HOSPITAL_COMMUNITY): Payer: Self-pay

## 2019-11-02 ENCOUNTER — Emergency Department (HOSPITAL_COMMUNITY): Payer: Federal, State, Local not specified - PPO

## 2019-11-02 DIAGNOSIS — M1990 Unspecified osteoarthritis, unspecified site: Secondary | ICD-10-CM | POA: Diagnosis not present

## 2019-11-02 DIAGNOSIS — Z841 Family history of disorders of kidney and ureter: Secondary | ICD-10-CM | POA: Diagnosis not present

## 2019-11-02 DIAGNOSIS — S2249XA Multiple fractures of ribs, unspecified side, initial encounter for closed fracture: Secondary | ICD-10-CM | POA: Diagnosis not present

## 2019-11-02 DIAGNOSIS — I447 Left bundle-branch block, unspecified: Secondary | ICD-10-CM | POA: Diagnosis not present

## 2019-11-02 DIAGNOSIS — W109XXA Fall (on) (from) unspecified stairs and steps, initial encounter: Secondary | ICD-10-CM | POA: Diagnosis present

## 2019-11-02 DIAGNOSIS — Z801 Family history of malignant neoplasm of trachea, bronchus and lung: Secondary | ICD-10-CM | POA: Diagnosis not present

## 2019-11-02 DIAGNOSIS — Z853 Personal history of malignant neoplasm of breast: Secondary | ICD-10-CM | POA: Diagnosis not present

## 2019-11-02 DIAGNOSIS — S0990XA Unspecified injury of head, initial encounter: Secondary | ICD-10-CM | POA: Diagnosis not present

## 2019-11-02 DIAGNOSIS — Z791 Long term (current) use of non-steroidal anti-inflammatories (NSAID): Secondary | ICD-10-CM | POA: Diagnosis not present

## 2019-11-02 DIAGNOSIS — E785 Hyperlipidemia, unspecified: Secondary | ICD-10-CM | POA: Diagnosis not present

## 2019-11-02 DIAGNOSIS — Z833 Family history of diabetes mellitus: Secondary | ICD-10-CM | POA: Diagnosis not present

## 2019-11-02 DIAGNOSIS — E876 Hypokalemia: Secondary | ICD-10-CM | POA: Diagnosis not present

## 2019-11-02 DIAGNOSIS — I10 Essential (primary) hypertension: Secondary | ICD-10-CM | POA: Diagnosis not present

## 2019-11-02 DIAGNOSIS — Z96652 Presence of left artificial knee joint: Secondary | ICD-10-CM | POA: Diagnosis not present

## 2019-11-02 DIAGNOSIS — S199XXA Unspecified injury of neck, initial encounter: Secondary | ICD-10-CM | POA: Diagnosis not present

## 2019-11-02 DIAGNOSIS — S36030A Superficial (capsular) laceration of spleen, initial encounter: Secondary | ICD-10-CM | POA: Diagnosis not present

## 2019-11-02 DIAGNOSIS — Z9011 Acquired absence of right breast and nipple: Secondary | ICD-10-CM | POA: Diagnosis not present

## 2019-11-02 DIAGNOSIS — S2239XA Fracture of one rib, unspecified side, initial encounter for closed fracture: Secondary | ICD-10-CM | POA: Diagnosis not present

## 2019-11-02 DIAGNOSIS — Z87891 Personal history of nicotine dependence: Secondary | ICD-10-CM | POA: Diagnosis not present

## 2019-11-02 DIAGNOSIS — Z86718 Personal history of other venous thrombosis and embolism: Secondary | ICD-10-CM | POA: Diagnosis not present

## 2019-11-02 DIAGNOSIS — R079 Chest pain, unspecified: Secondary | ICD-10-CM | POA: Diagnosis not present

## 2019-11-02 DIAGNOSIS — T07XXXA Unspecified multiple injuries, initial encounter: Secondary | ICD-10-CM | POA: Diagnosis present

## 2019-11-02 DIAGNOSIS — F10129 Alcohol abuse with intoxication, unspecified: Secondary | ICD-10-CM | POA: Diagnosis not present

## 2019-11-02 DIAGNOSIS — Z800 Family history of malignant neoplasm of digestive organs: Secondary | ICD-10-CM | POA: Diagnosis not present

## 2019-11-02 DIAGNOSIS — Z20822 Contact with and (suspected) exposure to covid-19: Secondary | ICD-10-CM | POA: Diagnosis not present

## 2019-11-02 DIAGNOSIS — S36020A Minor contusion of spleen, initial encounter: Secondary | ICD-10-CM | POA: Diagnosis not present

## 2019-11-02 DIAGNOSIS — S2242XA Multiple fractures of ribs, left side, initial encounter for closed fracture: Secondary | ICD-10-CM | POA: Diagnosis not present

## 2019-11-02 DIAGNOSIS — S301XXA Contusion of abdominal wall, initial encounter: Secondary | ICD-10-CM | POA: Diagnosis not present

## 2019-11-02 DIAGNOSIS — M810 Age-related osteoporosis without current pathological fracture: Secondary | ICD-10-CM | POA: Diagnosis not present

## 2019-11-02 DIAGNOSIS — K219 Gastro-esophageal reflux disease without esophagitis: Secondary | ICD-10-CM | POA: Diagnosis not present

## 2019-11-02 DIAGNOSIS — Z23 Encounter for immunization: Secondary | ICD-10-CM | POA: Diagnosis not present

## 2019-11-02 DIAGNOSIS — Z91048 Other nonmedicinal substance allergy status: Secondary | ICD-10-CM | POA: Diagnosis not present

## 2019-11-02 DIAGNOSIS — Z8249 Family history of ischemic heart disease and other diseases of the circulatory system: Secondary | ICD-10-CM | POA: Diagnosis not present

## 2019-11-02 LAB — RESPIRATORY PANEL BY RT PCR (FLU A&B, COVID)
Influenza A by PCR: NEGATIVE
Influenza B by PCR: NEGATIVE
SARS Coronavirus 2 by RT PCR: NEGATIVE

## 2019-11-02 LAB — CBC WITH DIFFERENTIAL/PLATELET
Abs Immature Granulocytes: 0.14 10*3/uL — ABNORMAL HIGH (ref 0.00–0.07)
Basophils Absolute: 0 10*3/uL (ref 0.0–0.1)
Basophils Relative: 0 %
Eosinophils Absolute: 0 10*3/uL (ref 0.0–0.5)
Eosinophils Relative: 0 %
HCT: 35 % — ABNORMAL LOW (ref 36.0–46.0)
Hemoglobin: 11 g/dL — ABNORMAL LOW (ref 12.0–15.0)
Immature Granulocytes: 1 %
Lymphocytes Relative: 4 %
Lymphs Abs: 0.5 10*3/uL — ABNORMAL LOW (ref 0.7–4.0)
MCH: 31.5 pg (ref 26.0–34.0)
MCHC: 31.4 g/dL (ref 30.0–36.0)
MCV: 100.3 fL — ABNORMAL HIGH (ref 80.0–100.0)
Monocytes Absolute: 0.5 10*3/uL (ref 0.1–1.0)
Monocytes Relative: 4 %
Neutro Abs: 12.8 10*3/uL — ABNORMAL HIGH (ref 1.7–7.7)
Neutrophils Relative %: 91 %
Platelets: 195 10*3/uL (ref 150–400)
RBC: 3.49 MIL/uL — ABNORMAL LOW (ref 3.87–5.11)
RDW: 14 % (ref 11.5–15.5)
WBC: 14 10*3/uL — ABNORMAL HIGH (ref 4.0–10.5)
nRBC: 0 % (ref 0.0–0.2)

## 2019-11-02 LAB — COMPREHENSIVE METABOLIC PANEL
ALT: 19 U/L (ref 0–44)
AST: 24 U/L (ref 15–41)
Albumin: 3.1 g/dL — ABNORMAL LOW (ref 3.5–5.0)
Alkaline Phosphatase: 43 U/L (ref 38–126)
Anion gap: 12 (ref 5–15)
BUN: 8 mg/dL (ref 8–23)
CO2: 19 mmol/L — ABNORMAL LOW (ref 22–32)
Calcium: 7.7 mg/dL — ABNORMAL LOW (ref 8.9–10.3)
Chloride: 110 mmol/L (ref 98–111)
Creatinine, Ser: 0.5 mg/dL (ref 0.44–1.00)
GFR calc Af Amer: >60 mL/min (ref >60)
GFR calc non Af Amer: >60 mL/min (ref >60)
Glucose, Bld: 114 mg/dL — ABNORMAL HIGH (ref 70–99)
Potassium: 2.8 mmol/L — ABNORMAL LOW (ref 3.5–5.1)
Sodium: 141 mmol/L (ref 135–145)
Total Bilirubin: 0.3 mg/dL (ref 0.3–1.2)
Total Protein: 5.4 g/dL — ABNORMAL LOW (ref 6.5–8.1)

## 2019-11-02 LAB — ETHANOL: Alcohol, Ethyl (B): 169 mg/dL — ABNORMAL HIGH (ref <10)

## 2019-11-02 LAB — LIPASE, BLOOD: Lipase: 27 U/L (ref 11–51)

## 2019-11-02 MED ORDER — MORPHINE SULFATE (PF) 4 MG/ML IV SOLN
4.0000 mg | Freq: Once | INTRAVENOUS | Status: AC
  Administered 2019-11-02: 4 mg via INTRAVENOUS
  Filled 2019-11-02: qty 1

## 2019-11-02 MED ORDER — MORPHINE SULFATE (PF) 2 MG/ML IV SOLN: 2.0000 mg | INTRAVENOUS | Status: DC | PRN

## 2019-11-02 MED ORDER — SODIUM CHLORIDE (PF) 0.9 % IJ SOLN
INTRAMUSCULAR | Status: AC
  Filled 2019-11-02: qty 50

## 2019-11-02 MED ORDER — OXYCODONE HCL 5 MG PO TABS
10.0000 mg | ORAL_TABLET | ORAL | Status: DC | PRN
  Administered 2019-11-02 – 2019-11-03 (×2): 10 mg via ORAL
  Filled 2019-11-02 (×2): qty 2

## 2019-11-02 MED ORDER — MORPHINE SULFATE (PF) 2 MG/ML IV SOLN: 1.0000 mg | INTRAVENOUS | Status: DC | PRN

## 2019-11-02 MED ORDER — HYDROMORPHONE HCL 1 MG/ML IJ SOLN
1.0000 mg | INTRAMUSCULAR | Status: DC | PRN
  Administered 2019-11-02: 1 mg via INTRAVENOUS
  Filled 2019-11-02: qty 1

## 2019-11-02 MED ORDER — ONDANSETRON HCL 4 MG/2ML IJ SOLN
4.0000 mg | Freq: Four times a day (QID) | INTRAMUSCULAR | Status: DC | PRN
  Administered 2019-11-03: 4 mg via INTRAVENOUS
  Filled 2019-11-02: qty 2

## 2019-11-02 MED ORDER — POTASSIUM CHLORIDE 10 MEQ/100ML IV SOLN
10.0000 meq | Freq: Once | INTRAVENOUS | Status: AC
  Administered 2019-11-02: 10 meq via INTRAVENOUS
  Filled 2019-11-02: qty 100

## 2019-11-02 MED ORDER — POTASSIUM CHLORIDE IN NACL 20-0.9 MEQ/L-% IV SOLN
INTRAVENOUS | Status: DC
  Filled 2019-11-02 (×4): qty 1000

## 2019-11-02 MED ORDER — OXYCODONE HCL 5 MG PO TABS
5.0000 mg | ORAL_TABLET | ORAL | Status: DC | PRN
  Administered 2019-11-02 – 2019-11-03 (×2): 5 mg via ORAL
  Filled 2019-11-02 (×2): qty 1

## 2019-11-02 MED ORDER — METHOCARBAMOL 500 MG PO TABS
500.0000 mg | ORAL_TABLET | Freq: Four times a day (QID) | ORAL | Status: DC | PRN
  Administered 2019-11-02 – 2019-11-03 (×2): 500 mg via ORAL
  Filled 2019-11-02 (×2): qty 1

## 2019-11-02 MED ORDER — TIZANIDINE HCL 2 MG PO TABS
2.0000 mg | ORAL_TABLET | Freq: Three times a day (TID) | ORAL | Status: DC
  Administered 2019-11-02 – 2019-11-05 (×10): 2 mg via ORAL
  Filled 2019-11-02 (×14): qty 1

## 2019-11-02 MED ORDER — ACETAMINOPHEN 500 MG PO TABS
1000.0000 mg | ORAL_TABLET | Freq: Four times a day (QID) | ORAL | Status: DC
  Administered 2019-11-02 – 2019-11-05 (×12): 1000 mg via ORAL
  Filled 2019-11-02 (×13): qty 2

## 2019-11-02 MED ORDER — MORPHINE SULFATE (PF) 4 MG/ML IV SOLN: 4.0000 mg | INTRAVENOUS | Status: DC | PRN

## 2019-11-02 MED ORDER — TRAMADOL HCL 50 MG PO TABS
50.0000 mg | ORAL_TABLET | Freq: Four times a day (QID) | ORAL | Status: DC | PRN
  Administered 2019-11-03: 50 mg via ORAL
  Filled 2019-11-02: qty 1

## 2019-11-02 MED ORDER — MELOXICAM 7.5 MG PO TABS
15.0000 mg | ORAL_TABLET | Freq: Every day | ORAL | Status: DC
  Administered 2019-11-03: 15 mg via ORAL
  Filled 2019-11-02 (×3): qty 2

## 2019-11-02 MED ORDER — ENOXAPARIN SODIUM 40 MG/0.4ML ~~LOC~~ SOLN
40.0000 mg | Freq: Every day | SUBCUTANEOUS | Status: DC
  Administered 2019-11-03 – 2019-11-05 (×3): 40 mg via SUBCUTANEOUS
  Filled 2019-11-02 (×3): qty 0.4

## 2019-11-02 MED ORDER — POLYETHYLENE GLYCOL 3350 17 G PO PACK
17.0000 g | PACK | Freq: Every day | ORAL | Status: DC
  Administered 2019-11-04: 17 g via ORAL
  Filled 2019-11-02 (×2): qty 1

## 2019-11-02 MED ORDER — POTASSIUM CHLORIDE CRYS ER 20 MEQ PO TBCR
40.0000 meq | EXTENDED_RELEASE_TABLET | Freq: Once | ORAL | Status: AC
  Administered 2019-11-02: 40 meq via ORAL
  Filled 2019-11-02: qty 2

## 2019-11-02 MED ORDER — ONDANSETRON 4 MG PO TBDP
4.0000 mg | ORAL_TABLET | Freq: Four times a day (QID) | ORAL | Status: DC | PRN
  Administered 2019-11-02: 4 mg via ORAL
  Filled 2019-11-02: qty 1

## 2019-11-02 MED ORDER — IOHEXOL 300 MG/ML  SOLN
100.0000 mL | Freq: Once | INTRAMUSCULAR | Status: AC | PRN
  Administered 2019-11-02: 100 mL via INTRAVENOUS

## 2019-11-02 NOTE — ED Notes (Signed)
Pt transported to CT ?

## 2019-11-02 NOTE — ED Notes (Signed)
[facility] updated on negative covid test

## 2019-11-02 NOTE — ED Notes (Signed)
Pt assisted to bedside commode

## 2019-11-02 NOTE — ED Notes (Signed)
Lunch Tray Ordered @ 1132.  

## 2019-11-02 NOTE — ED Notes (Signed)
Pt transported to Xray. 

## 2019-11-02 NOTE — ED Notes (Addendum)
Daughter Linton Ham Toone) [phone]

## 2019-11-02 NOTE — ED Notes (Signed)
Patient presently on the phone with daughter.

## 2019-11-02 NOTE — H&P (Signed)
History   Maureen Duncan is an 69 y.o. female.   Chief Complaint:  Chief Complaint  Patient presents with  . Alcohol Intoxication    Alcohol Intoxication Associated symptoms include abdominal pain and chest pain. Pertinent negatives include no coughing.  This is a 69 year old female who fell reportedly walking downstairs.  She was brought to [facility] by EMS.  This was a witnessed fall.  She arrived complaining of left chest pain and abdominal pain.  She admitted to drinking several mixed drinks this evening but reports she does not drink regularly.  She did have one episode of emesis.  She denied loss of consciousness.  She denies neck pain.  She is otherwise without complaints.  She denies shortness of breath.  Past Medical History:  Diagnosis Date  . Arthritis   . Breast cancer [facility]) right breast dx 12/ 2000;  recurrence right breast 01/30/12   oncologist-  dr Marin Olp--  Stage IIb, grade 3, (T2n1M0) ductal carcinoma right breast-ER negative/ HER-2 positive---  s/p  right modified mastectomy with node dissection (3 positive) and chemotherapy (ended aug 2013) and Herceptin ended Aug 2014  . Cystocele   . GERD (gastroesophageal reflux disease)    occasionally, takes Zantac OTC when needed  . History of DVT (deep vein thrombosis)    left subclavian vein -- port of PAC--  removed 06-23-2012  . Hypertension   . LBBB (left bundle branch block)   . Lymphedema of upper extremity    right arm  . Postmenopausal osteoporosis   . Uterine prolapse   . Wears partial dentures    upper    Past Surgical History:  Procedure Laterality Date  . ABDOMINAL HYSTERECTOMY    . BREAST LUMPECTOMY Right Jan 2001  . COLONOSCOPY  last one -- June 2016   WNL  . CYSTOCELE REPAIR N/A 05/08/2015   Procedure: ANTERIOR REPAIR (CYSTOCELE);  Surgeon: Molli Posey, MD;  Location: [facility];  Service: Gynecology;  Laterality: N/A;  . LAPAROSCOPIC ASSISTED VAGINAL HYSTERECTOMY N/A  05/08/2015   Procedure: LAPAROSCOPIC ASSISTED VAGINAL HYSTERECTOMY;  Surgeon: Molli Posey, MD;  Location: [place];  Service: Gynecology;  Laterality: N/A;  . LAPAROSCOPIC BILATERAL SALPINGO OOPHERECTOMY Bilateral 05/08/2015   Procedure: LAPAROSCOPIC BILATERAL SALPINGO OOPHORECTOMY;  Surgeon: Molli Posey, MD;  Location: [place];  Service: Gynecology;  Laterality: Bilateral;  . MODIFIED RADICAL MASTECTOMY W/ AXILLARY LYMPH NODE DISSECTION Right 01/30/12  . PORT-A-CATH REMOVAL  06/23/2012   Procedure: MINOR REMOVAL PORT-A-CATH;  Surgeon: Shann Medal, MD;  Location: [place];  Service: General;  Laterality: Left;  . PORTACATH PLACEMENT  01/30/2012   Procedure: INSERTION PORT-A-CATH;  Surgeon: Shann Medal, MD;  Location: [place];  Service: General;  Laterality: Left;  . TOTAL KNEE ARTHROPLASTY Left 04/07/2018  . TOTAL KNEE ARTHROPLASTY Left 04/07/2018   Procedure: LEFT TOTAL KNEE ARTHROPLASTY;  Surgeon: Meredith Pel, MD;  Location: [place];  Service: Orthopedics;  Laterality: Left;  . TRANSTHORACIC ECHOCARDIOGRAM  08-24-2012   grade I diastolic dysfunction,  ef 45-50%/  trivial MR  . TUBAL LIGATION  1980    Family History  Problem Relation Age of Onset  . Diabetes Mother   . Hypertension Mother   . Kidney failure Mother   . Kidney disease Mother   . Cancer Mother        pancreatic  . Other Brother        heart transplant  . Cancer Maternal Uncle  lung   Social History:  reports that she quit smoking about 7 years ago. Her smoking use included cigarettes. She started smoking about 9 years ago. She has a 0.50 pack-year smoking history. She has never used smokeless tobacco. She reports current alcohol use. She reports that she does not use drugs.  Allergies   Allergies  Allergen Reactions  . Adhesive [Tape] Rash    Home Medications  (Not in a hospital admission)   Trauma Course   Results for orders placed or  performed during the hospital encounter of 11/01/19 (from the past 48 hour(s))  Comprehensive metabolic panel     Status: Abnormal   Collection Time: 11/02/19  1:22 AM  Result Value Ref Range   Sodium 141 135 - 145 mmol/L   Potassium 2.8 (L) 3.5 - 5.1 mmol/L   Chloride 110 98 - 111 mmol/L   CO2 19 (L) 22 - 32 mmol/L   Glucose, Bld 114 (H) 70 - 99 mg/dL   BUN 8 8 - 23 mg/dL   Creatinine, Ser 0.50 0.44 - 1.00 mg/dL   Calcium 7.7 (L) 8.9 - 10.3 mg/dL   Total Protein 5.4 (L) 6.5 - 8.1 g/dL   Albumin 3.1 (L) 3.5 - 5.0 g/dL   AST 24 15 - 41 U/L   ALT 19 0 - 44 U/L   Alkaline Phosphatase 43 38 - 126 U/L   Total Bilirubin 0.3 0.3 - 1.2 mg/dL   GFR calc non Af Amer >60 >60 mL/min   GFR calc Af Amer >60 >60 mL/min   Anion gap 12 5 - 15    Comment: Performed at [facility], [place] [address], [place], [place] [address]  CBC with Differential     Status: Abnormal   Collection Time: 11/02/19  1:22 AM  Result Value Ref Range   WBC 14.0 (H) 4.0 - 10.5 K/uL   RBC 3.49 (L) 3.87 - 5.11 MIL/uL   Hemoglobin 11.0 (L) 12.0 - 15.0 g/dL   HCT 35.0 (L) 36.0 - 46.0 %   MCV 100.3 (H) 80.0 - 100.0 fL   MCH 31.5 26.0 - 34.0 pg   MCHC 31.4 30.0 - 36.0 g/dL   RDW 14.0 11.5 - 15.5 %   Platelets 195 150 - 400 K/uL   nRBC 0.0 0.0 - 0.2 %   Neutrophils Relative % 91 %   Neutro Abs 12.8 (H) 1.7 - 7.7 K/uL   Lymphocytes Relative 4 %   Lymphs Abs 0.5 (L) 0.7 - 4.0 K/uL   Monocytes Relative 4 %   Monocytes Absolute 0.5 0.1 - 1.0 K/uL   Eosinophils Relative 0 %   Eosinophils Absolute 0.0 0.0 - 0.5 K/uL   Basophils Relative 0 %   Basophils Absolute 0.0 0.0 - 0.1 K/uL   Immature Granulocytes 1 %   Abs Immature Granulocytes 0.14 (H) 0.00 - 0.07 K/uL    Comment: Performed at [facility], [place] [address], [place], [place] [address]  Lipase, blood     Status: None   Collection Time: 11/02/19  1:22 AM  Result Value Ref Range   Lipase 27 11 - 51 U/L    Comment: Performed at  [facility], [place] [address], [place], [place] [address]  Ethanol     Status: Abnormal   Collection Time: 11/02/19  1:23 AM  Result Value Ref Range   Alcohol, Ethyl (B) 169 (H) <10 mg/dL    Comment: (NOTE) Lowest detectable limit for serum alcohol  is 10 mg/dL. For medical purposes only. Performed at [facility], [place] [address] [place], [place] [address]    DG Ribs Unilateral W/Chest Left  Result Date: 11/02/2019 CLINICAL DATA:  Fall with left wrist pain EXAM: LEFT RIBS AND CHEST - 3+ VIEW COMPARISON:  None. FINDINGS: Normal cardiomediastinal contours. Right basilar atelectasis. There are right axillary surgical clips. There are fractures of the lateral left eighth and ninth ribs. IMPRESSION: 1. Fractures of the lateral left eighth and ninth ribs. 2. Right basilar atelectasis. No pneumothorax. Electronically Signed   By: Ulyses Jarred M.D.   On: 11/02/2019 00:36   CT Head Wo Contrast  Result Date: 11/02/2019 CLINICAL DATA:  69 year old female with fall. EXAM: CT HEAD WITHOUT CONTRAST CT CERVICAL SPINE WITHOUT CONTRAST TECHNIQUE: Multidetector CT imaging of the head and cervical spine was performed following the standard protocol without intravenous contrast. Multiplanar CT image reconstructions of the cervical spine were also generated. COMPARISON:  None. FINDINGS: CT HEAD FINDINGS Brain: The ventricles and sulci appropriate size for patient's age. The gray-white matter discrimination is preserved. There is no acute intracranial hemorrhage. No mass effect or midline shift. No extra-axial fluid collection. Vascular: No hyperdense vessel or unexpected calcification. Skull: Normal. Negative for fracture or focal lesion. Sinuses/Orbits: The visualized paranasal sinuses and mastoid air cells are clear. Other: None CT CERVICAL SPINE FINDINGS Alignment: No acute subluxation. Skull base and vertebrae: No acute fracture. Incomplete bony fusion or old fracture of the  posterior ring of C1 with nonunion. Correlation with clinical exam and point tenderness and direct comparison with prior images, if available, recommended. Soft tissues and spinal canal: No prevertebral fluid or swelling. No visible canal hematoma. Disc levels: Multilevel degenerative changes with endplate irregularity and disc space narrowing. Upper chest: Right upper lobe streaky densities concerning for developing infiltrate, possibly atypical or viral etiology. Clinical correlation is recommended. Other: None IMPRESSION: 1. No acute intracranial pathology. 2. No acute/traumatic cervical spine pathology. 3. 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 Correlation with clinical exam and point tenderness and direct comparison with prior images, if available, recommended. 4. Right upper lobe streaky densities concerning for developing infiltrate, possibly atypical or viral etiology. Clinical correlation is recommended. Electronically Signed   By: Anner Crete M.D.   On: 11/02/2019 03:52   CT Chest W Contrast  Result Date: 11/02/2019 CLINICAL DATA:  Nausea, vomiting, left upper quadrant tenderness, EXAM: CT CHEST, ABDOMEN, AND PELVIS WITH CONTRAST TECHNIQUE: Multidetector CT imaging of the chest, abdomen and pelvis was performed following the standard protocol during bolus administration of intravenous contrast. CONTRAST:  134m OMNIPAQUE IOHEXOL 300 MG/ML  SOLN COMPARISON:  CT Mar 02, 2012 FINDINGS: CT CHEST FINDINGS Cardiovascular: The aortic root is suboptimally assessed given cardiac pulsation artifact. Normal caliber aorta. No acute luminal irregularity of the aorta. No periaortic stranding or hemorrhage. Normal 3 vessel branching of the arch. Proximal great vessels opacify normally. Mild cardiomegaly. Small volume pericardial fluid is similar to prior and likely within physiologic normal. Central pulmonary arteries are poorly opacified but normal caliber. No large  filling defects are identified on this non tailored examination. Mediastinum/Nodes: No acute traumatic injury of the trachea. High attenuation material seen within the thoracic esophagus and proximal stomach. Correlate with high attenuation ingested material such as bismuth containing products. No esophageal thickening or extraluminal gas. No mediastinal, hilar or axillary adenopathy. Prior right axillary nodal dissection is noted. Lungs/Pleura: There is asymmetric volume loss in the right  lung with mixed ground-glass and consolidative opacity throughout the posterolateral right lung with associated airways thickening and scattered secretions. Trace left effusion is seen. Small amount of patchy airspace disease and ground-glass opacities present the left lung base. Some dependent atelectatic changes are present as well. No pneumothorax. Musculoskeletal: Left seventh through tenth posterolateral minimally displaced rib fractures. Mild adjacent soft tissue contusion. No other acute osseous injury in the abdomen or pelvis. Postsurgical changes from right mastectomy and axillary nodal dissection. CT ABDOMEN PELVIS FINDINGS Hepatobiliary: No direct hepatic injury or perihepatic hematoma. No focal liver abnormality is seen. No gallstones, gallbladder wall thickening, or biliary dilatation. Pancreas: Unremarkable. No pancreatic ductal dilatation or surrounding inflammatory changes. Spleen: Trace posteroinferior subcapsular hematoma without clear site of laceration in the spleen. Adrenals/Urinary Tract: No adrenal hemorrhage or suspicious adrenal lesions. No direct renal injury or perirenal hemorrhage. No suspicious renal lesions, urolithiasis or hydronephrosis. Bladder is distended without direct bladder injury. Stomach/Bowel: Distal esophagus, stomach and duodenal sweep are unremarkable. No small bowel wall thickening or dilatation. No evidence of obstruction. A normal appendix is visualized. Scattered colonic diverticula  without focal pericolonic inflammation to suggest diverticulitis. Vascular/Lymphatic: Atherosclerotic plaque within the normal caliber aorta. No suspicious or enlarged lymph nodes in the included lymphatic chains. Reproductive: Uterus is surgically absent. No concerning adnexal lesions. Other: Trace amount mesenteric contusion in the left upper quadrant adjacent the spleen and splenic flexure. No active contrast extravasation. Musculoskeletal: No acute osseous injury in the abdomen or pelvis. IMPRESSION: Traumatic Findings: 1. Left seventh through tenth minimally displaced rib fractures. Adjacent body wall contusion. 2. Trace left pleural effusion without pneumothorax. 3. AAST Grade I Splenic Injury: Subcapsular hematoma involving less than 10% of the surface area without visible laceration. 4. Small amount of mesenteric contusion in the left upper quadrant adjacent the spleen and splenic flexure. Nontraumatic Findings: 1. Postsurgical changes from prior mastectomy and right axillary dissection. 2. Mixed ground-glass and airspace opacity throughout the right lung and minimally in the left lung base. Findings are worrisome for an acute infectious process including potential atypical viral pneumonia. Additional consideration could include sequela of aspiration. Radiation related changes are considered less likely given distribution and appearance. 3.  Aortic Atherosclerosis (ICD10-I70.0). These results were called by telephone at the time of interpretation on 11/02/2019 at 4:17 am to provider Emeterio Reeve , who verbally acknowledged these results. Electronically Signed   By: Lovena Le M.D.   On: 11/02/2019 04:17   CT Cervical Spine Wo Contrast  Result Date: 11/02/2019 CLINICAL DATA:  69 year old female with fall. EXAM: CT HEAD WITHOUT CONTRAST CT CERVICAL SPINE WITHOUT CONTRAST TECHNIQUE: Multidetector CT imaging of the head and cervical spine was performed following the standard protocol without intravenous  contrast. Multiplanar CT image reconstructions of the cervical spine were also generated. COMPARISON:  None. FINDINGS: CT HEAD FINDINGS Brain: The ventricles and sulci appropriate size for patient's age. The gray-white matter discrimination is preserved. There is no acute intracranial hemorrhage. No mass effect or midline shift. No extra-axial fluid collection. Vascular: No hyperdense vessel or unexpected calcification. Skull: Normal. Negative for fracture or focal lesion. Sinuses/Orbits: The visualized paranasal sinuses and mastoid air cells are clear. Other: None CT CERVICAL SPINE FINDINGS Alignment: No acute subluxation. Skull base and vertebrae: No acute fracture. 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 Correlation with clinical exam and point tenderness and direct comparison with prior images, if available, recommended. Soft tissues and spinal canal: No prevertebral fluid or swelling.  No visible canal hematoma. Disc levels: Multilevel degenerative changes with endplate irregularity and disc space narrowing. Upper chest: Right upper lobe streaky densities concerning for developing infiltrate, possibly atypical or viral etiology. Clinical correlation is recommended. Other: None IMPRESSION: 1. No acute intracranial pathology. 2. No acute/traumatic cervical spine pathology. 3. 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 Correlation with clinical exam and point tenderness and direct comparison with prior images, if available, recommended. 4. Right upper lobe streaky densities concerning for developing infiltrate, possibly atypical or viral etiology. Clinical correlation is recommended. Electronically Signed   By: Anner Crete M.D.   On: 11/02/2019 03:52   CT ABDOMEN PELVIS W CONTRAST  Result Date: 11/02/2019 CLINICAL DATA:  Nausea, vomiting, left upper quadrant tenderness, EXAM: CT CHEST, ABDOMEN, AND PELVIS WITH CONTRAST  TECHNIQUE: Multidetector CT imaging of the chest, abdomen and pelvis was performed following the standard protocol during bolus administration of intravenous contrast. CONTRAST:  129m OMNIPAQUE IOHEXOL 300 MG/ML  SOLN COMPARISON:  CT Mar 02, 2012 FINDINGS: CT CHEST FINDINGS Cardiovascular: The aortic root is suboptimally assessed given cardiac pulsation artifact. Normal caliber aorta. No acute luminal irregularity of the aorta. No periaortic stranding or hemorrhage. Normal 3 vessel branching of the arch. Proximal great vessels opacify normally. Mild cardiomegaly. Small volume pericardial fluid is similar to prior and likely within physiologic normal. Central pulmonary arteries are poorly opacified but normal caliber. No large filling defects are identified on this non tailored examination. Mediastinum/Nodes: No acute traumatic injury of the trachea. High attenuation material seen within the thoracic esophagus and proximal stomach. Correlate with high attenuation ingested material such as bismuth containing products. No esophageal thickening or extraluminal gas. No mediastinal, hilar or axillary adenopathy. Prior right axillary nodal dissection is noted. Lungs/Pleura: There is asymmetric volume loss in the right lung with mixed ground-glass and consolidative opacity throughout the posterolateral right lung with associated airways thickening and scattered secretions. Trace left effusion is seen. Small amount of patchy airspace disease and ground-glass opacities present the left lung base. Some dependent atelectatic changes are present as well. No pneumothorax. Musculoskeletal: Left seventh through tenth posterolateral minimally displaced rib fractures. Mild adjacent soft tissue contusion. No other acute osseous injury in the abdomen or pelvis. Postsurgical changes from right mastectomy and axillary nodal dissection. CT ABDOMEN PELVIS FINDINGS Hepatobiliary: No direct hepatic injury or perihepatic hematoma. No focal  liver abnormality is seen. No gallstones, gallbladder wall thickening, or biliary dilatation. Pancreas: Unremarkable. No pancreatic ductal dilatation or surrounding inflammatory changes. Spleen: Trace posteroinferior subcapsular hematoma without clear site of laceration in the spleen. Adrenals/Urinary Tract: No adrenal hemorrhage or suspicious adrenal lesions. No direct renal injury or perirenal hemorrhage. No suspicious renal lesions, urolithiasis or hydronephrosis. Bladder is distended without direct bladder injury. Stomach/Bowel: Distal esophagus, stomach and duodenal sweep are unremarkable. No small bowel wall thickening or dilatation. No evidence of obstruction. A normal appendix is visualized. Scattered colonic diverticula without focal pericolonic inflammation to suggest diverticulitis. Vascular/Lymphatic: Atherosclerotic plaque within the normal caliber aorta. No suspicious or enlarged lymph nodes in the included lymphatic chains. Reproductive: Uterus is surgically absent. No concerning adnexal lesions. Other: Trace amount mesenteric contusion in the left upper quadrant adjacent the spleen and splenic flexure. No active contrast extravasation. Musculoskeletal: No acute osseous injury in the abdomen or pelvis. IMPRESSION: Traumatic Findings: 1. Left seventh through tenth minimally displaced rib fractures. Adjacent body wall contusion. 2. Trace left pleural effusion without pneumothorax. 3. AAST Grade I Splenic Injury: Subcapsular  hematoma involving less than 10% of the surface area without visible laceration. 4. Small amount of mesenteric contusion in the left upper quadrant adjacent the spleen and splenic flexure. Nontraumatic Findings: 1. Postsurgical changes from prior mastectomy and right axillary dissection. 2. Mixed ground-glass and airspace opacity throughout the right lung and minimally in the left lung base. Findings are worrisome for an acute infectious process including potential atypical viral  pneumonia. Additional consideration could include sequela of aspiration. Radiation related changes are considered less likely given distribution and appearance. 3.  Aortic Atherosclerosis (ICD10-I70.0). These results were called by telephone at the time of interpretation on 11/02/2019 at 4:17 am to provider Emeterio Reeve , who verbally acknowledged these results. Electronically Signed   By: Lovena Le M.D.   On: 11/02/2019 04:17    Review of Systems  Respiratory: Negative for cough and shortness of breath.   Cardiovascular: Positive for chest pain.  Gastrointestinal: Positive for abdominal pain.  All other systems reviewed and are negative.   Blood pressure (!) 147/89, pulse (!) 117, temperature 98.1 F (36.7 C), temperature source Oral, resp. rate 19, height _0  (1.651 m), weight 77.1 kg, SpO2 97 %. Physical Exam Constitutional:      General: She is not in acute distress.    Appearance: Normal appearance.  HENT:     Head: Normocephalic and atraumatic.     Right Ear: External ear normal.     Left Ear: External ear normal.     Nose: Nose normal.  Eyes:     General: No scleral icterus.       Right eye: No discharge.        Left eye: No discharge.     Pupils: Pupils are equal, round, and reactive to light.  Cardiovascular:     Rate and Rhythm: Regular rhythm. Tachycardia present.     Pulses: Normal pulses.     Heart sounds: Normal heart sounds.  Pulmonary:     Effort: Pulmonary effort is normal. No respiratory distress.     Comments: Tenderness along the left lower lateral ribs Chest:     Chest wall: Tenderness present.  Abdominal:     General: There is no distension.     Palpations: Abdomen is soft.     Tenderness: There is abdominal tenderness.     Comments: Mild left upper quadrant tenderness  Musculoskeletal:        General: No swelling or deformity. Normal range of motion.     Cervical back: Normal range of motion and neck supple. No tenderness.  Skin:    General:  Skin is warm.     Coloration: Skin is not jaundiced.     Findings: No bruising.  Neurological:     General: No focal deficit present.     Mental Status: She is alert and oriented to person, place, and time.  Psychiatric:        Behavior: Behavior normal.        Judgment: Judgment normal.     Assessment/Plan Patient status post fall with multiple left rib fractures and a grade 1 splenic contusion  I explained the diagnoses to her.  She will need to be admitted to the, service.  She will need aggressive pulmonary toilet and pain control.  I discussed the reasons for this with her.  The splenic injury is minor and hopefully will not progress or require any intervention.  We will repeat her hemoglobin in the morning unless she develops hypotension.  Coralie Keens MD  11/02/2019, 5:24 AM   Procedures

## 2019-11-02 NOTE — ED Notes (Signed)
Pt on the phone with daughter.

## 2019-11-02 NOTE — ED Notes (Signed)
Report given to [facility] 

## 2019-11-02 NOTE — ED Notes (Signed)
[place], [place] and [place], [place] made aware of pt asking for pain meds.

## 2019-11-02 NOTE — Progress Notes (Signed)
Patient seen this afternoon with Dr. Grandville Silos.  Patient admitted around 0530am this morning at [facility] secondary to a fall down the steps with a grade 1 splenic contusion as well as multiple left sided rib fractures.  She is having pain with deep inspiration and when she tries to move.  She denies pain in her abdomen or anywhere else on her body.  She has tolerated her clear liquids with no issues.  No nausea or vomiting.  Follow up CXR stable as well.  We will take her off bedrest, give her a regular diet, multimodal pain control, and PT/OT eval and treat.  We discussed possible LOS with the patient.  All questions were answered.  Heart: regular Lungs: CTAB, somewhat shallow, but moving good air.  Significant left-sided chest wall tenderness.  No ecchymosis noted Abd: soft, NT, Nd,  Ext: MAE, NVI  Henreitta Cea 1:42 PM 11/02/2019

## 2019-11-02 NOTE — ED Notes (Signed)
[facility] at bedside to transport pt. Pt has jewelery in pink denture container. Pt also has cell phone in bag, with clothes and shoes.

## 2019-11-02 NOTE — ED Notes (Signed)
Surgeon at bedside.  

## 2019-11-02 NOTE — ED Notes (Addendum)
[facility] contacted and paperwork printed. Wont have a truck until after shift change

## 2019-11-02 NOTE — ED Notes (Signed)
Pt screaming out in pain. Pt demanding pain meds at this time. Pt asked to please refrain from yelling.

## 2019-11-03 ENCOUNTER — Encounter (HOSPITAL_COMMUNITY): Payer: Self-pay

## 2019-11-03 DIAGNOSIS — S2249XA Multiple fractures of ribs, unspecified side, initial encounter for closed fracture: Secondary | ICD-10-CM | POA: Diagnosis present

## 2019-11-03 DIAGNOSIS — S2239XA Fracture of one rib, unspecified side, initial encounter for closed fracture: Secondary | ICD-10-CM | POA: Diagnosis present

## 2019-11-03 LAB — CBC
HCT: 38.8 % (ref 36.0–46.0)
Hemoglobin: 11.9 g/dL — ABNORMAL LOW (ref 12.0–15.0)
MCH: 31 pg (ref 26.0–34.0)
MCHC: 30.7 g/dL (ref 30.0–36.0)
MCV: 101 fL — ABNORMAL HIGH (ref 80.0–100.0)
Platelets: 220 10*3/uL (ref 150–400)
RBC: 3.84 MIL/uL — ABNORMAL LOW (ref 3.87–5.11)
RDW: 14.1 % (ref 11.5–15.5)
WBC: 8.3 10*3/uL (ref 4.0–10.5)
nRBC: 0 % (ref 0.0–0.2)

## 2019-11-03 LAB — BASIC METABOLIC PANEL
Anion gap: 11 (ref 5–15)
BUN: 6 mg/dL — ABNORMAL LOW (ref 8–23)
CO2: 23 mmol/L (ref 22–32)
Calcium: 8.9 mg/dL (ref 8.9–10.3)
Chloride: 105 mmol/L (ref 98–111)
Creatinine, Ser: 0.59 mg/dL (ref 0.44–1.00)
GFR calc Af Amer: >60 mL/min (ref >60)
GFR calc non Af Amer: >60 mL/min (ref >60)
Glucose, Bld: 99 mg/dL (ref 70–99)
Potassium: 4.4 mmol/L (ref 3.5–5.1)
Sodium: 139 mmol/L (ref 135–145)

## 2019-11-03 MED ORDER — KETOROLAC TROMETHAMINE 15 MG/ML IJ SOLN
30.0000 mg | Freq: Four times a day (QID) | INTRAMUSCULAR | Status: DC
  Administered 2019-11-03 – 2019-11-05 (×7): 30 mg via INTRAVENOUS
  Filled 2019-11-03 (×7): qty 2

## 2019-11-03 MED ORDER — BENAZEPRIL HCL 20 MG PO TABS
20.0000 mg | ORAL_TABLET | Freq: Every day | ORAL | Status: DC
  Administered 2019-11-03 – 2019-11-05 (×3): 20 mg via ORAL
  Filled 2019-11-03 (×3): qty 1

## 2019-11-03 MED ORDER — METHOCARBAMOL 500 MG PO TABS
1000.0000 mg | ORAL_TABLET | Freq: Three times a day (TID) | ORAL | Status: DC
  Administered 2019-11-03 – 2019-11-05 (×6): 1000 mg via ORAL
  Filled 2019-11-03 (×6): qty 2

## 2019-11-03 MED ORDER — LIDOCAINE 5 % EX PTCH
1.0000 | MEDICATED_PATCH | CUTANEOUS | Status: DC
  Administered 2019-11-03 – 2019-11-05 (×3): 1 via TRANSDERMAL
  Filled 2019-11-03 (×3): qty 1

## 2019-11-03 MED ORDER — AMLODIPINE BESY-BENAZEPRIL HCL 5-20 MG PO CAPS: 1.0000 | ORAL_CAPSULE | Freq: Every day | ORAL | Status: DC

## 2019-11-03 MED ORDER — METHOCARBAMOL 500 MG PO TABS
500.0000 mg | ORAL_TABLET | Freq: Four times a day (QID) | ORAL | Status: DC
  Administered 2019-11-03 (×2): 500 mg via ORAL
  Filled 2019-11-03 (×2): qty 1

## 2019-11-03 MED ORDER — INFLUENZA VAC A&B SA ADJ QUAD 0.5 ML IM PRSY
0.5000 mL | PREFILLED_SYRINGE | INTRAMUSCULAR | Status: DC
  Filled 2019-11-03: qty 0.5

## 2019-11-03 MED ORDER — AMLODIPINE BESYLATE 5 MG PO TABS
5.0000 mg | ORAL_TABLET | Freq: Every day | ORAL | Status: DC
  Administered 2019-11-03 – 2019-11-05 (×3): 5 mg via ORAL
  Filled 2019-11-03 (×3): qty 1

## 2019-11-03 MED ORDER — OXYCODONE HCL 5 MG/5ML PO SOLN: 5.0000 mg | ORAL | Status: DC | PRN

## 2019-11-03 NOTE — ED Notes (Signed)
PureWick placed.

## 2019-11-03 NOTE — ED Notes (Signed)
Purewick removed. RN explained to pt that she has to get out of the bed to use the bathroom, pt stated she didn't know if she would be able to do that.

## 2019-11-03 NOTE — Progress Notes (Signed)
Patient ID: Maureen Duncan, female   DOB: 03/08/1951, 69 y.o.   MRN: [id number]       Subjective: Patient still with pain.  Has not been out of bed yet.  Having muscle spasm type pains.  Tolerating a solid diet.  No nausea or vomiting.  Voiding on a bed pan.  ROS: See above, otherwise other systems negative  Objective: Vital signs in last 24 hours: Pulse Rate:  [65-98] 70 (01/06 0715) Resp:  [11-26] 15 (01/06 0715) BP: (94-158)/(65-94) 142/86 (01/06 0715) SpO2:  [93 %-100 %] 98 % (01/06 0715)    Intake/Output from previous day: 01/05 0701 - 01/06 0700 In: 480 [P.O.:480] Out: 1800 [Urine:1800] Intake/Output this shift: Total I/O In: -  Out: 800 [Urine:800]  PE: Gen: NAD Heart: regular Lungs: CTAB, moving good air, nonlabored.  Left chest wall pain as expected Abd: soft, NT, ND, +BS Ext: MAE, NVI  Lab Results:  Recent Labs    11/02/19 0122 11/03/19 0625  WBC 14.0* 8.3  HGB 11.0* 11.9*  HCT 35.0* 38.8  PLT 195 220   BMET Recent Labs    11/02/19 0122 11/03/19 0625  NA 141 139  K 2.8* 4.4  CL 110 105  CO2 19* 23  GLUCOSE 114* 99  BUN 8 6*  CREATININE 0.50 0.59  CALCIUM 7.7* 8.9   PT/INR No results for input(s): LABPROT, INR in the last 72 hours. CMP     Component Value Date/Time   NA 139 11/03/2019 0625   NA 141 07/28/2017 0911   NA 142 01/27/2017 0849   K 4.4 11/03/2019 0625   K 3.8 07/28/2017 0911   K 4.3 01/27/2017 0849   CL 105 11/03/2019 0625   CL 105 07/28/2017 0911   CO2 23 11/03/2019 0625   CO2 29 07/28/2017 0911   CO2 29 01/27/2017 0849   GLUCOSE 99 11/03/2019 0625   GLUCOSE 102 07/28/2017 0911   BUN 6 (L) 11/03/2019 0625   BUN 13 07/28/2017 0911   BUN 16.9 01/27/2017 0849   CREATININE 0.59 11/03/2019 0625   CREATININE 0.76 07/09/2019 0810   CREATININE 0.8 07/28/2017 0911   CREATININE 0.8 01/27/2017 0849   CALCIUM 8.9 11/03/2019 0625   CALCIUM 10.0 07/28/2017 0911   CALCIUM 10.5 (H) 01/27/2017 0849   PROT 5.4 (L) 11/02/2019 0122    PROT 7.6 07/28/2017 0911   PROT 7.2 01/27/2017 0849   ALBUMIN 3.1 (L) 11/02/2019 0122   ALBUMIN 3.9 07/28/2017 0911   ALBUMIN 3.8 01/27/2017 0849   AST 24 11/02/2019 0122   AST 17 07/09/2019 0810   AST 20 01/27/2017 0849   ALT 19 11/02/2019 0122   ALT 14 07/09/2019 0810   ALT 19 07/28/2017 0911   ALT 16 01/27/2017 0849   ALKPHOS 43 11/02/2019 0122   ALKPHOS 64 07/28/2017 0911   ALKPHOS 67 01/27/2017 0849   BILITOT 0.3 11/02/2019 0122   BILITOT 0.5 07/09/2019 0810   BILITOT 0.53 01/27/2017 0849   GFRNONAA >60 11/03/2019 0625   GFRNONAA >60 07/09/2019 0810   GFRAA >60 11/03/2019 0625   GFRAA >60 07/09/2019 0810   Lipase     Component Value Date/Time   LIPASE 27 11/02/2019 0122       Studies/Results: DG Chest 1 View  Result Date: 11/02/2019 CLINICAL DATA:  68 year old female with left side chest pain since fall down stairs at home yesterday. EXAM: CHEST  1 VIEW COMPARISON:  Portable chest 0014 hours, CT Chest, Abdomen, and Pelvis 0341 hours today. FINDINGS:  Portable AP semi upright view at 0716 hours. Multilevel left side rib fractures better demonstrated by CT. No pneumothorax identified. Stable lung volumes and mediastinal contours. Visualized tracheal air column is within normal limits. Patchy bibasilar opacity persists. No pleural effusion is evident. Negative visible bowel gas pattern. Right axillary surgical clips again noted. IMPRESSION: 1. Stable ventilation from the earlier CT with patchy nonspecific bibasilar opacity. 2. Multilevel left side rib fractures better demonstrated by CT. No pneumothorax identified. Electronically Signed   By: Genevie Ann M.D.   On: 11/02/2019 07:43   DG Ribs Unilateral W/Chest Left  Result Date: 11/02/2019 CLINICAL DATA:  Fall with left wrist pain EXAM: LEFT RIBS AND CHEST - 3+ VIEW COMPARISON:  None. FINDINGS: Normal cardiomediastinal contours. Right basilar atelectasis. There are right axillary surgical clips. There are fractures of the lateral  left eighth and ninth ribs. IMPRESSION: 1. Fractures of the lateral left eighth and ninth ribs. 2. Right basilar atelectasis. No pneumothorax. Electronically Signed   By: Ulyses Jarred M.D.   On: 11/02/2019 00:36   CT Head Wo Contrast  Result Date: 11/02/2019 CLINICAL DATA:  69 year old female with fall. EXAM: CT HEAD WITHOUT CONTRAST CT CERVICAL SPINE WITHOUT CONTRAST TECHNIQUE: Multidetector CT imaging of the head and cervical spine was performed following the standard protocol without intravenous contrast. Multiplanar CT image reconstructions of the cervical spine were also generated. COMPARISON:  None. FINDINGS: CT HEAD FINDINGS Brain: The ventricles and sulci appropriate size for patient's age. The gray-white matter discrimination is preserved. There is no acute intracranial hemorrhage. No mass effect or midline shift. No extra-axial fluid collection. Vascular: No hyperdense vessel or unexpected calcification. Skull: Normal. Negative for fracture or focal lesion. Sinuses/Orbits: The visualized paranasal sinuses and mastoid air cells are clear. Other: None CT CERVICAL SPINE FINDINGS Alignment: No acute subluxation. Skull base and vertebrae: No acute fracture. 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 Correlation with clinical exam and point tenderness and direct comparison with prior images, if available, recommended. Soft tissues and spinal canal: No prevertebral fluid or swelling. No visible canal hematoma. Disc levels: Multilevel degenerative changes with endplate irregularity and disc space narrowing. Upper chest: Right upper lobe streaky densities concerning for developing infiltrate, possibly atypical or viral etiology. Clinical correlation is recommended. Other: None IMPRESSION: 1. No acute intracranial pathology. 2. No acute/traumatic cervical spine pathology. 3. 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 Correlation with  clinical exam and point tenderness and direct comparison with prior images, if available, recommended. 4. Right upper lobe streaky densities concerning for developing infiltrate, possibly atypical or viral etiology. Clinical correlation is recommended. Electronically Signed   By: Anner Crete M.D.   On: 11/02/2019 03:52   CT Chest W Contrast  Result Date: 11/02/2019 CLINICAL DATA:  Nausea, vomiting, left upper quadrant tenderness, EXAM: CT CHEST, ABDOMEN, AND PELVIS WITH CONTRAST TECHNIQUE: Multidetector CT imaging of the chest, abdomen and pelvis was performed following the standard protocol during bolus administration of intravenous contrast. CONTRAST:  127mL OMNIPAQUE IOHEXOL 300 MG/ML  SOLN COMPARISON:  CT Mar 02, 2012 FINDINGS: CT CHEST FINDINGS Cardiovascular: The aortic root is suboptimally assessed given cardiac pulsation artifact. Normal caliber aorta. No acute luminal irregularity of the aorta. No periaortic stranding or hemorrhage. Normal 3 vessel branching of the arch. Proximal great vessels opacify normally. Mild cardiomegaly. Small volume pericardial fluid is similar to prior and likely within physiologic normal. Central pulmonary arteries are poorly  opacified but normal caliber. No large filling defects are identified on this non tailored examination. Mediastinum/Nodes: No acute traumatic injury of the trachea. High attenuation material seen within the thoracic esophagus and proximal stomach. Correlate with high attenuation ingested material such as bismuth containing products. No esophageal thickening or extraluminal gas. No mediastinal, hilar or axillary adenopathy. Prior right axillary nodal dissection is noted. Lungs/Pleura: There is asymmetric volume loss in the right lung with mixed ground-glass and consolidative opacity throughout the posterolateral right lung with associated airways thickening and scattered secretions. Trace left effusion is seen. Small amount of patchy airspace disease  and ground-glass opacities present the left lung base. Some dependent atelectatic changes are present as well. No pneumothorax. Musculoskeletal: Left seventh through tenth posterolateral minimally displaced rib fractures. Mild adjacent soft tissue contusion. No other acute osseous injury in the abdomen or pelvis. Postsurgical changes from right mastectomy and axillary nodal dissection. CT ABDOMEN PELVIS FINDINGS Hepatobiliary: No direct hepatic injury or perihepatic hematoma. No focal liver abnormality is seen. No gallstones, gallbladder wall thickening, or biliary dilatation. Pancreas: Unremarkable. No pancreatic ductal dilatation or surrounding inflammatory changes. Spleen: Trace posteroinferior subcapsular hematoma without clear site of laceration in the spleen. Adrenals/Urinary Tract: No adrenal hemorrhage or suspicious adrenal lesions. No direct renal injury or perirenal hemorrhage. No suspicious renal lesions, urolithiasis or hydronephrosis. Bladder is distended without direct bladder injury. Stomach/Bowel: Distal esophagus, stomach and duodenal sweep are unremarkable. No small bowel wall thickening or dilatation. No evidence of obstruction. A normal appendix is visualized. Scattered colonic diverticula without focal pericolonic inflammation to suggest diverticulitis. Vascular/Lymphatic: Atherosclerotic plaque within the normal caliber aorta. No suspicious or enlarged lymph nodes in the included lymphatic chains. Reproductive: Uterus is surgically absent. No concerning adnexal lesions. Other: Trace amount mesenteric contusion in the left upper quadrant adjacent the spleen and splenic flexure. No active contrast extravasation. Musculoskeletal: No acute osseous injury in the abdomen or pelvis. IMPRESSION: Traumatic Findings: 1. Left seventh through tenth minimally displaced rib fractures. Adjacent body wall contusion. 2. Trace left pleural effusion without pneumothorax. 3. AAST Grade I Splenic Injury:  Subcapsular hematoma involving less than 10% of the surface area without visible laceration. 4. Small amount of mesenteric contusion in the left upper quadrant adjacent the spleen and splenic flexure. Nontraumatic Findings: 1. Postsurgical changes from prior mastectomy and right axillary dissection. 2. Mixed ground-glass and airspace opacity throughout the right lung and minimally in the left lung base. Findings are worrisome for an acute infectious process including potential atypical viral pneumonia. Additional consideration could include sequela of aspiration. Radiation related changes are considered less likely given distribution and appearance. 3.  Aortic Atherosclerosis (ICD10-I70.0). These results were called by telephone at the time of interpretation on 11/02/2019 at 4:17 am to provider Emeterio Reeve , who verbally acknowledged these results. Electronically Signed   By: Lovena Le M.D.   On: 11/02/2019 04:17   CT Cervical Spine Wo Contrast  Result Date: 11/02/2019 CLINICAL DATA:  69 year old female with fall. EXAM: CT HEAD WITHOUT CONTRAST CT CERVICAL SPINE WITHOUT CONTRAST TECHNIQUE: Multidetector CT imaging of the head and cervical spine was performed following the standard protocol without intravenous contrast. Multiplanar CT image reconstructions of the cervical spine were also generated. COMPARISON:  None. FINDINGS: CT HEAD FINDINGS Brain: The ventricles and sulci appropriate size for patient's age. The gray-white matter discrimination is preserved. There is no acute intracranial hemorrhage. No mass effect or midline shift. No extra-axial fluid collection. Vascular: No hyperdense vessel or unexpected calcification. Skull: Normal. Negative  for fracture or focal lesion. Sinuses/Orbits: The visualized paranasal sinuses and mastoid air cells are clear. Other: None CT CERVICAL SPINE FINDINGS Alignment: No acute subluxation. Skull base and vertebrae: No acute fracture. Incomplete bony fusion or old  fracture of the posterior ring of C1 with nonunion. Correlation with clinical exam and point tenderness and direct comparison with prior images, if available, recommended. Soft tissues and spinal canal: No prevertebral fluid or swelling. No visible canal hematoma. Disc levels: Multilevel degenerative changes with endplate irregularity and disc space narrowing. Upper chest: Right upper lobe streaky densities concerning for developing infiltrate, possibly atypical or viral etiology. Clinical correlation is recommended. Other: None IMPRESSION: 1. No acute intracranial pathology. 2. No acute/traumatic cervical spine pathology. 3. 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 Correlation with clinical exam and point tenderness and direct comparison with prior images, if available, recommended. 4. Right upper lobe streaky densities concerning for developing infiltrate, possibly atypical or viral etiology. Clinical correlation is recommended. Electronically Signed   By: Anner Crete M.D.   On: 11/02/2019 03:52   CT ABDOMEN PELVIS W CONTRAST  Result Date: 11/02/2019 CLINICAL DATA:  Nausea, vomiting, left upper quadrant tenderness, EXAM: CT CHEST, ABDOMEN, AND PELVIS WITH CONTRAST TECHNIQUE: Multidetector CT imaging of the chest, abdomen and pelvis was performed following the standard protocol during bolus administration of intravenous contrast. CONTRAST:  140mL OMNIPAQUE IOHEXOL 300 MG/ML  SOLN COMPARISON:  CT Mar 02, 2012 FINDINGS: CT CHEST FINDINGS Cardiovascular: The aortic root is suboptimally assessed given cardiac pulsation artifact. Normal caliber aorta. No acute luminal irregularity of the aorta. No periaortic stranding or hemorrhage. Normal 3 vessel branching of the arch. Proximal great vessels opacify normally. Mild cardiomegaly. Small volume pericardial fluid is similar to prior and likely within physiologic normal. Central pulmonary arteries are poorly opacified but  normal caliber. No large filling defects are identified on this non tailored examination. Mediastinum/Nodes: No acute traumatic injury of the trachea. High attenuation material seen within the thoracic esophagus and proximal stomach. Correlate with high attenuation ingested material such as bismuth containing products. No esophageal thickening or extraluminal gas. No mediastinal, hilar or axillary adenopathy. Prior right axillary nodal dissection is noted. Lungs/Pleura: There is asymmetric volume loss in the right lung with mixed ground-glass and consolidative opacity throughout the posterolateral right lung with associated airways thickening and scattered secretions. Trace left effusion is seen. Small amount of patchy airspace disease and ground-glass opacities present the left lung base. Some dependent atelectatic changes are present as well. No pneumothorax. Musculoskeletal: Left seventh through tenth posterolateral minimally displaced rib fractures. Mild adjacent soft tissue contusion. No other acute osseous injury in the abdomen or pelvis. Postsurgical changes from right mastectomy and axillary nodal dissection. CT ABDOMEN PELVIS FINDINGS Hepatobiliary: No direct hepatic injury or perihepatic hematoma. No focal liver abnormality is seen. No gallstones, gallbladder wall thickening, or biliary dilatation. Pancreas: Unremarkable. No pancreatic ductal dilatation or surrounding inflammatory changes. Spleen: Trace posteroinferior subcapsular hematoma without clear site of laceration in the spleen. Adrenals/Urinary Tract: No adrenal hemorrhage or suspicious adrenal lesions. No direct renal injury or perirenal hemorrhage. No suspicious renal lesions, urolithiasis or hydronephrosis. Bladder is distended without direct bladder injury. Stomach/Bowel: Distal esophagus, stomach and duodenal sweep are unremarkable. No small bowel wall thickening or dilatation. No evidence of obstruction. A normal appendix is visualized.  Scattered colonic diverticula without focal pericolonic inflammation to suggest diverticulitis. Vascular/Lymphatic: Atherosclerotic plaque within the normal caliber aorta. No suspicious or enlarged lymph nodes  in the included lymphatic chains. Reproductive: Uterus is surgically absent. No concerning adnexal lesions. Other: Trace amount mesenteric contusion in the left upper quadrant adjacent the spleen and splenic flexure. No active contrast extravasation. Musculoskeletal: No acute osseous injury in the abdomen or pelvis. IMPRESSION: Traumatic Findings: 1. Left seventh through tenth minimally displaced rib fractures. Adjacent body wall contusion. 2. Trace left pleural effusion without pneumothorax. 3. AAST Grade I Splenic Injury: Subcapsular hematoma involving less than 10% of the surface area without visible laceration. 4. Small amount of mesenteric contusion in the left upper quadrant adjacent the spleen and splenic flexure. Nontraumatic Findings: 1. Postsurgical changes from prior mastectomy and right axillary dissection. 2. Mixed ground-glass and airspace opacity throughout the right lung and minimally in the left lung base. Findings are worrisome for an acute infectious process including potential atypical viral pneumonia. Additional consideration could include sequela of aspiration. Radiation related changes are considered less likely given distribution and appearance. 3.  Aortic Atherosclerosis (ICD10-I70.0). These results were called by telephone at the time of interpretation on 11/02/2019 at 4:17 am to provider Emeterio Reeve , who verbally acknowledged these results. Electronically Signed   By: Lovena Le M.D.   On: 11/02/2019 04:17    Anti-infectives: Anti-infectives (From admission, onward)   None       Assessment/Plan Fall Left 7-10 rib fx - patient has not been out of bed yet.  She has got to start mobilizing to see how she tolerates this.  PT/OT ordered yesterday to assist with  mobilization given pain with moving.  pulm toilet, IS, which is not in the room yet despite being ordered yesterday.  I have also ordered a bedside commode yesterday that is not in the room either to help the patient mobilize without limiting her to a bedpan. Grade 1 splenic contusion - no abdominal pain, hgb stable FEN - regular diet, SLIV, multimodal pain control VTE - lovenox ID - none needed   LOS: 1 day    Henreitta Cea , [facility] 11/03/2019, 9:47 AM Please see Amion for pager number during day hours 7:00am-4:30pm or 7:00am -11:30am on weekends

## 2019-11-03 NOTE — Plan of Care (Signed)
  Problem: Education: Goal: Knowledge of General Education information will improve Description: Including pain rating scale, medication(s)/side effects and non-pharmacologic comfort measures Outcome: Progressing   Problem: Health Behavior/Discharge Planning: Goal: Ability to manage health-related needs will improve Outcome: Progressing   Problem: Clinical Measurements: Goal: Will remain free from infection Outcome: Progressing Goal: Cardiovascular complication will be avoided Outcome: Progressing   Problem: Activity: Goal: Risk for activity intolerance will decrease Outcome: Progressing   Problem: Pain Managment: Goal: General experience of comfort will improve Outcome: Progressing   Problem: Safety: Goal: Ability to remain free from injury will improve Outcome: Progressing   Problem: Skin Integrity: Goal: Risk for impaired skin integrity will decrease Outcome: Progressing   

## 2019-11-03 NOTE — Evaluation (Signed)
Physical Therapy Evaluation Patient Details Name: Maureen Duncan MRN: [id number] DOB: 12-Jun-1951 Today's Date: 11/03/2019   History of Present Illness  Patient is a 69 y/o female admitted after fall down stairs with splenic laceration and rib fx's on L 8&9.  Clinical Impression  Patient presents with mobility decreased due to pain, decreased activity tolerance with pain controlled, but medication related nausea limiting mobility.  Feel she will benefit from skilled PT in the acute setting prior to d/c home with family support and follow up HHPT.  Currently not safe for d/c as unable to tolerate more than EOB mobility.  Will follow daily till ready for d/c.     Follow Up Recommendations Home health PT    Equipment Recommendations  Rolling walker with 5" wheels    Recommendations for Other Services       Precautions / Restrictions Precautions Precautions: Fall      Mobility  Bed Mobility Overal bed mobility: Needs Assistance Bed Mobility: Sit to Supine;Supine to Sit     Supine to sit: Mod assist;HOB elevated Sit to supine: Mod assist   General bed mobility comments: lifting help for trunk, assist for legs into bed  Transfers Overall transfer level: Needs assistance Equipment used: Rolling walker (2 wheeled) Transfers: Sit to/from Stand Sit to Stand: Mod assist;From elevated surface         General transfer comment: briefly up from stretcher to remove linen under her, not able to stand erect due to pain and nausea  Ambulation/Gait             General Gait Details: unable due to nausea  Stairs            Wheelchair Mobility    Modified Rankin (Stroke Patients Only)       Balance Overall balance assessment: Needs assistance Sitting-balance support: Feet supported Sitting balance-Leahy Scale: Fair Sitting balance - Comments: sat EOB about 15 minutes working through nausea symptoms leaning forward on walker       Standing balance comment: NT                              Pertinent Vitals/Pain Pain Assessment: 0-10 Pain Score: 6  Pain Location: L ribs with mobility Pain Descriptors / Indicators: Aching;Discomfort;Sore Pain Intervention(s): Monitored during session;Repositioned    Home Living Family/patient expects to be discharged to:: Private residence Living Arrangements: Alone Available Help at Discharge: Family;Available 24 hours/day Type of Home: House Home Access: Level entry     Home Layout: Two level Home Equipment: Cane - single point;Crutches Additional Comments: her home is two level, may stay with daughter if needed one level    Prior Function Level of Independence: Independent         Comments: working from home     Hand Dominance        Extremity/Trunk Assessment   Upper Extremity Assessment Upper Extremity Assessment: LUE deficits/detail LUE Deficits / Details: AAROM to 90 degrees, strength shoulder 3-/5    Lower Extremity Assessment Lower Extremity Assessment: LLE deficits/detail LLE Deficits / Details: AAROM WFL, strength 3+/5, but pain in ribs with significant resistance       Communication   Communication: No difficulties  Cognition Arousal/Alertness: Awake/alert Behavior During Therapy: WFL for tasks assessed/performed Overall Cognitive Status: Within Functional Limits for tasks assessed  General Comments General comments (skin integrity, edema, etc.): RN gave nausea medications during session    Exercises     Assessment/Plan    PT Assessment Patient needs continued PT services  PT Problem List Decreased strength;Decreased activity tolerance;Decreased mobility;Pain;Decreased balance;Decreased range of motion       PT Treatment Interventions DME instruction;Stair training;Therapeutic activities;Balance training;Patient/family education;Therapeutic exercise;Functional mobility training;Gait training    PT  Goals (Current goals can be found in the Care Plan section)  Acute Rehab PT Goals Patient Stated Goal: to go home with family help PT Goal Formulation: With patient Time For Goal Achievement: 11/10/19 Potential to Achieve Goals: Good    Frequency Min 5X/week   Barriers to discharge        Co-evaluation               AM-PAC PT "6 Clicks" Mobility  Outcome Measure Help needed turning from your back to your side while in a flat bed without using bedrails?: A Lot Help needed moving from lying on your back to sitting on the side of a flat bed without using bedrails?: A Lot Help needed moving to and from a bed to a chair (including a wheelchair)?: A Lot Help needed standing up from a chair using your arms (e.g., wheelchair or bedside chair)?: A Lot Help needed to walk in hospital room?: Total Help needed climbing 3-5 steps with a railing? : Total 6 Click Score: 10    End of Session   Activity Tolerance: Patient limited by pain;Other (comment)(nausea) Patient left: in bed;with call bell/phone within reach   PT Visit Diagnosis: Other abnormalities of gait and mobility (R26.89);Pain Pain - Right/Left: Left Pain - part of body: Arm(side)    Time: [id number] PT Time Calculation (min) (ACUTE ONLY): 40 min   Charges:   PT Evaluation $PT Eval Moderate Complexity: 1 Mod PT Treatments $Therapeutic Activity: 23-37 mins        Magda Kiel, [place] Acute Rehabilitation Services [phone] 11/03/2019   Reginia Naas 11/03/2019, 1:49 PM

## 2019-11-03 NOTE — ED Notes (Signed)
Pt given breakfast tray

## 2019-11-03 NOTE — ED Notes (Signed)
Physical Therapy at bedside.

## 2019-11-04 ENCOUNTER — Encounter (HOSPITAL_COMMUNITY): Payer: Self-pay

## 2019-11-04 MED ORDER — PNEUMOCOCCAL VAC POLYVALENT 25 MCG/0.5ML IJ INJ
0.5000 mL | INJECTION | INTRAMUSCULAR | Status: AC
  Administered 2019-11-05: 0.5 mL via INTRAMUSCULAR
  Filled 2019-11-04: qty 0.5

## 2019-11-04 MED ORDER — DOCUSATE SODIUM 100 MG PO CAPS
100.0000 mg | ORAL_CAPSULE | Freq: Two times a day (BID) | ORAL | Status: DC
  Administered 2019-11-04: 100 mg via ORAL
  Filled 2019-11-04 (×3): qty 1

## 2019-11-04 NOTE — Plan of Care (Signed)

## 2019-11-04 NOTE — Evaluation (Signed)
Occupational Therapy Evaluation Patient Details Name: Maureen Duncan MRN: [id number] DOB: 06-18-1951 Today's Date: 11/04/2019    History of Present Illness Patient is a 69 y/o female admitted after fall down stairs with splenic laceration and rib fx's on L 8&9.   Clinical Impression   Pt admitted with the above diagnoses and presents with below problem list. PTA pt was independent with ADLs. Pt is currently supervision level for most ADLs and functional mobility. Slow, guarded movements due to R flank pain. ADL education completed. Pt states she plans to sponge bath at home. All OT education completed. No further OT needs indicated. OT signing off.     Follow Up Recommendations  No OT follow up;Supervision - Intermittent    Equipment Recommendations  3 in 1 bedside commode    Recommendations for Other Services       Precautions / Restrictions Precautions Precautions: Fall Restrictions Weight Bearing Restrictions: No      Mobility Bed Mobility Overal bed mobility: Needs Assistance Bed Mobility: Rolling;Sidelying to Sit Rolling: Supervision Sidelying to sit: Supervision       General bed mobility comments: up in chair  Transfers Overall transfer level: Needs assistance Equipment used: None Transfers: Sit to/from Stand Sit to Stand: Supervision         General transfer comment: pt walking in room with no AD upon arrival of OT. sat into recliner at supervision level. Guarded and cautious due to pain    Balance Overall balance assessment: Needs assistance Sitting-balance support: Feet supported Sitting balance-Leahy Scale: Good Sitting balance - Comments: eOB and at [facility] without assist     Standing balance-Leahy Scale: Good                             ADL either performed or assessed with clinical judgement   ADL Overall ADL's : Needs assistance/impaired Eating/Feeding: Set up;Sitting   Grooming: Set up;Supervision/safety;Sitting;Standing    Upper Body Bathing: Set up;Sitting;Supervision/ safety   Lower Body Bathing: Supervison/ safety;Sit to/from stand;Modified independent   Upper Body Dressing : Set up;Sitting;Supervision/safety   Lower Body Dressing: Sit to/from stand;Modified independent;Supervision/safety   Toilet Transfer: Supervision/safety;Ambulation   Toileting- Clothing Manipulation and Hygiene: Supervision/safety;Sit to/from stand   Tub/ Shower Transfer: Min guard;Ambulation Tub/Shower Transfer Details (indicate cue type and reason): pt plans to sponge bath at sink  Functional mobility during ADLs: Supervision/safety General ADL Comments: Discussed UB/LB ADL technique incuding AE if needed.      Vision         Perception     Praxis      Pertinent Vitals/Pain Pain Assessment: Faces Pain Score: 5  Faces Pain Scale: Hurts even more Pain Location: L ribs with mobility Pain Descriptors / Indicators: Aching;Discomfort;Sore Pain Intervention(s): Limited activity within patient's tolerance;Monitored during session;Repositioned     Hand Dominance     Extremity/Trunk Assessment Upper Extremity Assessment Upper Extremity Assessment: RUE deficits/detail RUE Deficits / Details: shoulder ROM guarded due to R flank pain. able to feed self and use RUE for basic functional tasks. Tends to hold arm in protective position 2/2 R flank pain. RUE: Unable to fully assess due to pain   Lower Extremity Assessment Lower Extremity Assessment: Defer to PT evaluation   Cervical / Trunk Assessment Cervical / Trunk Assessment: Normal   Communication Communication Communication: No difficulties   Cognition Arousal/Alertness: Awake/alert Behavior During Therapy: WFL for tasks assessed/performed Overall Cognitive Status: Within Functional Limits for tasks assessed  General Comments       Exercises     Shoulder Instructions      Home Living Family/patient  expects to be discharged to:: Private residence Living Arrangements: Alone( Temporaily living with Daughter) Available Help at Discharge: Family;Available 24 hours/day Type of Home: House Home Access: Level entry     Home Layout: Two level Alternate Level Stairs-Number of Steps: flight Alternate Level Stairs-Rails: Left Bathroom Shower/Tub: Teacher, early years/pre: Standard     Home Equipment: Cane - single point;Crutches   Additional Comments: her home is two level, may stay with daughter if needed one level      Prior Functioning/Environment Level of Independence: Independent        Comments: working from home        OT Problem List:        OT Treatment/Interventions:      OT Goals(Current goals can be found in the care plan section) Acute Rehab OT Goals Patient Stated Goal: to go home with family help  OT Frequency:     Barriers to D/C:            Co-evaluation              AM-PAC OT "6 Clicks" Daily Activity     Outcome Measure Help from another person eating meals?: None Help from another person taking care of personal grooming?: None Help from another person toileting, which includes using toliet, bedpan, or urinal?: None Help from another person bathing (including washing, rinsing, drying)?: None Help from another person to put on and taking off regular upper body clothing?: None Help from another person to put on and taking off regular lower body clothing?: None 6 Click Score: 24   End of Session    Activity Tolerance: Patient tolerated treatment well;Patient limited by pain Patient left: in chair;with call bell/phone within reach  OT Visit Diagnosis: Unsteadiness on feet (R26.81);Pain Pain - Right/Left: Right Pain - part of body: (flank)                Time: [id number] OT Time Calculation (min): 12 min Charges:  OT General Charges $OT Visit: 1 Visit OT Evaluation $OT Eval Low Complexity: [place], OT Acute  Rehabilitation Services Pager: [phone] Office: [phone]   Hortencia Pilar 11/04/2019, 11:50 AM

## 2019-11-04 NOTE — Progress Notes (Signed)
Physical Therapy Treatment Patient Details Name: Maureen Duncan MRN: [id number] DOB: [birth year] Today's Date: 11/04/2019    History of Present Illness Patient is a 69 y/o female admitted after fall down stairs with splenic laceration and rib fx's on L 8&9.    PT Comments    Pt slow moving and cautious due to pain. Pt able to get up and to bathroom to brush teeth and wash face prior to gait. Pt will stay with daughter in single story home without stairs at D/C. Pt educated for transfers, gait, functional mobility, dressing, IS use (achieving 1100), splinting with coughing and able to verbalize understanding of all. Pt appropriate for D/C home.     Follow Up Recommendations  Home health PT     Equipment Recommendations  None recommended by PT    Recommendations for Other Services       Precautions / Restrictions Precautions Precautions: Fall Restrictions Weight Bearing Restrictions: No    Mobility  Bed Mobility Overal bed mobility: Needs Assistance Bed Mobility: Rolling;Sidelying to Sit Rolling: Supervision Sidelying to sit: Supervision       General bed mobility comments: cues for sequence, bed flat, no rail and increased time  Transfers Overall transfer level: Needs assistance   Transfers: Sit to/from Stand Sit to Stand: Supervision         General transfer comment: cues for hand placement. pt stood from bed, BSC x 4 trials. Guarded and cautious due to pain  Ambulation/Gait Ambulation/Gait assistance: Min guard Gait Distance (Feet): 500 Feet Assistive device: None Gait Pattern/deviations: Step-through pattern;Decreased stride length   Gait velocity interpretation: 1.31 - 2.62 ft/sec, indicative of limited community ambulator General Gait Details: pt maintaining holding folded sheets under LUE the entire time and did not wish to initiate arm swing. Educated on importance of movement and only splinting for coughing   Stairs             Wheelchair  Mobility    Modified Rankin (Stroke Patients Only)       Balance Overall balance assessment: Needs assistance   Sitting balance-Leahy Scale: Good Sitting balance - Comments: eOB and at [facility] without assist     Standing balance-Leahy Scale: Good                              Cognition Arousal/Alertness: Awake/alert Behavior During Therapy: WFL for tasks assessed/performed Overall Cognitive Status: Within Functional Limits for tasks assessed                                        Exercises      General Comments        Pertinent Vitals/Pain Pain Score: 5  Pain Location: L ribs with mobility Pain Descriptors / Indicators: Aching;Discomfort;Sore Pain Intervention(s): Limited activity within patient's tolerance;Premedicated before session;Repositioned;Monitored during session    Maureen Duncan expects to be discharged to:: Private residence Living Arrangements: Alone(Temporaily living with Daughter)                  Prior Function            PT Goals (current goals can now be found in the care plan section) Progress towards PT goals: Progressing toward goals    Frequency    Min 5X/week      PT Plan Current plan remains appropriate  Co-evaluation              AM-PAC PT "6 Clicks" Mobility   Outcome Measure  Help needed turning from your back to your side while in a flat bed without using bedrails?: A Little Help needed moving from lying on your back to sitting on the side of a flat bed without using bedrails?: A Little Help needed moving to and from a bed to a chair (including a wheelchair)?: A Little Help needed standing up from a chair using your arms (e.g., wheelchair or bedside chair)?: A Little Help needed to walk in hospital room?: A Little Help needed climbing 3-5 steps with a railing? : A Little 6 Click Score: 18    End of Session   Activity Tolerance: Patient tolerated treatment  well Patient left: with call bell/phone within reach;Other (comment)(on BSC at sink for bathing) Nurse Communication: Mobility status PT Visit Diagnosis: Other abnormalities of gait and mobility (R26.89)     Time: [id number] PT Time Calculation (min) (ACUTE ONLY): 48 min  Charges:  $Gait Training: 8-22 mins $Therapeutic Activity: 23-37 mins                     Maureen Duncan, PT Acute Rehabilitation Services Pager: [phone] Office: [place] 11/04/2019, 10:50 AM

## 2019-11-04 NOTE — Discharge Summary (Signed)
Patient ID: Maureen Duncan [id number] 04/20/1951 69 y.o.  Admit date: 11/01/2019 Discharge date: 11/05/2019  Admitting Diagnosis: Fall  Left rib fractures 7-10 Grade 1 Splenic laceration  Discharge Diagnosis Patient Active Problem List   Diagnosis Date Noted  . Rib fractures 11/03/2019  . Multiple trauma 11/02/2019  . Arthritis of knee 04/07/2018  . Unilateral primary osteoarthritis, left knee   . Prolapse of female bladder, acquired 05/08/2015  . Right elbow pain 07/25/2014  . Hyperlipidemia 04/28/2014  . Allergic rhinitis 04/28/2014  . Breast cancer, right. T2, N1. Mastectomy 01/30/2012. 01/09/2012  Fall  Left rib fractures 7-10 Grade 1 Splenic laceration  Consultants None  Reason for Admission: Alcohol Intoxication Associated symptoms include abdominal pain and chest pain. Pertinent negatives include no coughing.  This is a 69 year old female who fell reportedly walking downstairs.  She was brought to [facility] long hospital by EMS.  This was a witnessed fall.  She arrived complaining of left chest pain and abdominal pain.  She admitted to drinking several mixed drinks this evening but reports she does not drink regularly.  She did have one episode of emesis.  She denied loss of consciousness.  She denies neck pain.  She is otherwise without complaints.  She denies shortness of breath.  Procedures None  Hospital Course:  The patient was admitted and unfortunately was in the ED for the first 2 hospital days.  Mobilization was not started much until she arrived on the floor.  She got an IS and pulled around 1000.  Therapies then worked with her and PT was recommended along with some equipment.  She was felt stable on HD 4 for discharge home as her pain was controlled, she was tolerating a diet, and voiding well.    Physical Exam: Gen: NAD Heart: regular Lungs: CTAB, moving good air, nonlabored. Left chest wall pain as expected Abd: soft, NT, ND, +BS Ext: MAE,  NVI  Allergies as of 11/05/2019      Reactions   Meloxicam    Pt states "gives me a weird feeling"  "makes my heart race and makes me anxious"    Adhesive [tape] Rash      Medication List    STOP taking these medications   meloxicam 7.5 MG tablet Commonly known as: MOBIC     TAKE these medications   acetaminophen 500 MG tablet Commonly known as: TYLENOL Take 2 tablets (1,000 mg total) by mouth every 6 (six) hours. What changed:   when to take this  reasons to take this   ALKA-SELTZER PLUS NIGHT COLD PO Take 1 tablet by mouth at bedtime as needed (cold symptoms/sleep).   amLODipine-benazepril 5-20 MG capsule Commonly known as: LOTREL TAKE 1 CAPSULE BY MOUTH EVERY DAY What changed: how much to take   amoxicillin 500 MG tablet Commonly known as: AMOXIL TAKE 4 TABLETS BY MOUTH 1 HOUR PRIOR TO DENTAL PROCEDURE What changed:   how much to take  how to take this  when to take this   atorvastatin 40 MG tablet Commonly known as: LIPITOR Take 40 mg by mouth daily.   diphenhydrAMINE 25 MG tablet Commonly known as: BENADRYL Take 50 mg by mouth daily as needed for itching or allergies.   diphenhydramine-acetaminophen 25-500 MG Tabs tablet Commonly known as: TYLENOL PM Take 1 tablet by mouth at bedtime as needed (sleep).   docusate sodium 100 MG capsule Commonly known as: COLACE Take 1 capsule (100 mg total) by mouth 2 (two) times daily.  What changed:   how much to take  when to take this  reasons to take this   fluticasone 50 MCG/ACT nasal spray Commonly known as: FLONASE Place 2 sprays into both nostrils daily as needed for allergies or rhinitis.   magnesium citrate Soln Take 1 Bottle by mouth every 30 (thirty) days.   methocarbamol 500 MG tablet Commonly known as: ROBAXIN Take 2 tablets (1,000 mg total) by mouth every 8 (eight) hours. What changed:   how much to take  when to take this  reasons to take this   oxyCODONE 5 MG immediate release  tablet Commonly known as: Roxicodone Take 1 tablet (5 mg total) by mouth every 4 (four) hours as needed for severe pain.   traMADol 50 MG tablet Commonly known as: ULTRAM Take 1 tablet (50 mg total) by mouth every 6 (six) hours as needed (mild pain).   triamcinolone cream 0.1 % Commonly known as: KENALOG Apply 1 application topically daily as needed (rash).   Viactiv Calcium Plus D 650-12.5-40 MG-MCG-MCG Chew Generic drug: Calcium-Vitamin D-Vitamin K Chew 1 tablet by mouth daily.   Vitamin D (Ergocalciferol) 1.25 MG (50000 UT) Caps capsule Commonly known as: DRISDOL Take 1 capsule (50,000 Units total) by mouth 2 (two) times a week.            Durable Medical Equipment  (From admission, onward)         Start     Ordered   11/04/19 1511  For home use only DME 3 n 1  Once     11/04/19 1510   11/04/19 1501  For home use only DME Bedside commode  Once    Question:  Patient needs a bedside commode to treat with the following condition  Answer:  Multiple rib fractures   11/04/19 1501   11/04/19 0756  For home use only DME Walker rolling  Once    Question Answer Comment  Walker: With [place] Whe[employer]   Patient needs a walker to treat with the following condition Rib fracture      11/04/19 0756           Follow-up Information    Deland Pretty, MD Follow up.   Specialty: Internal Medicine Why: as needed for rib fractures Contact information: [place] [address] [phone]           Signed: Saverio Danker, [facility] 11/05/2019, 7:41 AM Please see Amion for pager number during day hours 7:00am-4:30pm

## 2019-11-04 NOTE — Progress Notes (Signed)
Patient ID: Maureen Duncan, female   DOB: Nov 20, 1950, 69 y.o.   MRN: [id number]       Subjective: Patient feels much better today now that she is on the floor and out of the ED.  Getting pain meds more consistently and able to get a wash down.  Worked with PT yesterday who is recommending [facility].  Eating ok.  No BM yet.  Pulls 1000 on IS  ROS: See above, otherwise other systems negative  Objective: Vital signs in last 24 hours: Temp:  [98.3 F (36.8 C)] 98.3 F (36.8 C) (01/07 0340) Pulse Rate:  [65-92] 65 (01/07 0340) Resp:  [15-22] 18 (01/06 1611) BP: (97-153)/(70-97) 118/79 (01/07 0340) SpO2:  [93 %-98 %] 95 % (01/07 0340) Last BM Date: 11/01/19  Intake/Output from previous day: 01/06 0701 - 01/07 0700 In: 700 [I.V.:700] Out: 800 [Urine:800] Intake/Output this shift: No intake/output data recorded.  PE: Gen: NAD Heart: regular Lungs: CTAB, moving good air, nonlabored.  Left chest wall pain as expected Abd: soft, NT, ND, +BS Ext: MAE, NVI  Lab Results:  Recent Labs    11/02/19 0122 11/03/19 0625  WBC 14.0* 8.3  HGB 11.0* 11.9*  HCT 35.0* 38.8  PLT 195 220   BMET Recent Labs    11/02/19 0122 11/03/19 0625  NA 141 139  K 2.8* 4.4  CL 110 105  CO2 19* 23  GLUCOSE 114* 99  BUN 8 6*  CREATININE 0.50 0.59  CALCIUM 7.7* 8.9   PT/INR No results for input(s): LABPROT, INR in the last 72 hours. CMP     Component Value Date/Time   NA 139 11/03/2019 0625   NA 141 07/28/2017 0911   NA 142 01/27/2017 0849   K 4.4 11/03/2019 0625   K 3.8 07/28/2017 0911   K 4.3 01/27/2017 0849   CL 105 11/03/2019 0625   CL 105 07/28/2017 0911   CO2 23 11/03/2019 0625   CO2 29 07/28/2017 0911   CO2 29 01/27/2017 0849   GLUCOSE 99 11/03/2019 0625   GLUCOSE 102 07/28/2017 0911   BUN 6 (L) 11/03/2019 0625   BUN 13 07/28/2017 0911   BUN 16.9 01/27/2017 0849   CREATININE 0.59 11/03/2019 0625   CREATININE 0.76 07/09/2019 0810   CREATININE 0.8 07/28/2017 0911   CREATININE  0.8 01/27/2017 0849   CALCIUM 8.9 11/03/2019 0625   CALCIUM 10.0 07/28/2017 0911   CALCIUM 10.5 (H) 01/27/2017 0849   PROT 5.4 (L) 11/02/2019 0122   PROT 7.6 07/28/2017 0911   PROT 7.2 01/27/2017 0849   ALBUMIN 3.1 (L) 11/02/2019 0122   ALBUMIN 3.9 07/28/2017 0911   ALBUMIN 3.8 01/27/2017 0849   AST 24 11/02/2019 0122   AST 17 07/09/2019 0810   AST 20 01/27/2017 0849   ALT 19 11/02/2019 0122   ALT 14 07/09/2019 0810   ALT 19 07/28/2017 0911   ALT 16 01/27/2017 0849   ALKPHOS 43 11/02/2019 0122   ALKPHOS 64 07/28/2017 0911   ALKPHOS 67 01/27/2017 0849   BILITOT 0.3 11/02/2019 0122   BILITOT 0.5 07/09/2019 0810   BILITOT 0.53 01/27/2017 0849   GFRNONAA >60 11/03/2019 0625   GFRNONAA >60 07/09/2019 0810   GFRAA >60 11/03/2019 0625   GFRAA >60 07/09/2019 0810   Lipase     Component Value Date/Time   LIPASE 27 11/02/2019 0122       Studies/Results: No results found.  Anti-infectives: Anti-infectives (From admission, onward)   None  Assessment/Plan Fall Left 7-10 rib fx - cont mobilization and pain control.  Needs one more day with mobilization with therapies and pain control.  We discussed the expectation that she will be seen first thing tomorrow morning with the plan for DC home early tomorrow morning.  She is agreeable Grade 1 splenic contusion - no abdominal pain, hgb stable FEN - regular diet, SLIV, multimodal pain control VTE - lovenox ID - none needed   LOS: 2 days    Henreitta Cea , [facility] Surgery 11/04/2019, 8:35 AM Please see Amion for pager number during day hours 7:00am-4:30pm or 7:00am -11:30am on weekends

## 2019-11-05 MED ORDER — ACETAMINOPHEN 500 MG PO TABS: 1000.0000 mg | ORAL_TABLET | Freq: Four times a day (QID) | ORAL | 0 refills | Status: AC

## 2019-11-05 MED ORDER — OXYCODONE HCL 5 MG PO TABS: 5.0000 mg | ORAL_TABLET | ORAL | 0 refills | Status: DC | PRN

## 2019-11-05 MED ORDER — METHOCARBAMOL 500 MG PO TABS: 1000.0000 mg | ORAL_TABLET | Freq: Three times a day (TID) | ORAL | 0 refills | Status: DC

## 2019-11-05 MED ORDER — TRAMADOL HCL 50 MG PO TABS: 50.0000 mg | ORAL_TABLET | Freq: Four times a day (QID) | ORAL | 0 refills | Status: DC | PRN

## 2019-11-05 NOTE — Plan of Care (Signed)
  Problem: Skin Integrity: Goal: Risk for impaired skin integrity will decrease Outcome: Progressing   Problem: Safety: Goal: Ability to remain free from injury will improve Outcome: Progressing   Problem: Pain Managment: Goal: General experience of comfort will improve Outcome: Progressing   

## 2019-11-05 NOTE — Progress Notes (Signed)
Pt made aware of d/c orders and states that daughter will be picking her up after work sometime after 1300. Will continue to monitor. [place], RN 11/05/2019 10:59 AM

## 2019-11-05 NOTE — Progress Notes (Signed)
RN reviewed AVS with pt, IV removed and belongings gathered. Pt dressed and taken downstairs to be d/c. Pt refused to take home DME (rolling walker and 3-n-1). RN educated pt and pt still refused saying "I dont need that, I got that with my knee surgery." unused DME returned to front nurses station. All questions answered to [place], RN 11/05/2019 4:13 PM

## 2019-11-05 NOTE — Plan of Care (Signed)
  Problem: Education: Goal: Knowledge of General Education information will improve Description: Including pain rating scale, medication(s)/side effects and non-pharmacologic comfort measures 11/05/2019 1058 by Willia Craze, RN Outcome: Adequate for Discharge 11/05/2019 1058 by Willia Craze, RN Outcome: Progressing   Problem: Health Behavior/Discharge Planning: Goal: Ability to manage health-related needs will improve Outcome: Adequate for Discharge   Problem: Clinical Measurements: Goal: Ability to maintain clinical measurements within normal limits will improve 11/05/2019 1058 by Willia Craze, RN Outcome: Adequate for Discharge 11/05/2019 1058 by Willia Craze, RN Outcome: Progressing Goal: Will remain free from infection Outcome: Adequate for Discharge Goal: Diagnostic test results will improve Outcome: Adequate for Discharge Goal: Respiratory complications will improve Outcome: Adequate for Discharge Goal: Cardiovascular complication will be avoided Outcome: Adequate for Discharge   Problem: Activity: Goal: Risk for activity intolerance will decrease 11/05/2019 1058 by Willia Craze, RN Outcome: Adequate for Discharge 11/05/2019 1058 by Willia Craze, RN Outcome: Progressing   Problem: Nutrition: Goal: Adequate nutrition will be maintained 11/05/2019 1058 by Willia Craze, RN Outcome: Adequate for Discharge 11/05/2019 1058 by Willia Craze, RN Outcome: Progressing   Problem: Coping: Goal: Level of anxiety will decrease 11/05/2019 1058 by Willia Craze, RN Outcome: Adequate for Discharge 11/05/2019 1058 by Willia Craze, RN Outcome: Progressing   Problem: Elimination: Goal: Will not experience complications related to bowel motility Outcome: Adequate for Discharge Goal: Will not experience complications related to urinary retention Outcome: Adequate for Discharge   Problem: Pain Managment: Goal: General experience of comfort will improve Outcome: Adequate  for Discharge   Problem: Safety: Goal: Ability to remain free from injury will improve Outcome: Adequate for Discharge   Problem: Skin Integrity: Goal: Risk for impaired skin integrity will decrease Outcome: Adequate for Discharge

## 2019-11-05 NOTE — Discharge Instructions (Signed)
Rib Fracture  A rib fracture is a break or crack in one of the bones of the ribs. The ribs are like a cage that goes around your upper chest. A broken or cracked rib is often painful, but most do not cause other problems. Most rib fractures usually heal on their own in 1-3 months. Follow these instructions at home: Managing pain, stiffness, and swelling  If directed, apply ice to the injured area. ? Put ice in a plastic bag. ? Place a towel between your skin and the bag. ? Leave the ice on for 20 minutes, 2-3 times a day.  Take over-the-counter and prescription medicines only as told by your doctor. Activity  Avoid activities that cause pain to the injured area. Protect your injured area.  Slowly increase activity as told by your doctor. General instructions  Do deep breathing as told by your doctor. You may be told to: ? Take deep breaths many times a day. ? Cough many times a day while hugging a pillow. ? Use a device (incentive spirometer) to do deep breathing many times a day.  Drink enough fluid to keep your pee (urine) clear or pale yellow.  Do not wear a rib belt or binder. These do not allow you to breathe deeply.  Keep all follow-up visits as told by your doctor. This is important. Contact a doctor if:  You have a fever. Get help right away if:  You have trouble breathing.  You are short of breath.  You cannot stop coughing.  You cough up thick or bloody spit (sputum).  You feel sick to your stomach (nauseous), throw up (vomit), or have belly (abdominal) pain.  Your pain gets worse and medicine does not help. Summary  A rib fracture is a break or crack in one of the bones of the ribs.  Apply ice to the injured area and take medicines for pain as told by your doctor.  Take deep breaths and cough many times a day. Hug a pillow every time you cough. This information is not intended to replace advice given to you by your health care provider. Make sure you  discuss any questions you have with your health care provider. Document Revised: 09/26/2017 Document Reviewed: 01/14/2017 Elsevier Patient Education  [place].  ........Marland Kitchen   Managing Your Pain After Surgery Without Opioids    Thank you for participating in our program to help patients manage their pain after surgery without opioids. This is part of our effort to provide you with the best care possible, without exposing you or your family to the risk that opioids pose.  What pain can I expect after surgery? You can expect to have some pain after surgery. This is normal. The pain is typically worse the day after surgery, and quickly begins to get better. Many studies have found that many patients are able to manage their pain after surgery with Over-the-Counter (OTC) medications such as Tylenol and Motrin. If you have a condition that does not allow you to take Tylenol or Motrin, notify your surgical team.  How will I manage my pain? The best strategy for controlling your pain after surgery is around the clock pain control with Tylenol (acetaminophen) and Motrin (ibuprofen or Advil). Alternating these medications with each other allows you to maximize your pain control. In addition to Tylenol and Motrin, you can use heating pads or ice packs on your incisions to help reduce your pain.  How will I alternate your regular strength  over-the-counter pain medication? You will take a dose of pain medication every three hours. ; Start by taking 650 mg of Tylenol (2 pills of 325 mg) ; 3 hours later take 600 mg of Motrin (3 pills of 200 mg) ; 3 hours after taking the Motrin take 650 mg of Tylenol ; 3 hours after that take 600 mg of Motrin.   - 1 -  See example - if your first dose of Tylenol is at 12:00 PM   12:00 PM Tylenol 650 mg (2 pills of 325 mg)  3:00 PM Motrin 600 mg (3 pills of 200 mg)  6:00 PM Tylenol 650 mg (2 pills of 325 mg)  9:00 PM Motrin 600 mg (3 pills of 200 mg)    Continue alternating every 3 hours   We recommend that you follow this schedule around-the-clock for at least 3 days after surgery, or until you feel that it is no longer needed. Use the table on the last page of this handout to keep track of the medications you are taking. Important: Do not take more than 3000mg  of Tylenol or 3200mg  of Motrin in a 24-hour period. Do not take ibuprofen/Motrin if you have a history of bleeding stomach ulcers, severe kidney disease, &/or actively taking a blood thinner  What if I still have pain? If you have pain that is not controlled with the over-the-counter pain medications (Tylenol and Motrin or Advil) you might have what we call "breakthrough" pain. You will receive a prescription for a small amount of an opioid pain medication such as Oxycodone, Tramadol, or Tylenol with Codeine. Use these opioid pills in the first 24 hours after surgery if you have breakthrough pain. Do not take more than 1 pill every 4-6 hours.  If you still have uncontrolled pain after using all opioid pills, don't hesitate to call our staff using the number provided. We will help make sure you are managing your pain in the best way possible, and if necessary, we can provide a prescription for additional pain medication.   Day 1    Time  Name of Medication Number of pills taken  Amount of Acetaminophen  Pain Level   Comments  AM PM       AM PM       AM PM       AM PM       AM PM       AM PM       AM PM       AM PM       Total Daily amount of Acetaminophen Do not take more than  3,000 mg per day      Day 2    Time  Name of Medication Number of pills taken  Amount of Acetaminophen  Pain Level   Comments  AM PM       AM PM       AM PM       AM PM       AM PM       AM PM       AM PM       AM PM       Total Daily amount of Acetaminophen Do not take more than  3,000 mg per day      Day 3    Time  Name of Medication Number of pills taken  Amount of  Acetaminophen  Pain Level   Comments  AM PM  AM PM       AM PM       AM PM          AM PM       AM PM       AM PM       AM PM       Total Daily amount of Acetaminophen Do not take more than  3,000 mg per day      Day 4    Time  Name of Medication Number of pills taken  Amount of Acetaminophen  Pain Level   Comments  AM PM       AM PM       AM PM       AM PM       AM PM       AM PM       AM PM       AM PM       Total Daily amount of Acetaminophen Do not take more than  3,000 mg per day      Day 5    Time  Name of Medication Number of pills taken  Amount of Acetaminophen  Pain Level   Comments  AM PM       AM PM       AM PM       AM PM       AM PM       AM PM       AM PM       AM PM       Total Daily amount of Acetaminophen Do not take more than  3,000 mg per day       Day 6    Time  Name of Medication Number of pills taken  Amount of Acetaminophen  Pain Level  Comments  AM PM       AM PM       AM PM       AM PM       AM PM       AM PM       AM PM       AM PM       Total Daily amount of Acetaminophen Do not take more than  3,000 mg per day      Day 7    Time  Name of Medication Number of pills taken  Amount of Acetaminophen  Pain Level   Comments  AM PM       AM PM       AM PM       AM PM       AM PM       AM PM       AM PM       AM PM       Total Daily amount of Acetaminophen Do not take more than  3,000 mg per day        For additional information about how and where to safely dispose of unused opioid medications - [employer]  Disclaimer: This document contains information and/or instructional materials adapted from [place] for the typical patient with your condition. It does not replace medical advice from your health care provider because your experience may differ from that of the typical patient. Talk to your health care provider if you have any questions about  this document, your condition or your treatment plan. Adapted from [place]

## 2019-11-05 NOTE — TOC Transition Note (Signed)
Transition of Care [facility]) - CM/SW Discharge Note   Patient Details  Name: Maureen Duncan MRN: [id number] Date of Birth: 07/11/1951  [facility] CM/SW Contact:  Ella Bodo, RN Phone Number: 11/05/2019, 9:52 AM   Clinical Narrative:  Patient is a 69 y/o female admitted after fall down stairs with splenic laceration and rib fx's on L 8&9. PTA, pt independent of ADLS, lives at home alone.  She plans to dc to her daughter's home, who can assist with care.  PT recommending [facility] follow up, but pt politely declines HH follow up at this time.  She agrees to DME as recommended, which has been delivered to her room.    SBIRT completed; pt admits to ETOH use, but states it is not a problem for her.  She declines SA resources at this time.           Final next level of care: Home/Self Care Barriers to Discharge: Barriers Resolved   Patient Goals and CMS Choice Patient states their goals for this hospitalization and ongoing recovery are:: to get back home and feel better CMS Medicare.gov Compare Post Acute Care list provided to:: Patient Choice offered to / list presented to : Patient                        Discharge Plan and Services   Discharge Planning Services: CM Consult Post Acute Care Choice: Home Health          DME Arranged: 3-N-1, Walker rolling DME Agency: [employer] Date DME Agency Contacted: 11/04/19 Time DME Agency Contacted: 26 Representative spoke with at DME Agency: [place]: Refused HH          Social Determinants of Health ([place]) Interventions     Readmission Risk Interventions Readmission Risk Prevention Plan 11/05/2019  Post Dischage Appt Complete  Medication Screening Complete  Transportation Screening Complete  Some recent data might be hidden   Reinaldo Raddle, RN, BSN  Trauma/Neuro ICU Case Manager [phone]

## 2019-11-24 DIAGNOSIS — S2242XA Multiple fractures of ribs, left side, initial encounter for closed fracture: Secondary | ICD-10-CM | POA: Diagnosis not present

## 2019-11-25 ENCOUNTER — Encounter: Payer: Self-pay | Admitting: Hematology & Oncology

## 2019-11-29 DIAGNOSIS — M722 Plantar fascial fibromatosis: Secondary | ICD-10-CM | POA: Diagnosis not present

## 2019-12-01 DIAGNOSIS — Z23 Encounter for immunization: Secondary | ICD-10-CM | POA: Diagnosis not present

## 2019-12-14 ENCOUNTER — Other Ambulatory Visit (INDEPENDENT_AMBULATORY_CARE_PROVIDER_SITE_OTHER): Payer: Self-pay | Admitting: Surgical

## 2019-12-14 NOTE — Telephone Encounter (Signed)
Ok to rf? 

## 2019-12-22 DIAGNOSIS — Z23 Encounter for immunization: Secondary | ICD-10-CM | POA: Diagnosis not present

## 2020-01-07 ENCOUNTER — Inpatient Hospital Stay: Payer: Federal, State, Local not specified - PPO

## 2020-01-07 ENCOUNTER — Encounter: Payer: Self-pay | Admitting: Hematology & Oncology

## 2020-01-07 ENCOUNTER — Inpatient Hospital Stay: Payer: Federal, State, Local not specified - PPO | Attending: Hematology & Oncology | Admitting: Hematology & Oncology

## 2020-01-07 ENCOUNTER — Other Ambulatory Visit: Payer: Self-pay

## 2020-01-07 VITALS — BP 146/102 | HR 90 | Temp 97.5°F | Resp 18 | Wt 169.0 lb

## 2020-01-07 DIAGNOSIS — Z853 Personal history of malignant neoplasm of breast: Secondary | ICD-10-CM | POA: Diagnosis not present

## 2020-01-07 DIAGNOSIS — Z79899 Other long term (current) drug therapy: Secondary | ICD-10-CM | POA: Insufficient documentation

## 2020-01-07 DIAGNOSIS — Z9221 Personal history of antineoplastic chemotherapy: Secondary | ICD-10-CM | POA: Diagnosis not present

## 2020-01-07 DIAGNOSIS — Z7982 Long term (current) use of aspirin: Secondary | ICD-10-CM | POA: Diagnosis not present

## 2020-01-07 DIAGNOSIS — C50011 Malignant neoplasm of nipple and areola, right female breast: Secondary | ICD-10-CM

## 2020-01-07 DIAGNOSIS — Z86718 Personal history of other venous thrombosis and embolism: Secondary | ICD-10-CM | POA: Insufficient documentation

## 2020-01-07 DIAGNOSIS — Z171 Estrogen receptor negative status [ER-]: Secondary | ICD-10-CM | POA: Diagnosis not present

## 2020-01-07 LAB — CMP (CANCER CENTER ONLY)
ALT: 12 U/L (ref 0–44)
AST: 16 U/L (ref 15–41)
Albumin: 4.4 g/dL (ref 3.5–5.0)
Alkaline Phosphatase: 75 U/L (ref 38–126)
Anion gap: 9 (ref 5–15)
BUN: 15 mg/dL (ref 8–23)
CO2: 28 mmol/L (ref 22–32)
Calcium: 10.6 mg/dL — ABNORMAL HIGH (ref 8.9–10.3)
Chloride: 104 mmol/L (ref 98–111)
Creatinine: 0.7 mg/dL (ref 0.44–1.00)
GFR, Est AFR Am: >60 mL/min (ref >60)
GFR, Estimated: >60 mL/min (ref >60)
Glucose, Bld: 102 mg/dL — ABNORMAL HIGH (ref 70–99)
Potassium: 3.7 mmol/L (ref 3.5–5.1)
Sodium: 141 mmol/L (ref 135–145)
Total Bilirubin: 0.6 mg/dL (ref 0.3–1.2)
Total Protein: 7.5 g/dL (ref 6.5–8.1)

## 2020-01-07 LAB — CBC WITH DIFFERENTIAL (CANCER CENTER ONLY)
Abs Immature Granulocytes: 0.03 10*3/uL (ref 0.00–0.07)
Basophils Absolute: 0 10*3/uL (ref 0.0–0.1)
Basophils Relative: 1 %
Eosinophils Absolute: 0.1 10*3/uL (ref 0.0–0.5)
Eosinophils Relative: 1 %
HCT: 39.2 % (ref 36.0–46.0)
Hemoglobin: 12.6 g/dL (ref 12.0–15.0)
Immature Granulocytes: 0 %
Lymphocytes Relative: 22 %
Lymphs Abs: 1.7 10*3/uL (ref 0.7–4.0)
MCH: 31 pg (ref 26.0–34.0)
MCHC: 32.1 g/dL (ref 30.0–36.0)
MCV: 96.3 fL (ref 80.0–100.0)
Monocytes Absolute: 0.5 10*3/uL (ref 0.1–1.0)
Monocytes Relative: 7 %
Neutro Abs: 5.4 10*3/uL (ref 1.7–7.7)
Neutrophils Relative %: 69 %
Platelet Count: 287 10*3/uL (ref 150–400)
RBC: 4.07 MIL/uL (ref 3.87–5.11)
RDW: 13.6 % (ref 11.5–15.5)
WBC Count: 7.8 10*3/uL (ref 4.0–10.5)
nRBC: 0 % (ref 0.0–0.2)
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
## 2020-01-13 ENCOUNTER — Telehealth: Payer: Self-pay | Admitting: Hematology & Oncology

## 2020-01-13 NOTE — Telephone Encounter (Signed)
Appointments scheduled calendar mailed per 3/12 los  

## 2020-01-19 DIAGNOSIS — M8580 Other specified disorders of bone density and structure, unspecified site: Secondary | ICD-10-CM | POA: Diagnosis not present

## 2020-01-19 DIAGNOSIS — M8589 Other specified disorders of bone density and structure, multiple sites: Secondary | ICD-10-CM | POA: Diagnosis not present

## 2020-01-20 ENCOUNTER — Telehealth: Payer: Self-pay | Admitting: Orthopedic Surgery

## 2020-01-20 NOTE — Telephone Encounter (Signed)
Patient called. She would like a RX to see a Podiatrist. Her call back number is [phone]

## 2020-01-20 NOTE — Telephone Encounter (Signed)
Tried calling patient. No answer. LMVM with details per Dr Marlou Sa.

## 2020-01-20 NOTE — Telephone Encounter (Signed)
I do not know of any podiatrist that I can send her to.  If she has some kind of foot problem she may want to consider seeing Sharol Given.  But as far as a personal recommendation for podiatrist I do not have any.  Thanks

## 2020-01-20 NOTE — Telephone Encounter (Signed)
Pls advise thanks.  

## 2020-01-31 ENCOUNTER — Ambulatory Visit: Payer: Self-pay

## 2020-01-31 ENCOUNTER — Ambulatory Visit: Payer: Federal, State, Local not specified - PPO | Admitting: Orthopedic Surgery

## 2020-01-31 ENCOUNTER — Encounter: Payer: Self-pay | Admitting: Orthopedic Surgery

## 2020-01-31 ENCOUNTER — Other Ambulatory Visit: Payer: Self-pay

## 2020-01-31 VITALS — Ht 65.0 in | Wt 169.0 lb

## 2020-01-31 DIAGNOSIS — M722 Plantar fascial fibromatosis: Secondary | ICD-10-CM | POA: Diagnosis not present

## 2020-01-31 DIAGNOSIS — M79672 Pain in left foot: Secondary | ICD-10-CM

## 2020-02-01 ENCOUNTER — Encounter: Payer: Self-pay | Admitting: Orthopedic Surgery

## 2020-02-01 DIAGNOSIS — M722 Plantar fascial fibromatosis: Secondary | ICD-10-CM

## 2020-02-01 DIAGNOSIS — M79672 Pain in left foot: Secondary | ICD-10-CM | POA: Diagnosis not present

## 2020-02-01 MED ORDER — LIDOCAINE HCL 1 % IJ SOLN
2.0000 mL | INTRAMUSCULAR | Status: AC | PRN
  Administered 2020-02-01: 2 mL

## 2020-02-01 MED ORDER — METHYLPREDNISOLONE ACETATE 40 MG/ML IJ SUSP
40.0000 mg | INTRAMUSCULAR | Status: AC | PRN
  Administered 2020-02-01: 40 mg

## 2020-02-01 NOTE — Progress Notes (Signed)
Office Visit Note   Patient: Maureen Duncan           Date of Birth: Aug 11, 1951           MRN: [id number] Visit Date: 01/31/2020              Requested by: Deland Pretty, MD [address] [place],  [place] [address] PCP: Deland Pretty, MD  Chief Complaint  Patient presents with  . Left Foot - Pain      HPI: Patient is a 69 year old woman who presents with a chronic pain on the plantar aspect of the left heel.  She states it is getting worse worse with increased activities worse with start up.  She states that she limps at times.  Assessment & Plan: Visit Diagnoses:  1. Pain in left foot   2. Plantar fasciitis, left     Plan: The origin of plantar fascia was injected patient was given instructions and demonstrated Achilles stretching recommended a stiff soled sneaker and over-the-counter orthotics.  Follow-Up Instructions: Return if symptoms worsen or fail to improve.   Ortho Exam  Patient is alert, oriented, no adenopathy, well-dressed, normal affect, normal respiratory effort. Examination patient has good pulses.  She has dorsiflexion only to neutral with her knee extended.  She is point tender to palpation of the origin of the plantar fascia.  Lateral compression of the calcaneus is nontender the Achilles tendon is nontender.  The midfoot and forefoot are nontender to palpation.  She has good subtalar and ankle motion.  Imaging: XR Foot 2 Views Left  Result Date: 02/01/2020 2 view radiographs of the left foot shows spurring superior and inferior on the calcaneus without fracture.  No images are attached to the encounter.  Labs: Lab Results  Component Value Date   REPTSTATUS 03/28/2018 FINAL 03/27/2018   CULT (A) 03/27/2018    <10,000 COLONIES/mL INSIGNIFICANT GROWTH Performed at [facility] [address], [place], [place] [address]    LABORGA NO GROWTH 04/27/2014     Lab Results  Component Value Date   ALBUMIN 4.4 01/07/2020   ALBUMIN 3.1 (L) 11/02/2019   ALBUMIN 4.1 07/09/2019    No results found for: MG Lab Results  Component Value Date   VD25OH 29.5 (L) 01/06/2019   VD25OH 19.2 (L) 01/05/2018   VD25OH 37.2 01/27/2017    No results found for: PREALBUMIN CBC EXTENDED Latest Ref Rng & Units 01/07/2020 11/03/2019 11/02/2019  WBC 4.0 - 10.5 K/uL 7.8 8.3 14.0(H)  RBC 3.87 - 5.11 MIL/uL 4.07 3.84(L) 3.49(L)  HGB 12.0 - 15.0 g/dL 12.6 11.9(L) 11.0(L)  HCT 36.0 - 46.0 % 39.2 38.8 35.0(L)  PLT 150 - 400 K/uL 287 220 195  NEUTROABS 1.7 - 7.7 K/uL 5.4 - 12.8(H)  LYMPHSABS 0.7 - 4.0 K/uL 1.7 - 0.5(L)     Body mass index is 28.12 kg/m.  Orders:  Orders Placed This Encounter  Procedures  . XR Foot 2 Views Left   No orders of the defined types were placed in this encounter.    Procedures: Foot Inj  Date/Time: 02/01/2020 9:01 AM Performed by: Newt Minion, MD Authorized by: Newt Minion, MD   Consent Given by:  Patient Site marked: the procedure site was marked   Timeout: prior to procedure the correct patient, procedure, and site was verified   Indications:  Fasciitis and pain Condition: Plantar Fasciitis   Location: left plantar fascia muscle   Prep: patient was prepped and draped  in usual sterile fashion   Needle Size:  22 G Approach:  Plantar Medications:  2 mL lidocaine 1 %; 40 mg methylPREDNISolone acetate 40 MG/ML Patient Tolerance:  Patient tolerated the procedure well with no immediate complications    Clinical Data: No additional findings.  ROS:  All other systems negative, except as noted in the HPI. Review of Systems  Objective: Vital Signs: Ht 5' 5"  (1.651 m)   Wt 169 lb (76.7 kg)   BMI 28.12 kg/m   Specialty Comments:  No specialty comments available.  PMFS History: Patient Active Problem List   Diagnosis Date Noted  . Rib fractures 11/03/2019  . Multiple trauma 11/02/2019  . Arthritis of knee 04/07/2018  . Unilateral primary osteoarthritis, left knee   .  Prolapse of female bladder, acquired 05/08/2015  . Right elbow pain 07/25/2014  . Hyperlipidemia 04/28/2014  . Allergic rhinitis 04/28/2014  . Breast cancer, right. T2, N1. Mastectomy 01/30/2012. 01/09/2012   Past Medical History:  Diagnosis Date  . Arthritis   . Breast cancer [facility]) right breast dx 12/ 2000;  recurrence right breast 01/30/12   oncologist-  dr Marin Olp--  Stage IIb, grade 3, (T2n1M0) ductal carcinoma right breast-ER negative/ HER-2 positive---  s/p  right modified mastectomy with node dissection (3 positive) and chemotherapy (ended aug 2013) and Herceptin ended Aug 2014  . Cystocele   . GERD (gastroesophageal reflux disease)    occasionally, takes Zantac OTC when needed  . History of DVT (deep vein thrombosis)    left subclavian vein -- port of PAC--  removed 06-23-2012  . Hypertension   . LBBB (left bundle branch block)   . Lymphedema of upper extremity    right arm  . Postmenopausal osteoporosis   . Rib fractures 11/01/2019   fall at home  . Uterine prolapse   . Wears partial dentures    upper    Family History  Problem Relation Age of Onset  . Diabetes Mother   . Hypertension Mother   . Kidney failure Mother   . Kidney disease Mother   . Cancer Mother        pancreatic  . Other Brother        heart transplant  . Cancer Maternal Uncle        lung    Past Surgical History:  Procedure Laterality Date  . ABDOMINAL HYSTERECTOMY    . BREAST LUMPECTOMY Right Jan 2001  . COLONOSCOPY  last one -- June 2016   WNL  . CYSTOCELE REPAIR N/A 05/08/2015   Procedure: ANTERIOR REPAIR (CYSTOCELE);  Surgeon: Molli Posey, MD;  Location: [facility];  Service: Gynecology;  Laterality: N/A;  . LAPAROSCOPIC ASSISTED VAGINAL HYSTERECTOMY N/A 05/08/2015   Procedure: LAPAROSCOPIC ASSISTED VAGINAL HYSTERECTOMY;  Surgeon: Molli Posey, MD;  Location: [place];  Service: Gynecology;  Laterality: N/A;  . LAPAROSCOPIC BILATERAL SALPINGO  OOPHERECTOMY Bilateral 05/08/2015   Procedure: LAPAROSCOPIC BILATERAL SALPINGO OOPHORECTOMY;  Surgeon: Molli Posey, MD;  Location: [place];  Service: Gynecology;  Laterality: Bilateral;  . MODIFIED RADICAL MASTECTOMY W/ AXILLARY LYMPH NODE DISSECTION Right 01/30/12  . PORT-A-CATH REMOVAL  06/23/2012   Procedure: MINOR REMOVAL PORT-A-CATH;  Surgeon: Shann Medal, MD;  Location: [place];  Service: General;  Laterality: Left;  . PORTACATH PLACEMENT  01/30/2012   Procedure: INSERTION PORT-A-CATH;  Surgeon: Shann Medal, MD;  Location: [place];  Service: General;  Laterality: Left;  . TOTAL KNEE ARTHROPLASTY Left 04/07/2018  .  TOTAL KNEE ARTHROPLASTY Left 04/07/2018   Procedure: LEFT TOTAL KNEE ARTHROPLASTY;  Surgeon: Meredith Pel, MD;  Location: [facility];  Service: Orthopedics;  Laterality: Left;  . TRANSTHORACIC ECHOCARDIOGRAM  08-24-2012   grade I diastolic dysfunction,  ef 45-50%/  trivial MR  . TUBAL LIGATION  1980   Social History   Occupational History  . Not on file  Tobacco Use  . Smoking status: Former Smoker    Packs/day: 0.25    Years: 2.00    Pack years: 0.50    Types: Cigarettes    Start date: 03/31/2010    Quit date: 11/11/2011    Years since quitting: 8.2  . Smokeless tobacco: Never Used  . Tobacco comment: social smoker  Substance and Sexual Activity  . Alcohol use: Yes    Comment: occasional  . Drug use: No  . Sexual activity: Not on file

## 2020-03-03 ENCOUNTER — Other Ambulatory Visit: Payer: Self-pay | Admitting: Hematology & Oncology

## 2020-03-03 DIAGNOSIS — E559 Vitamin D deficiency, unspecified: Secondary | ICD-10-CM

## 2020-03-31 ENCOUNTER — Other Ambulatory Visit: Payer: Self-pay | Admitting: Hematology & Oncology

## 2020-05-08 DIAGNOSIS — Z01419 Encounter for gynecological examination (general) (routine) without abnormal findings: Secondary | ICD-10-CM | POA: Diagnosis not present

## 2020-05-23 ENCOUNTER — Telehealth: Payer: Self-pay | Admitting: Hematology & Oncology

## 2020-05-23 NOTE — Telephone Encounter (Signed)
9/13 appts rescheduled due to provider PAL.  Updated letter w/ calendar printed & mailed. 

## 2020-05-29 ENCOUNTER — Ambulatory Visit: Payer: Federal, State, Local not specified - PPO | Admitting: Orthopedic Surgery

## 2020-05-29 ENCOUNTER — Ambulatory Visit: Payer: Self-pay

## 2020-05-29 ENCOUNTER — Other Ambulatory Visit: Payer: Self-pay

## 2020-05-29 VITALS — Ht 66.0 in | Wt 169.0 lb

## 2020-05-29 DIAGNOSIS — M25561 Pain in right knee: Secondary | ICD-10-CM | POA: Diagnosis not present

## 2020-06-02 ENCOUNTER — Encounter: Payer: Self-pay | Admitting: Orthopedic Surgery

## 2020-06-02 NOTE — Progress Notes (Signed)
Office Visit Note   Patient: Maureen Duncan           Date of Birth: Nov 22, 1950           MRN: [id number] Visit Date: 05/29/2020 Requested by: Deland Pretty, MD [address] [place],  [place] [address] PCP: Deland Pretty, MD  Subjective: Chief Complaint  Patient presents with  . Right Knee - Pain    HPI: Maureen Duncan is a 69 year old patient with right knee pain.  She is had pain for several months without history of injury.  Pain is constant but she does use a rub on the knee.  She reports mechanical symptoms in the knee where she feels something slip.  Has difficulty with stairs.  The pain does not wake her from sleep at night.  Rates the pain is level 6 out of 10.  She takes Advil which helps.              ROS: All systems reviewed are negative as they relate to the chief complaint within the history of present illness.  Patient denies  fevers or chills.   Assessment & Plan: Visit Diagnoses:  1. Right knee pain, unspecified chronicity     Plan: Impression is right knee pain with medial joint line tenderness and mild arthritis on plain radiographs.  I think there could be a mechanical source of this pain based on the movement she describes in the knee.  Definitely with varus stress she feels something slipping in the medial compartment of the knee.  There is a trace effusion present.  Certainly does not have the significant arthritis that she had in the left knee.  That total knee replacements doing reasonably well.  She needs an MRI scan to evaluate for that medial meniscal tear in order to consider intervention that is short of knee replacement to give her some relief.  We will see her back after that study  Follow-Up Instructions: Return for after MRI.   Orders:  Orders Placed This Encounter  Procedures  . XR Knee 1-2 Views Right  . MR Knee Right w/o contrast   No orders of the defined types were placed in this encounter.     Procedures: No procedures  performed   Clinical Data: No additional findings.  Objective: Vital Signs: Ht 5' 6"  (1.676 m)   Wt 169 lb (76.7 kg)   BMI 27.28 kg/m   Physical Exam:   Constitutional: Patient appears well-developed HEENT:  Head: Normocephalic Eyes:EOM are normal Neck: Normal range of motion Cardiovascular: Normal rate Pulmonary/chest: Effort normal Neurologic: Patient is alert Skin: Skin is warm Psychiatric: Patient has normal mood and affect    Ortho Exam: Ortho exam demonstrates slightly antalgic gait to the right.  Trace effusion right knee.  Extensor mechanism is intact.  Medial greater than lateral joint line tenderness.  Collateral crucial ligaments are stable.  No masses lymphadenopathy or skin changes noted in that knee region.  Range of motion is full.  No groin pain with internal extra rotation of the leg.  Extensor mechanism is intact and nontender  Specialty Comments:  No specialty comments available.  Imaging: No results found.   PMFS History: Patient Active Problem List   Diagnosis Date Noted  . Rib fractures 11/03/2019  . Multiple trauma 11/02/2019  . Arthritis of knee 04/07/2018  . Unilateral primary osteoarthritis, left knee   . Prolapse of female bladder, acquired 05/08/2015  . Right elbow pain 07/25/2014  . Hyperlipidemia 04/28/2014  .  Allergic rhinitis 04/28/2014  . Breast cancer, right. T2, N1. Mastectomy 01/30/2012. 01/09/2012   Past Medical History:  Diagnosis Date  . Arthritis   . Breast cancer [facility]) right breast dx 12/ 2000;  recurrence right breast 01/30/12   oncologist-  dr Marin Olp--  Stage IIb, grade 3, (T2n1M0) ductal carcinoma right breast-ER negative/ HER-2 positive---  s/p  right modified mastectomy with node dissection (3 positive) and chemotherapy (ended aug 2013) and Herceptin ended Aug 2014  . Cystocele   . GERD (gastroesophageal reflux disease)    occasionally, takes Zantac OTC when needed  . History of DVT (deep vein thrombosis)    left  subclavian vein -- port of PAC--  removed 06-23-2012  . Hypertension   . LBBB (left bundle branch block)   . Lymphedema of upper extremity    right arm  . Postmenopausal osteoporosis   . Rib fractures 11/01/2019   fall at home  . Uterine prolapse   . Wears partial dentures    upper    Family History  Problem Relation Age of Onset  . Diabetes Mother   . Hypertension Mother   . Kidney failure Mother   . Kidney disease Mother   . Cancer Mother        pancreatic  . Other Brother        heart transplant  . Cancer Maternal Uncle        lung    Past Surgical History:  Procedure Laterality Date  . ABDOMINAL HYSTERECTOMY    . BREAST LUMPECTOMY Right Jan 2001  . COLONOSCOPY  last one -- June 2016   WNL  . CYSTOCELE REPAIR N/A 05/08/2015   Procedure: ANTERIOR REPAIR (CYSTOCELE);  Surgeon: Molli Posey, MD;  Location: [facility];  Service: Gynecology;  Laterality: N/A;  . LAPAROSCOPIC ASSISTED VAGINAL HYSTERECTOMY N/A 05/08/2015   Procedure: LAPAROSCOPIC ASSISTED VAGINAL HYSTERECTOMY;  Surgeon: Molli Posey, MD;  Location: [place];  Service: Gynecology;  Laterality: N/A;  . LAPAROSCOPIC BILATERAL SALPINGO OOPHERECTOMY Bilateral 05/08/2015   Procedure: LAPAROSCOPIC BILATERAL SALPINGO OOPHORECTOMY;  Surgeon: Molli Posey, MD;  Location: [place];  Service: Gynecology;  Laterality: Bilateral;  . MODIFIED RADICAL MASTECTOMY W/ AXILLARY LYMPH NODE DISSECTION Right 01/30/12  . PORT-A-CATH REMOVAL  06/23/2012   Procedure: MINOR REMOVAL PORT-A-CATH;  Surgeon: Shann Medal, MD;  Location: [place];  Service: General;  Laterality: Left;  . PORTACATH PLACEMENT  01/30/2012   Procedure: INSERTION PORT-A-CATH;  Surgeon: Shann Medal, MD;  Location: [place];  Service: General;  Laterality: Left;  . TOTAL KNEE ARTHROPLASTY Left 04/07/2018  . TOTAL KNEE ARTHROPLASTY Left 04/07/2018   Procedure: LEFT TOTAL KNEE ARTHROPLASTY;   Surgeon: Meredith Pel, MD;  Location: [place];  Service: Orthopedics;  Laterality: Left;  . TRANSTHORACIC ECHOCARDIOGRAM  08-24-2012   grade I diastolic dysfunction,  ef 45-50%/  trivial MR  . TUBAL LIGATION  1980   Social History   Occupational History  . Not on file  Tobacco Use  . Smoking status: Former Smoker    Packs/day: 0.25    Years: 2.00    Pack years: 0.50    Types: Cigarettes    Start date: 03/31/2010    Quit date: 11/11/2011    Years since quitting: 8.5  . Smokeless tobacco: Never Used  . Tobacco comment: social smoker  Vaping Use  . Vaping Use: Never used  Substance and Sexual Activity  . Alcohol use: Yes    Comment:  occasional  . Drug use: No  . Sexual activity: Not on file

## 2020-06-22 ENCOUNTER — Ambulatory Visit
Admission: RE | Admit: 2020-06-22 | Discharge: 2020-06-22 | Disposition: A | Discharge status: Home / Self Care | Payer: Federal, State, Local not specified - PPO | Source: Ambulatory Visit | Attending: Orthopedic Surgery | Admitting: Orthopedic Surgery

## 2020-06-22 DIAGNOSIS — M25561 Pain in right knee: Secondary | ICD-10-CM

## 2020-06-28 ENCOUNTER — Ambulatory Visit (INDEPENDENT_AMBULATORY_CARE_PROVIDER_SITE_OTHER): Payer: Federal, State, Local not specified - PPO | Admitting: Orthopedic Surgery

## 2020-06-28 DIAGNOSIS — M1711 Unilateral primary osteoarthritis, right knee: Secondary | ICD-10-CM

## 2020-06-29 ENCOUNTER — Encounter: Payer: Self-pay | Admitting: Orthopedic Surgery

## 2020-06-29 DIAGNOSIS — M1711 Unilateral primary osteoarthritis, right knee: Secondary | ICD-10-CM

## 2020-06-29 MED ORDER — METHYLPREDNISOLONE ACETATE 40 MG/ML IJ SUSP
40.0000 mg | INTRAMUSCULAR | Status: AC | PRN
  Administered 2020-06-29: 40 mg via INTRA_ARTICULAR

## 2020-06-29 MED ORDER — LIDOCAINE HCL 1 % IJ SOLN
5.0000 mL | INTRAMUSCULAR | Status: AC | PRN
  Administered 2020-06-29: 5 mL

## 2020-06-29 MED ORDER — BUPIVACAINE HCL 0.25 % IJ SOLN
4.0000 mL | INTRAMUSCULAR | Status: AC | PRN
  Administered 2020-06-29: 4 mL via INTRA_ARTICULAR

## 2020-06-29 NOTE — Progress Notes (Signed)
Office Visit Note   Patient: Maureen Duncan           Date of Birth: 1951-03-31           MRN: [id number] Visit Date: 06/28/2020 Requested by: Deland Pretty, MD [place],  [place] [address] PCP: Deland Pretty, MD  Subjective: Chief Complaint  Patient presents with  . review MRI scan    HPI: [name] is a 69 year old female with right knee pain.  She states that the knee gives out.  She uses lidocaine cream and Aspercreme without much relief.  Hard for her to cut the grass.  Hard for her to bend the knee sometimes.  Planning on retiring in 2023.  MRI scan is reviewed and it shows significant arthritis in the lateral more than medial compartment.  She does have some degenerative meniscal pathology but nothing that looks unstable or that is giving any mechanical symptoms.              ROS: All systems reviewed are negative as they relate to the chief complaint within the history of present illness.  Patient denies  fevers or chills.   Assessment & Plan: Visit Diagnoses:  1. Arthritis of right knee     Plan: Impression is right knee pain.  Arthritis is present.  Unlikely that arthroscopy would give her predictable relief based on the amount of degeneration present on the articular surfaces.  We will try an aspiration and injection of the left knee today.  Plan on preapproving her for gel injections next.  She wants to avoid surgery if possible.  I think at some point she may need a knee replacement but we will see how that plays out in the months and years to come.  This patient is diagnosed with osteoarthritis of the knee(s).    Radiographs show evidence of joint space narrowing, osteophytes, subchondral sclerosis and/or subchondral cysts.  This patient has knee pain which interferes with functional and activities of daily living.    This patient has experienced inadequate response, adverse effects and/or intolerance with conservative treatments such as  acetaminophen, NSAIDS, topical creams, physical therapy or regular exercise, knee bracing and/or weight loss.   This patient has experienced inadequate response or has a contraindication to intra articular steroid injections for at least 3 months.   This patient is not scheduled to have a total knee replacement within 6 months of starting treatment with viscosupplementation.   Follow-Up Instructions: Return if symptoms worsen or fail to improve.   Orders:  No orders of the defined types were placed in this encounter.  No orders of the defined types were placed in this encounter.     Procedures: Large Joint Inj: R knee on 06/29/2020 7:16 AM Indications: diagnostic evaluation, joint swelling and pain Details: 18 G 1.5 in needle, superolateral approach  Arthrogram: No  Medications: 5 mL lidocaine 1 %; 40 mg methylPREDNISolone acetate 40 MG/ML; 4 mL bupivacaine 0.25 % Outcome: tolerated well, no immediate complications Procedure, treatment alternatives, risks and benefits explained, specific risks discussed. Consent was given by the patient. Immediately prior to procedure a time out was called to verify the correct patient, procedure, equipment, support staff and site/side marked as required. Patient was prepped and draped in the usual sterile fashion.       Clinical Data: No additional findings.  Objective: Vital Signs: There were no vitals taken for this visit.  Physical Exam:   Constitutional: Patient appears well-developed HEENT:  Head:  Normocephalic Eyes:EOM are normal Neck: Normal range of motion Cardiovascular: Normal rate Pulmonary/chest: Effort normal Neurologic: Patient is alert Skin: Skin is warm Psychiatric: Patient has normal mood and affect    Ortho Exam: Ortho exam demonstrates mild right knee effusion.  Extensor mechanism intact.  Collateral and cruciate ligaments stable.  No groin pain with internal/external rotation of the leg.  No other masses  lymphadenopathy or skin changes noted in that right knee region.  Patient has medial and lateral joint line tenderness.  Pedal pulses palpable.  Specialty Comments:  No specialty comments available.  Imaging: No results found.   PMFS History: Patient Active Problem List   Diagnosis Date Noted  . Rib fractures 11/03/2019  . Multiple trauma 11/02/2019  . Arthritis of knee 04/07/2018  . Unilateral primary osteoarthritis, left knee   . Prolapse of female bladder, acquired 05/08/2015  . Right elbow pain 07/25/2014  . Hyperlipidemia 04/28/2014  . Allergic rhinitis 04/28/2014  . Breast cancer, right. T2, N1. Mastectomy 01/30/2012. 01/09/2012   Past Medical History:  Diagnosis Date  . Arthritis   . Breast cancer [facility]) right breast dx 12/ 2000;  recurrence right breast 01/30/12   oncologist-  dr Marin Olp--  Stage IIb, grade 3, (T2n1M0) ductal carcinoma right breast-ER negative/ HER-2 positive---  s/p  right modified mastectomy with node dissection (3 positive) and chemotherapy (ended aug 2013) and Herceptin ended Aug 2014  . Cystocele   . GERD (gastroesophageal reflux disease)    occasionally, takes Zantac OTC when needed  . History of DVT (deep vein thrombosis)    left subclavian vein -- port of PAC--  removed 06-23-2012  . Hypertension   . LBBB (left bundle branch block)   . Lymphedema of upper extremity    right arm  . Postmenopausal osteoporosis   . Rib fractures 11/01/2019   fall at home  . Uterine prolapse   . Wears partial dentures    upper    Family History  Problem Relation Age of Onset  . Diabetes Mother   . Hypertension Mother   . Kidney failure Mother   . Kidney disease Mother   . Cancer Mother        pancreatic  . Other Brother        heart transplant  . Cancer Maternal Uncle        lung    Past Surgical History:  Procedure Laterality Date  . ABDOMINAL HYSTERECTOMY    . BREAST LUMPECTOMY Right Jan 2001  . COLONOSCOPY  last one -- June 2016   WNL  .  CYSTOCELE REPAIR N/A 05/08/2015   Procedure: ANTERIOR REPAIR (CYSTOCELE);  Surgeon: Molli Posey, MD;  Location: [facility];  Service: Gynecology;  Laterality: N/A;  . LAPAROSCOPIC ASSISTED VAGINAL HYSTERECTOMY N/A 05/08/2015   Procedure: LAPAROSCOPIC ASSISTED VAGINAL HYSTERECTOMY;  Surgeon: Molli Posey, MD;  Location: [place];  Service: Gynecology;  Laterality: N/A;  . LAPAROSCOPIC BILATERAL SALPINGO OOPHERECTOMY Bilateral 05/08/2015   Procedure: LAPAROSCOPIC BILATERAL SALPINGO OOPHORECTOMY;  Surgeon: Molli Posey, MD;  Location: [place];  Service: Gynecology;  Laterality: Bilateral;  . MODIFIED RADICAL MASTECTOMY W/ AXILLARY LYMPH NODE DISSECTION Right 01/30/12  . PORT-A-CATH REMOVAL  06/23/2012   Procedure: MINOR REMOVAL PORT-A-CATH;  Surgeon: Shann Medal, MD;  Location: [place];  Service: General;  Laterality: Left;  . PORTACATH PLACEMENT  01/30/2012   Procedure: INSERTION PORT-A-CATH;  Surgeon: Shann Medal, MD;  Location: [place];  Service: General;  Laterality:  Left;  . TOTAL KNEE ARTHROPLASTY Left 04/07/2018  . TOTAL KNEE ARTHROPLASTY Left 04/07/2018   Procedure: LEFT TOTAL KNEE ARTHROPLASTY;  Surgeon: Meredith Pel, MD;  Location: [place];  Service: Orthopedics;  Laterality: Left;  . TRANSTHORACIC ECHOCARDIOGRAM  08-24-2012   grade I diastolic dysfunction,  ef 45-50%/  trivial MR  . TUBAL LIGATION  1980   Social History   Occupational History  . Not on file  Tobacco Use  . Smoking status: Former Smoker    Packs/day: 0.25    Years: 2.00    Pack years: 0.50    Types: Cigarettes    Start date: 03/31/2010    Quit date: 11/11/2011    Years since quitting: 8.6  . Smokeless tobacco: Never Used  . Tobacco comment: social smoker  Vaping Use  . Vaping Use: Never used  Substance and Sexual Activity  . Alcohol use: Yes    Comment: occasional  . Drug use: No  . Sexual activity: Not on file

## 2020-07-04 ENCOUNTER — Other Ambulatory Visit (INDEPENDENT_AMBULATORY_CARE_PROVIDER_SITE_OTHER): Payer: Self-pay | Admitting: Surgical

## 2020-07-04 NOTE — Progress Notes (Signed)
Noted  

## 2020-07-04 NOTE — Telephone Encounter (Signed)
Please advise. Thanks.  

## 2020-07-10 ENCOUNTER — Other Ambulatory Visit: Payer: Federal, State, Local not specified - PPO

## 2020-07-10 ENCOUNTER — Ambulatory Visit: Payer: Federal, State, Local not specified - PPO | Admitting: Hematology & Oncology

## 2020-07-11 ENCOUNTER — Inpatient Hospital Stay: Payer: Federal, State, Local not specified - PPO

## 2020-07-11 ENCOUNTER — Inpatient Hospital Stay: Payer: Federal, State, Local not specified - PPO | Admitting: Hematology & Oncology

## 2020-07-14 ENCOUNTER — Telehealth: Payer: Self-pay

## 2020-07-14 DIAGNOSIS — Z13820 Encounter for screening for osteoporosis: Secondary | ICD-10-CM | POA: Diagnosis not present

## 2020-07-14 NOTE — Telephone Encounter (Signed)
Submitted VOB, Durolane, right knee. 

## 2020-07-28 ENCOUNTER — Telehealth: Payer: Self-pay

## 2020-07-28 NOTE — Telephone Encounter (Signed)
Approved, Durolane, right knee. [place] has been met Patient will be responsible for 15% OOP. Co-pay of $35.00 No PA required  Appt. 08/09/2020 with Dr. Marlou Sa

## 2020-08-09 ENCOUNTER — Ambulatory Visit: Payer: Federal, State, Local not specified - PPO | Admitting: Orthopedic Surgery

## 2020-08-09 DIAGNOSIS — M1711 Unilateral primary osteoarthritis, right knee: Secondary | ICD-10-CM

## 2020-08-12 ENCOUNTER — Encounter: Payer: Self-pay | Admitting: Orthopedic Surgery

## 2020-08-12 DIAGNOSIS — M1711 Unilateral primary osteoarthritis, right knee: Secondary | ICD-10-CM

## 2020-08-12 NOTE — Progress Notes (Signed)
   Procedure Note  Patient: Maureen Duncan             Date of Birth: 1951/01/01           MRN: [id number]             Visit Date: 08/09/2020  Procedures: Visit Diagnoses:  1. Arthritis of right knee     Large Joint Inj: R knee on 08/12/2020 6:21 PM Indications: diagnostic evaluation, joint swelling and pain Details: 18 G 1.5 in needle, superolateral approach  Arthrogram: No  Medications: 5 mL lidocaine 1 %; 60 mg Sodium Hyaluronate 60 MG/3ML Outcome: tolerated well, no immediate complications Procedure, treatment alternatives, risks and benefits explained, specific risks discussed. Consent was given by the patient. Immediately prior to procedure a time out was called to verify the correct patient, procedure, equipment, support staff and site/side marked as required. Patient was prepped and draped in the usual sterile fashion.

## 2020-08-13 ENCOUNTER — Encounter: Payer: Self-pay | Admitting: Orthopedic Surgery

## 2020-08-13 MED ORDER — LIDOCAINE HCL 1 % IJ SOLN
5.0000 mL | INTRAMUSCULAR | Status: AC | PRN
  Administered 2020-08-12: 5 mL

## 2020-08-13 MED ORDER — SODIUM HYALURONATE 60 MG/3ML IX PRSY
60.0000 mg | PREFILLED_SYRINGE | INTRA_ARTICULAR | Status: AC | PRN
  Administered 2020-08-12: 60 mg via INTRA_ARTICULAR

## 2020-08-17 DIAGNOSIS — Z23 Encounter for immunization: Secondary | ICD-10-CM | POA: Diagnosis not present

## 2020-08-23 ENCOUNTER — Other Ambulatory Visit: Payer: Self-pay

## 2020-08-23 ENCOUNTER — Inpatient Hospital Stay (HOSPITAL_BASED_OUTPATIENT_CLINIC_OR_DEPARTMENT_OTHER): Payer: Federal, State, Local not specified - PPO | Admitting: Hematology & Oncology

## 2020-08-23 ENCOUNTER — Encounter: Payer: Self-pay | Admitting: Hematology & Oncology

## 2020-08-23 ENCOUNTER — Inpatient Hospital Stay: Payer: Federal, State, Local not specified - PPO | Attending: Hematology & Oncology

## 2020-08-23 VITALS — BP 147/91 | HR 78 | Temp 98.2°F | Resp 20 | Wt 167.1 lb

## 2020-08-23 DIAGNOSIS — Z9221 Personal history of antineoplastic chemotherapy: Secondary | ICD-10-CM | POA: Insufficient documentation

## 2020-08-23 DIAGNOSIS — Z171 Estrogen receptor negative status [ER-]: Secondary | ICD-10-CM | POA: Insufficient documentation

## 2020-08-23 DIAGNOSIS — C50011 Malignant neoplasm of nipple and areola, right female breast: Secondary | ICD-10-CM

## 2020-08-23 DIAGNOSIS — Z853 Personal history of malignant neoplasm of breast: Secondary | ICD-10-CM | POA: Diagnosis not present

## 2020-08-23 LAB — CMP (CANCER CENTER ONLY)
ALT: 11 U/L (ref 0–44)
AST: 16 U/L (ref 15–41)
Albumin: 4.2 g/dL (ref 3.5–5.0)
Alkaline Phosphatase: 53 U/L (ref 38–126)
Anion gap: 6 (ref 5–15)
BUN: 17 mg/dL (ref 8–23)
CO2: 32 mmol/L (ref 22–32)
Calcium: 11.1 mg/dL — ABNORMAL HIGH (ref 8.9–10.3)
Chloride: 102 mmol/L (ref 98–111)
Creatinine: 0.68 mg/dL (ref 0.44–1.00)
GFR, Estimated: >60 mL/min (ref >60)
Glucose, Bld: 96 mg/dL (ref 70–99)
Potassium: 3.7 mmol/L (ref 3.5–5.1)
Sodium: 140 mmol/L (ref 135–145)
Total Bilirubin: 0.4 mg/dL (ref 0.3–1.2)
Total Protein: 7.3 g/dL (ref 6.5–8.1)

## 2020-08-23 LAB — CBC WITH DIFFERENTIAL (CANCER CENTER ONLY)
Abs Immature Granulocytes: 0.02 10*3/uL (ref 0.00–0.07)
Basophils Absolute: 0 10*3/uL (ref 0.0–0.1)
Basophils Relative: 1 %
Eosinophils Absolute: 0.1 10*3/uL (ref 0.0–0.5)
Eosinophils Relative: 1 %
HCT: 36.6 % (ref 36.0–46.0)
Hemoglobin: 11.9 g/dL — ABNORMAL LOW (ref 12.0–15.0)
Immature Granulocytes: 0 %
Lymphocytes Relative: 27 %
Lymphs Abs: 1.7 10*3/uL (ref 0.7–4.0)
MCH: 31.6 pg (ref 26.0–34.0)
MCHC: 32.5 g/dL (ref 30.0–36.0)
MCV: 97.3 fL (ref 80.0–100.0)
Monocytes Absolute: 0.6 10*3/uL (ref 0.1–1.0)
Monocytes Relative: 9 %
Neutro Abs: 4 10*3/uL (ref 1.7–7.7)
Neutrophils Relative %: 62 %
Platelet Count: 243 10*3/uL (ref 150–400)
RBC: 3.76 MIL/uL — ABNORMAL LOW (ref 3.87–5.11)
RDW: 12.7 % (ref 11.5–15.5)
WBC Count: 6.5 10*3/uL (ref 4.0–10.5)
nRBC: 0 % (ref 0.0–0.2)

## 2020-08-23 NOTE — Progress Notes (Signed)
Hematology and Oncology Follow Up Visit  Maureen Duncan [id number] 27-May-1951 69 y.o. 08/23/2020   Principle Diagnosis:  Stage IIb (T2N1M0) ductal carcinoma of the right breast-ER negative/HER-2 positive History of DVT of the left subclavian vein  Current Therapy:    Aspirin 162 mg by mouth daily     Interim History:  Ms.  Duncan is back for followup.  She just drove back from [place].  She was down in [place] seen her grandson.  He is in a high school marching band.  She really enjoyed going down there.  We last saw her back in March.  She had a good summer.  She has been with some of her girlfriends and they were able to catch up on their lives.  She is by going to retire next year.  She has been working 35 years.  I think she is ready for retirement.  She has had no problems with health.  She has had no problems with the coronavirus.  She has had the coronavirus vaccine and booster.  She has had no issues with nausea or vomiting.  There is been no change in bowel or bladder habits.  She has had no headache.  She has had no leg swelling.  Overall, I would say performance status is ECOG 1.      Medications:  Current Outpatient Medications:    acetaminophen (TYLENOL) 500 MG tablet, Take 2 tablets (1,000 mg total) by mouth every 6 (six) hours., Disp: 30 tablet, Rfl: 0   amLODipine-benazepril (LOTREL) 5-20 MG capsule, TAKE 1 CAPSULE BY MOUTH EVERY DAY (Patient taking differently: Take 1 capsule by mouth daily. ), Disp: 30 capsule, Rfl: 4   amoxicillin (AMOXIL) 500 MG tablet, TAKE 4 TABLETS BY MOUTH 1 HOUR PRIOR TO DENTAL PROCEDURE, Disp: 10 tablet, Rfl: 0   atorvastatin (LIPITOR) 40 MG tablet, Take 40 mg by mouth daily., Disp: , Rfl:    diphenhydrAMINE (BENADRYL) 25 MG tablet, Take 50 mg by mouth daily as needed for itching or allergies. , Disp: , Rfl:    diphenhydramine-acetaminophen (TYLENOL PM) 25-500 MG TABS, Take 1 tablet by mouth at bedtime as needed (sleep). , Disp: ,  Rfl:    docusate sodium (COLACE) 100 MG capsule, Take 1 capsule (100 mg total) by mouth 2 (two) times daily. (Patient taking differently: Take 200 mg by mouth daily as needed for mild constipation or moderate constipation. ), Disp: 10 capsule, Rfl: 0   fluticasone (FLONASE) 50 MCG/ACT nasal spray, PLACE 2 SPRAYS INTO BOTH NOSTRILS AS NEEDED., Disp: 16 mL, Rfl: 3   magnesium citrate SOLN, Take 1 Bottle by mouth every 30 (thirty) days., Disp: , Rfl:    triamcinolone cream (KENALOG) 0.1 %, Apply 1 application topically daily as needed (rash). , Disp: , Rfl:    Vitamin D, Ergocalciferol, (DRISDOL) 1.25 MG (50000 UNIT) CAPS capsule, TAKE 1 CAPSULE (50,000 UNITS TOTAL) BY MOUTH 2 (TWO) TIMES A WEEK., Disp: 24 capsule, Rfl: 3   traMADol (ULTRAM) 50 MG tablet, Take 1 tablet (50 mg total) by mouth every 6 (six) hours as needed (mild pain). (Patient not taking: Reported on 08/23/2020), Disp: 30 tablet, Rfl: 0 No current facility-administered medications for this visit.  Facility-Administered Medications Ordered in Other Visits:    fentaNYL (SUBLIMAZE) injection, , , Anesthesia Intra-op, Jenne Campus, CRNA, 50 mcg at 02/24/18 3009   lactated ringers infusion, , , Continuous PRN, Jenne Campus, CRNA, [place] Bag at 04/07/18 2330   midazolam (VERSED) 5 MG/5ML injection, , ,  Anesthesia Intra-op, Jenne Campus, CRNA, 2 mg at 02/24/18 0703   sodium chloride 0.9 % injection 10 mL, 10 mL, Intravenous, PRN, Volanda Napoleon, MD, 10 mL at 06/17/12 1651  Allergies:  Allergies  Allergen Reactions   Meloxicam     Pt states "gives me a weird feeling"  "makes my heart race and makes me anxious"    Adhesive [Tape] Rash    Past Medical History, Surgical history, Social history, and Family History were reviewed and updated.  Review of Systems: Review of Systems  Constitutional: Negative.   HENT: Negative.   Eyes: Negative.   Respiratory: Negative.   Cardiovascular: Negative.   Gastrointestinal:  Negative.   Genitourinary: Negative.   Musculoskeletal: Negative.   Skin: Negative.   Neurological: Negative.   Endo/Heme/Allergies: Negative.   Psychiatric/Behavioral: Negative.     Physical Exam:  weight is 167 lb 1.9 oz (75.8 kg). Her oral temperature is 98.2 F (36.8 C). Her blood pressure is 147/91 (abnormal) and her pulse is 78. Her respiration is 20 and oxygen saturation is 100%.   Physical Exam Vitals reviewed.  Constitutional:      Comments: Breast exam shows left breast with no masses, edema or erythema.  There is no left nipple discharge.  There is no left axillary adenopathy.  Right chest wall shows well-healed mastectomy.  There is no right chest wall nodules.  There is no right chest wall erythema.  There is no right axillary adenopathy.  HENT:     Head: Normocephalic and atraumatic.  Eyes:     Pupils: Pupils are equal, round, and reactive to light.  Cardiovascular:     Rate and Rhythm: Normal rate and regular rhythm.     Heart sounds: Normal heart sounds.  Pulmonary:     Effort: Pulmonary effort is normal.     Breath sounds: Normal breath sounds.  Abdominal:     General: Bowel sounds are normal.     Palpations: Abdomen is soft.  Musculoskeletal:        General: No tenderness or deformity. Normal range of motion.     Cervical back: Normal range of motion.  Lymphadenopathy:     Cervical: No cervical adenopathy.  Skin:    General: Skin is warm and dry.     Findings: No erythema or rash.  Neurological:     Mental Status: She is alert and oriented to person, place, and time.  Psychiatric:        Behavior: Behavior normal.        Thought Content: Thought content normal.        Judgment: Judgment normal.      Lab Results  Component Value Date   WBC 7.8 01/07/2020   HGB 12.6 01/07/2020   HCT 39.2 01/07/2020   MCV 96.3 01/07/2020   PLT 287 01/07/2020     Chemistry      Component Value Date/Time   NA 141 01/07/2020 0814   NA 141 07/28/2017 0911   NA  142 01/27/2017 0849   K 3.7 01/07/2020 0814   K 3.8 07/28/2017 0911   K 4.3 01/27/2017 0849   CL 104 01/07/2020 0814   CL 105 07/28/2017 0911   CO2 28 01/07/2020 0814   CO2 29 07/28/2017 0911   CO2 29 01/27/2017 0849   BUN 15 01/07/2020 0814   BUN 13 07/28/2017 0911   BUN 16.9 01/27/2017 0849   CREATININE 0.70 01/07/2020 0814   CREATININE 0.8 07/28/2017 0911   CREATININE 0.8  01/27/2017 0849      Component Value Date/Time   CALCIUM 10.6 (H) 01/07/2020 0814   CALCIUM 10.0 07/28/2017 0911   CALCIUM 10.5 (H) 01/27/2017 0849   ALKPHOS 75 01/07/2020 0814   ALKPHOS 64 07/28/2017 0911   ALKPHOS 67 01/27/2017 0849   AST 16 01/07/2020 0814   AST 20 01/27/2017 0849   ALT 12 01/07/2020 0814   ALT 19 07/28/2017 0911   ALT 16 01/27/2017 0849   BILITOT 0.6 01/07/2020 0814   BILITOT 0.53 01/27/2017 0849      Impression and Plan: Ms. Baum is 69 year old African female. She has a history of stage IIb ductal carcinoma of the right breast. Her tumor was HER-2 positive. She was treated with systemic chemotherapy followed by a year of Herceptin. She completed this in August of 2014.  Of note, she had 3 positive lymph nodes. She did receive 6 cycles of chemotherapy with Taxotere and carboplatinum.  I do not see any evidence of recurrent disease.  I realize that she certainly is at risk for recurrence.  I am just so happy that her overall quality of life is doing well.  She still wishes that her grandson was up here.  Maybe when he goes to college, he will go to [place].  We will go ahead and plan to get her back in another 6 months.   Volanda Napoleon, MD 10/27/20213:44 PM

## 2020-08-24 ENCOUNTER — Telehealth: Payer: Self-pay | Admitting: Hematology & Oncology

## 2020-08-24 NOTE — Telephone Encounter (Signed)
Appointments scheduled calendar printed & mailed per 10/27 los 

## 2020-09-01 ENCOUNTER — Other Ambulatory Visit: Payer: Self-pay | Admitting: Hematology & Oncology

## 2020-09-15 IMAGING — MR MR KNEE*R* W/O CM
4 of 7 series · 19 of 40 positions shown · non-contrast
Comparison: Right knee x-rays dated May 29, 2020.

CLINICAL DATA: Worsening right knee pain over the past 9 months. No
injury or prior surgery.

EXAM:
MRI OF THE RIGHT KNEE WITHOUT CONTRAST
TECHNIQUE: Multiplanar, multisequence MR imaging of the knee was performed. No
intravenous contrast was administered.

[Series 3: T2 fat-sat · axial · 4.0mm · 0.31mm/px · z∈[-15,+91]mm · 3 of 25 slices shown]
[im 1/25]
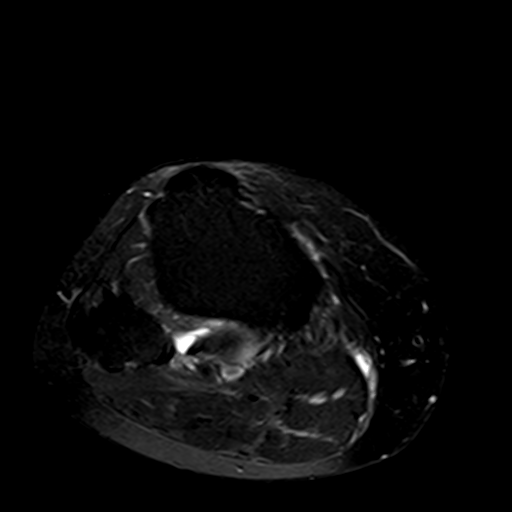
[im 13/25]
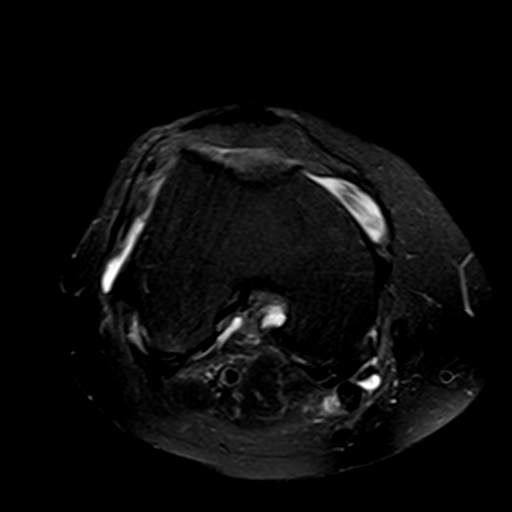
[im 25/25]
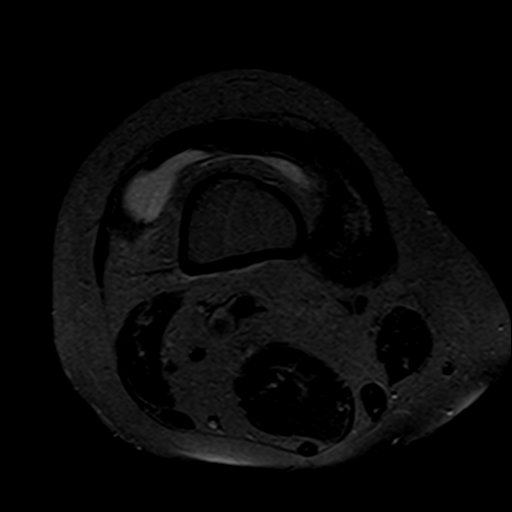

[Series 6: PD fat-sat · coronal · 3.0mm · 0.29mm/px · 7 of 28 slices shown (1 of 3)]
[im 1/28]
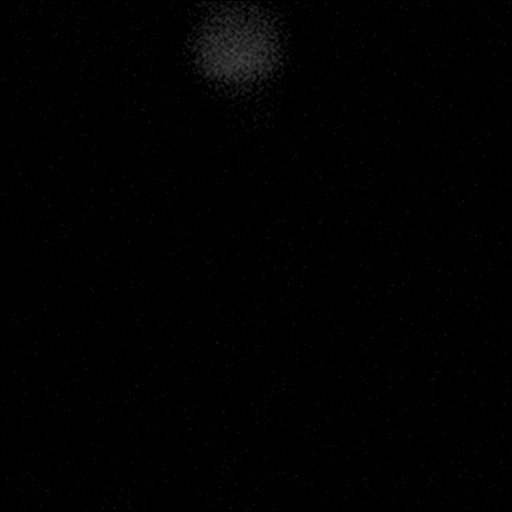
[im 5/28]
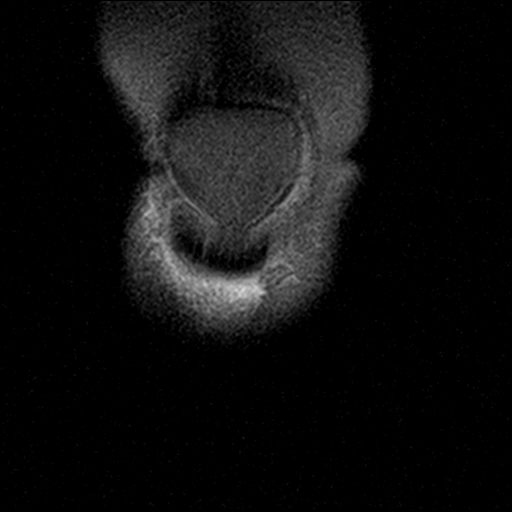
[im 10/28]
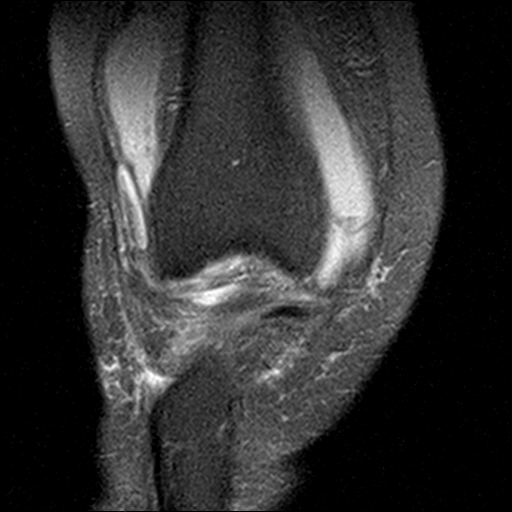
[im 14/28]
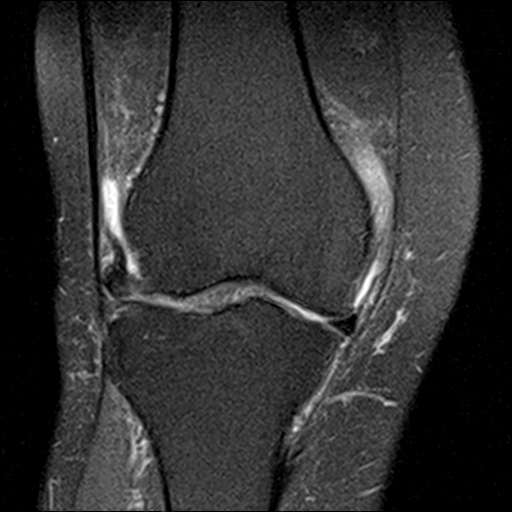
[im 19/28]
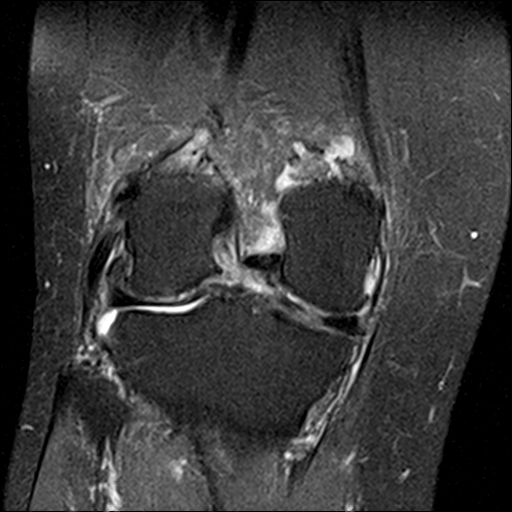
[im 23/28]
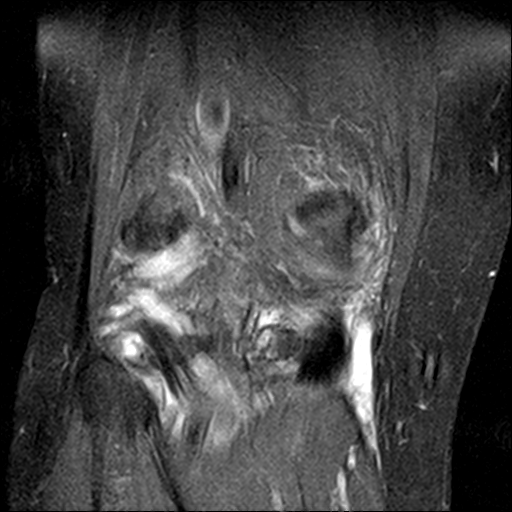
[im 28/28]
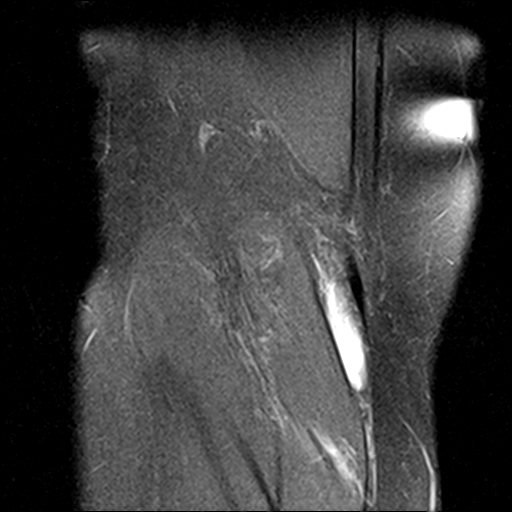

[Series 7: PD fat-sat · sagittal · 3.0mm · 0.29mm/px · 6 of 30 slices shown (2 of 3)]
[im 1/30]
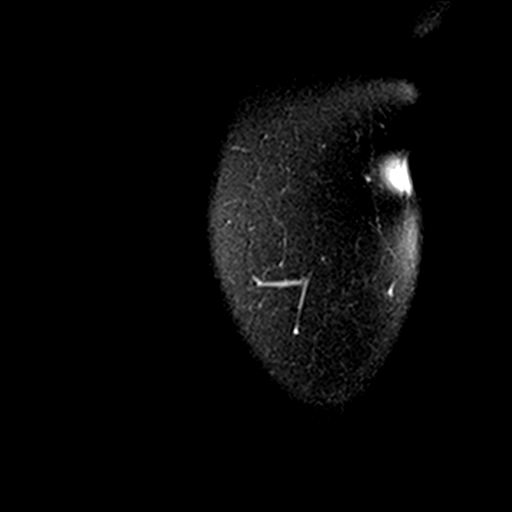
[im 5/30]
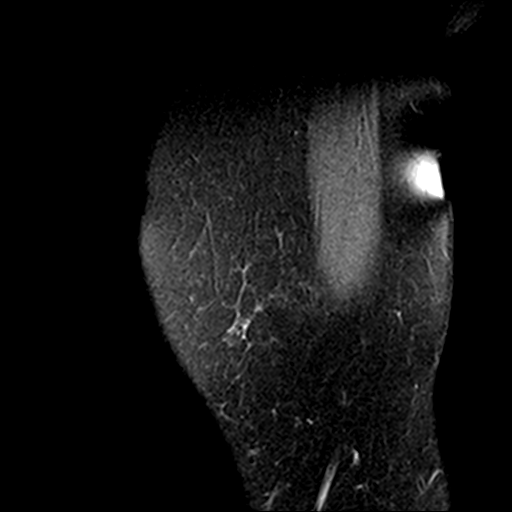
[im 10/30]
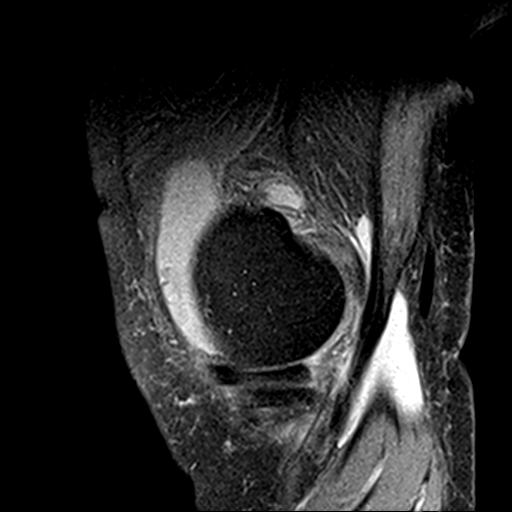
[im 15/30]
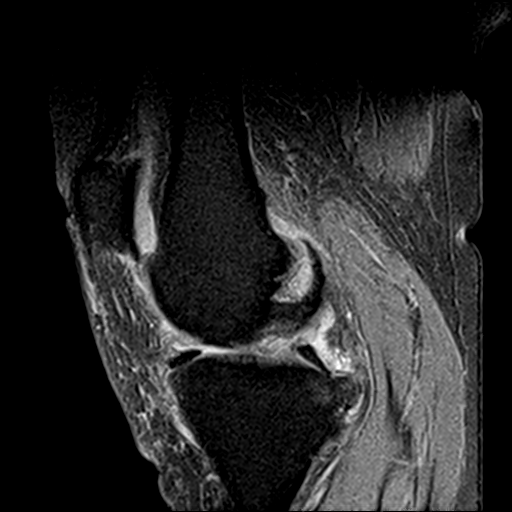
[im 20/30]
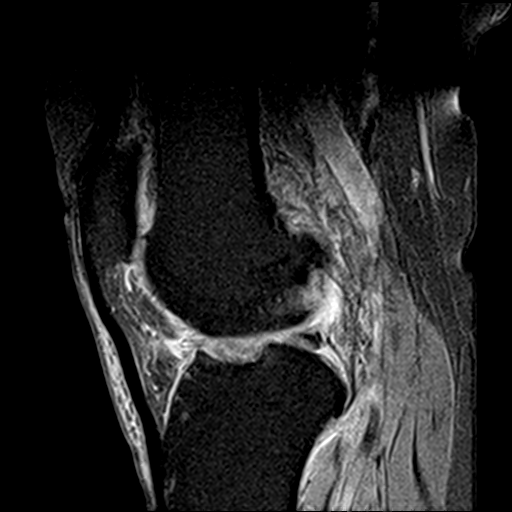
[im 25/30]
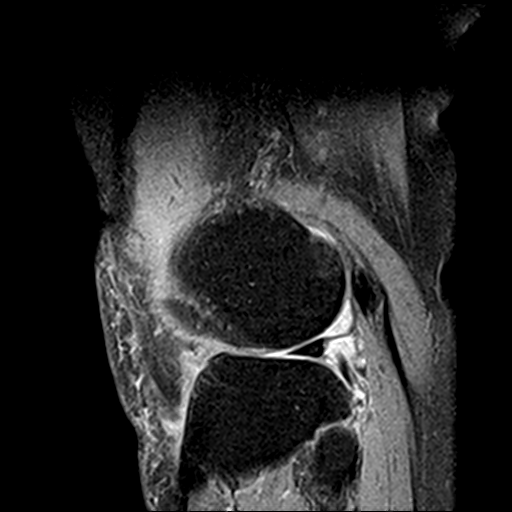

[Series 9: PD fat-sat · coronal · 2.0mm · 0.29mm/px · 3 of 11 slices shown (3 of 3)]
[im 1/11]
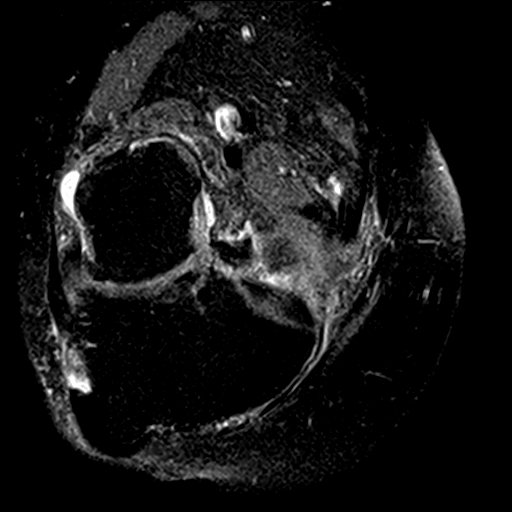
[im 6/11]
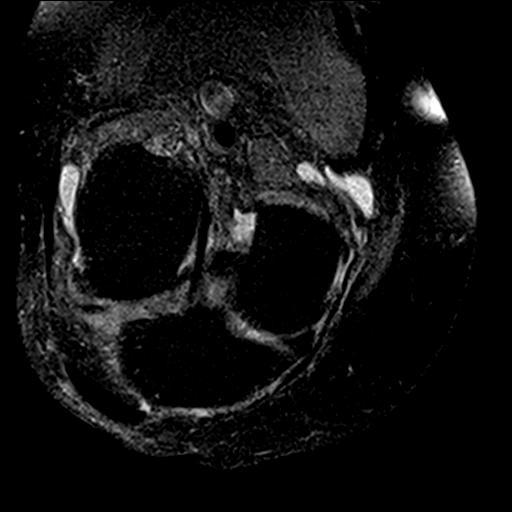
[im 11/11]
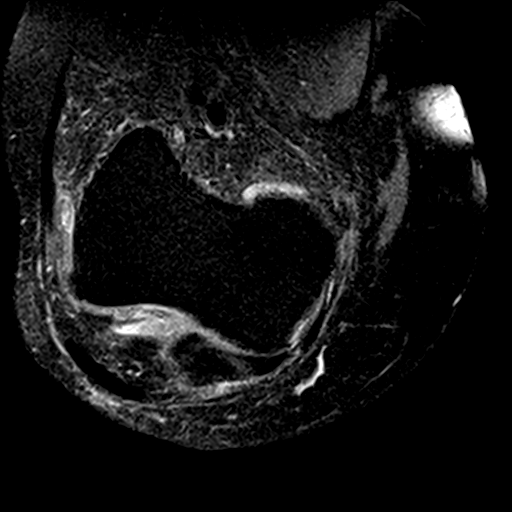

[19 of 40 positions shown; findings below may reference images not displayed]

FINDINGS: MENISCI

Medial meniscus:  Radial tear of the posterior horn.

Lateral meniscus: Completely macerated, essentially absent anterior
horn with extrusion of the body.

LIGAMENTS

Cruciates:  Intact ACL and PCL.

Collaterals: Medial collateral ligament is intact. Lateral
collateral ligament complex is intact.

CARTILAGE

Patellofemoral: High-grade partial and full-thickness cartilage
loss.

Medial:  High-grade partial cartilage loss.

Lateral:  High-grade partial cartilage loss.

Joint: Small to moderate joint effusion. Mild edema in Hoffa's fat.

Popliteal Fossa: Moderate Baker cyst extending into the lower leg,
measuring at least 8.4 cm in craniocaudal dimension. Intact
popliteus tendon.

Extensor Mechanism: Intact quadriceps tendon and patellar tendon.
Intact medial and lateral patellar retinaculum. Intact MPFL.

Bones: No acute fracture or dislocation. No suspicious bone lesion.
Small tricompartmental marginal osteophytes.

Other: None.
IMPRESSION: 1. Radial tear of the medial meniscus posterior horn.
2. Completely macerated, essentially absent anterior horn of the
lateral meniscus with extrusion of the body.
3. Moderate tricompartmental osteoarthritis.
4. Small to moderate joint effusion. Moderate Baker cyst extending
into the lower leg.

## 2020-10-11 ENCOUNTER — Ambulatory Visit: Payer: Federal, State, Local not specified - PPO | Admitting: Orthopedic Surgery

## 2020-10-11 DIAGNOSIS — M1711 Unilateral primary osteoarthritis, right knee: Secondary | ICD-10-CM

## 2020-10-15 ENCOUNTER — Encounter: Payer: Self-pay | Admitting: Orthopedic Surgery

## 2020-10-15 DIAGNOSIS — M1711 Unilateral primary osteoarthritis, right knee: Secondary | ICD-10-CM | POA: Diagnosis not present

## 2020-10-15 MED ORDER — LIDOCAINE HCL 1 % IJ SOLN
5.0000 mL | INTRAMUSCULAR | Status: AC | PRN
  Administered 2020-10-15: 5 mL

## 2020-10-15 MED ORDER — METHYLPREDNISOLONE ACETATE 40 MG/ML IJ SUSP
40.0000 mg | INTRAMUSCULAR | Status: AC | PRN
  Administered 2020-10-15: 40 mg via INTRA_ARTICULAR

## 2020-10-15 MED ORDER — BUPIVACAINE HCL 0.25 % IJ SOLN
4.0000 mL | INTRAMUSCULAR | Status: AC | PRN
  Administered 2020-10-15: 4 mL via INTRA_ARTICULAR

## 2020-10-15 NOTE — Progress Notes (Signed)
Office Visit Note   Patient: Maureen Duncan           Date of Birth: 04/12/1951           MRN: [id number] Visit Date: 10/11/2020 Requested by: Deland Pretty, MD [address] [place],  [place] [address] PCP: Deland Pretty, MD  Subjective: Chief Complaint  Patient presents with  . Right Knee - Pain    HPI: Maureen Duncan is a 69 year old patient with right knee arthritis.  Gel shot 3 months ago did not help.  She has 8 out of 10 pain.  Takes Aleve plus Tylenol arthritis.  She also uses Aspercreme.  She is requesting cortisone injection today.  She wants to try to hold off on total knee replacement as long as possible.              ROS: All systems reviewed are negative as they relate to the chief complaint within the history of present illness.  Patient denies  fevers or chills.   Assessment & Plan: Visit Diagnoses: No diagnosis found.  Plan: Impression is right knee arthritis.  Plan is right knee aspiration injection today.  Come back in 4 months for repeat injection.  Follow-Up Instructions: Return in about 4 months (around 02/09/2021).   Orders:  No orders of the defined types were placed in this encounter.  No orders of the defined types were placed in this encounter.     Procedures: Large Joint Inj: R knee on 10/15/2020 11:09 PM Indications: diagnostic evaluation, joint swelling and pain Details: 18 G 1.5 in needle, superolateral approach  Arthrogram: No  Medications: 5 mL lidocaine 1 %; 40 mg methylPREDNISolone acetate 40 MG/ML; 4 mL bupivacaine 0.25 % Outcome: tolerated well, no immediate complications Procedure, treatment alternatives, risks and benefits explained, specific risks discussed. Consent was given by the patient. Immediately prior to procedure a time out was called to verify the correct patient, procedure, equipment, support staff and site/side marked as required. Patient was prepped and draped in the usual sterile fashion.       Clinical  Data: No additional findings.  Objective: Vital Signs: There were no vitals taken for this visit.  Physical Exam:   Constitutional: Patient appears well-developed HEENT:  Head: Normocephalic Eyes:EOM are normal Neck: Normal range of motion Cardiovascular: Normal rate Pulmonary/chest: Effort normal Neurologic: Patient is alert Skin: Skin is warm Psychiatric: Patient has normal mood and affect    Ortho Exam: Ortho exam demonstrates intact extensor mechanism.  Stable collateral ligaments.  Pedal pulses intact.  Alignment intact.  Patellofemoral crepitus is present.  Range of motion past 90 degrees of flexion.  Specialty Comments:  No specialty comments available.  Imaging: No results found.   PMFS History: Patient Active Problem List   Diagnosis Date Noted  . Rib fractures 11/03/2019  . Multiple trauma 11/02/2019  . Arthritis of knee 04/07/2018  . Unilateral primary osteoarthritis, left knee   . Prolapse of female bladder, acquired 05/08/2015  . Right elbow pain 07/25/2014  . Hyperlipidemia 04/28/2014  . Allergic rhinitis 04/28/2014  . Breast cancer, right. T2, N1. Mastectomy 01/30/2012. 01/09/2012   Past Medical History:  Diagnosis Date  . Arthritis   . Breast cancer [facility]) right breast dx 12/ 2000;  recurrence right breast 01/30/12   oncologist-  dr Marin Olp--  Stage IIb, grade 3, (T2n1M0) ductal carcinoma right breast-ER negative/ HER-2 positive---  s/p  right modified mastectomy with node dissection (3 positive) and chemotherapy (ended aug 2013) and Herceptin ended  Aug 2014  . Cystocele   . GERD (gastroesophageal reflux disease)    occasionally, takes Zantac OTC when needed  . History of DVT (deep vein thrombosis)    left subclavian vein -- port of PAC--  removed 06-23-2012  . Hypertension   . LBBB (left bundle branch block)   . Lymphedema of upper extremity    right arm  . Postmenopausal osteoporosis   . Rib fractures 11/01/2019   fall at home  . Uterine  prolapse   . Wears partial dentures    upper    Family History  Problem Relation Age of Onset  . Diabetes Mother   . Hypertension Mother   . Kidney failure Mother   . Kidney disease Mother   . Cancer Mother        pancreatic  . Other Brother        heart transplant  . Cancer Maternal Uncle        lung    Past Surgical History:  Procedure Laterality Date  . ABDOMINAL HYSTERECTOMY    . BREAST LUMPECTOMY Right Jan 2001  . COLONOSCOPY  last one -- June 2016   WNL  . CYSTOCELE REPAIR N/A 05/08/2015   Procedure: ANTERIOR REPAIR (CYSTOCELE);  Surgeon: Molli Posey, MD;  Location: [facility];  Service: Gynecology;  Laterality: N/A;  . LAPAROSCOPIC ASSISTED VAGINAL HYSTERECTOMY N/A 05/08/2015   Procedure: LAPAROSCOPIC ASSISTED VAGINAL HYSTERECTOMY;  Surgeon: Molli Posey, MD;  Location: [place];  Service: Gynecology;  Laterality: N/A;  . LAPAROSCOPIC BILATERAL SALPINGO OOPHERECTOMY Bilateral 05/08/2015   Procedure: LAPAROSCOPIC BILATERAL SALPINGO OOPHORECTOMY;  Surgeon: Molli Posey, MD;  Location: [place];  Service: Gynecology;  Laterality: Bilateral;  . MODIFIED RADICAL MASTECTOMY W/ AXILLARY LYMPH NODE DISSECTION Right 01/30/12  . PORT-A-CATH REMOVAL  06/23/2012   Procedure: MINOR REMOVAL PORT-A-CATH;  Surgeon: Shann Medal, MD;  Location: [place];  Service: General;  Laterality: Left;  . PORTACATH PLACEMENT  01/30/2012   Procedure: INSERTION PORT-A-CATH;  Surgeon: Shann Medal, MD;  Location: [place];  Service: General;  Laterality: Left;  . TOTAL KNEE ARTHROPLASTY Left 04/07/2018  . TOTAL KNEE ARTHROPLASTY Left 04/07/2018   Procedure: LEFT TOTAL KNEE ARTHROPLASTY;  Surgeon: Meredith Pel, MD;  Location: [place];  Service: Orthopedics;  Laterality: Left;  . TRANSTHORACIC ECHOCARDIOGRAM  08-24-2012   grade I diastolic dysfunction,  ef 45-50%/  trivial MR  . TUBAL LIGATION  1980   Social History    Occupational History  . Not on file  Tobacco Use  . Smoking status: Former Smoker    Packs/day: 0.25    Years: 2.00    Pack years: 0.50    Types: Cigarettes    Start date: 03/31/2010    Quit date: 11/11/2011    Years since quitting: 8.9  . Smokeless tobacco: Never Used  . Tobacco comment: social smoker  Vaping Use  . Vaping Use: Never used  Substance and Sexual Activity  . Alcohol use: Yes    Comment: occasional  . Drug use: No  . Sexual activity: Not on file

## 2020-11-06 DIAGNOSIS — Z1231 Encounter for screening mammogram for malignant neoplasm of breast: Secondary | ICD-10-CM | POA: Diagnosis not present

## 2020-11-24 DIAGNOSIS — D485 Neoplasm of uncertain behavior of skin: Secondary | ICD-10-CM | POA: Diagnosis not present

## 2020-11-24 DIAGNOSIS — B079 Viral wart, unspecified: Secondary | ICD-10-CM | POA: Diagnosis not present

## 2020-12-07 DIAGNOSIS — Z20822 Contact with and (suspected) exposure to covid-19: Secondary | ICD-10-CM | POA: Diagnosis not present

## 2020-12-11 DIAGNOSIS — N8110 Cystocele, unspecified: Secondary | ICD-10-CM | POA: Diagnosis not present

## 2020-12-11 DIAGNOSIS — N760 Acute vaginitis: Secondary | ICD-10-CM | POA: Diagnosis not present

## 2020-12-18 DIAGNOSIS — N819 Female genital prolapse, unspecified: Secondary | ICD-10-CM | POA: Diagnosis not present

## 2020-12-21 DIAGNOSIS — N819 Female genital prolapse, unspecified: Secondary | ICD-10-CM | POA: Diagnosis not present

## 2020-12-29 ENCOUNTER — Encounter: Payer: Self-pay | Admitting: Hematology & Oncology

## 2021-01-08 DIAGNOSIS — N819 Female genital prolapse, unspecified: Secondary | ICD-10-CM | POA: Diagnosis not present

## 2021-01-11 ENCOUNTER — Telehealth: Payer: Self-pay

## 2021-01-11 NOTE — Telephone Encounter (Signed)
Returned pts call as she req to r/s her appt, done    [place]

## 2021-01-24 DIAGNOSIS — N819 Female genital prolapse, unspecified: Secondary | ICD-10-CM | POA: Diagnosis not present

## 2021-02-07 ENCOUNTER — Encounter: Payer: Self-pay | Admitting: Orthopedic Surgery

## 2021-02-07 ENCOUNTER — Ambulatory Visit: Payer: Federal, State, Local not specified - PPO | Admitting: Orthopedic Surgery

## 2021-02-07 DIAGNOSIS — M1711 Unilateral primary osteoarthritis, right knee: Secondary | ICD-10-CM

## 2021-02-07 NOTE — Progress Notes (Signed)
Office Visit Note   Patient: Maureen Duncan           Date of Birth: [birth year]           MRN: [id number] Visit Date: 02/07/2021 Requested by: Deland Pretty, MD [place],  [place] [address] PCP: Deland Pretty, MD  Subjective: Chief Complaint  Patient presents with  . Right Knee - Pain    HPI: Maureen Duncan is a 70 y.o. female who presents to the office complaining of right knee pain.  She has known history of right knee arthritis.  She has been receiving serial cortisone injections.  Gel injections have not provided any relief for her.  Cortisone injections typically last about 3 months.  She wants to avoid any knee replacement as long as possible.  She has had her left knee replaced by Dr. Marlou Sa in June 2019 and is doing well regarding her left knee.  Denies any significant groin pain, radicular pain.  No mechanical locking symptoms or instability though the knee feels like it wants to give out on her on occasion on the right side..                ROS: All systems reviewed are negative as they relate to the chief complaint within the history of present illness.  Patient denies fevers or chills.  Assessment & Plan: Visit Diagnoses:  1. Arthritis of right knee     Plan: Patient is a 70 year old female who presents complaint of right knee pain.  She has history of right knee osteoarthritis.  She wants to avoid knee replacement.  Injections are currently providing good relief of about 3 months.  Right knee was aspirated of small effusion and injected with cortisone today.  She tolerated the procedure well.  Plan to follow-up as needed with next injection to be done at least 3 to 4 months from now.  Patient is not interested in knee replacement at this time  Follow-Up Instructions: No follow-ups on file.   Orders:  No orders of the defined types were placed in this encounter.  No orders of the defined types were placed in this encounter.      Procedures: Large Joint Inj: R knee on 02/07/2021 3:00 PM Indications: diagnostic evaluation, joint swelling and pain Details: 18 G 1.5 in needle, superolateral approach  Arthrogram: No  Medications: 5 mL lidocaine 1 %; 4 mL bupivacaine 0.25 %; 6 mg betamethasone acetate-betamethasone sodium phosphate 6 (3-3) MG/ML Outcome: tolerated well, no immediate complications Procedure, treatment alternatives, risks and benefits explained, specific risks discussed. Consent was given by the patient. Immediately prior to procedure a time out was called to verify the correct patient, procedure, equipment, support staff and site/side marked as required. Patient was prepped and draped in the usual sterile fashion.       Clinical Data: No additional findings.  Objective: Vital Signs: There were no vitals taken for this visit.  Physical Exam:  Constitutional: Patient appears well-developed HEENT:  Head: Normocephalic Eyes:EOM are normal Neck: Normal range of motion Cardiovascular: Normal rate Pulmonary/chest: Effort normal Neurologic: Patient is alert Skin: Skin is warm Psychiatric: Patient has normal mood and affect  Ortho Exam: Ortho exam demonstrates right knee with positive effusion.  No calf tenderness.  Negative Homans' sign.  Tenderness over the medial lateral joint lines.  Able to form straight leg raise.  No pain with hip range of motion.  Specialty Comments:  No specialty comments available.  Imaging: No  results found.   PMFS History: Patient Active Problem List   Diagnosis Date Noted  . Rib fractures 11/03/2019  . Multiple trauma 11/02/2019  . Arthritis of knee 04/07/2018  . Unilateral primary osteoarthritis, left knee   . Prolapse of female bladder, acquired 05/08/2015  . Right elbow pain 07/25/2014  . Hyperlipidemia 04/28/2014  . Allergic rhinitis 04/28/2014  . Breast cancer, right. T2, N1. Mastectomy 01/30/2012. 01/09/2012   Past Medical History:  Diagnosis Date  .  Arthritis   . Breast cancer [facility]) right breast dx 12/ 2000;  recurrence right breast 01/30/12   oncologist-  dr Marin Olp--  Stage IIb, grade 3, (T2n1M0) ductal carcinoma right breast-ER negative/ HER-2 positive---  s/p  right modified mastectomy with node dissection (3 positive) and chemotherapy (ended aug 2013) and Herceptin ended Aug 2014  . Cystocele   . GERD (gastroesophageal reflux disease)    occasionally, takes Zantac OTC when needed  . History of DVT (deep vein thrombosis)    left subclavian vein -- port of PAC--  removed 06-23-2012  . Hypertension   . LBBB (left bundle branch block)   . Lymphedema of upper extremity    right arm  . Postmenopausal osteoporosis   . Rib fractures 11/01/2019   fall at home  . Uterine prolapse   . Wears partial dentures    upper    Family History  Problem Relation Age of Onset  . Diabetes Mother   . Hypertension Mother   . Kidney failure Mother   . Kidney disease Mother   . Cancer Mother        pancreatic  . Other Brother        heart transplant  . Cancer Maternal Uncle        lung    Past Surgical History:  Procedure Laterality Date  . ABDOMINAL HYSTERECTOMY    . BREAST LUMPECTOMY Right Jan 2001  . COLONOSCOPY  last one -- June 2016   WNL  . CYSTOCELE REPAIR N/A 05/08/2015   Procedure: ANTERIOR REPAIR (CYSTOCELE);  Surgeon: Molli Posey, MD;  Location: [facility];  Service: Gynecology;  Laterality: N/A;  . LAPAROSCOPIC ASSISTED VAGINAL HYSTERECTOMY N/A 05/08/2015   Procedure: LAPAROSCOPIC ASSISTED VAGINAL HYSTERECTOMY;  Surgeon: Molli Posey, MD;  Location: [place];  Service: Gynecology;  Laterality: N/A;  . LAPAROSCOPIC BILATERAL SALPINGO OOPHERECTOMY Bilateral 05/08/2015   Procedure: LAPAROSCOPIC BILATERAL SALPINGO OOPHORECTOMY;  Surgeon: Molli Posey, MD;  Location: [place];  Service: Gynecology;  Laterality: Bilateral;  . MODIFIED RADICAL MASTECTOMY W/ AXILLARY LYMPH NODE  DISSECTION Right 01/30/12  . PORT-A-CATH REMOVAL  06/23/2012   Procedure: MINOR REMOVAL PORT-A-CATH;  Surgeon: Shann Medal, MD;  Location: [place];  Service: General;  Laterality: Left;  . PORTACATH PLACEMENT  01/30/2012   Procedure: INSERTION PORT-A-CATH;  Surgeon: Shann Medal, MD;  Location: [place];  Service: General;  Laterality: Left;  . TOTAL KNEE ARTHROPLASTY Left 04/07/2018  . TOTAL KNEE ARTHROPLASTY Left 04/07/2018   Procedure: LEFT TOTAL KNEE ARTHROPLASTY;  Surgeon: Meredith Pel, MD;  Location: [place];  Service: Orthopedics;  Laterality: Left;  . TRANSTHORACIC ECHOCARDIOGRAM  08-24-2012   grade I diastolic dysfunction,  ef 45-50%/  trivial MR  . TUBAL LIGATION  1980   Social History   Occupational History  . Not on file  Tobacco Use  . Smoking status: Former Smoker    Packs/day: 0.25    Years: 2.00    Pack years: 0.50  Types: Cigarettes    Start date: 03/31/2010    Quit date: 11/11/2011    Years since quitting: 9.2  . Smokeless tobacco: Never Used  . Tobacco comment: social smoker  Vaping Use  . Vaping Use: Never used  Substance and Sexual Activity  . Alcohol use: Yes    Comment: occasional  . Drug use: No  . Sexual activity: Not on file

## 2021-02-09 MED ORDER — LIDOCAINE HCL 1 % IJ SOLN
5.0000 mL | INTRAMUSCULAR | Status: AC | PRN
  Administered 2021-02-07: 5 mL

## 2021-02-09 MED ORDER — BUPIVACAINE HCL 0.25 % IJ SOLN
4.0000 mL | INTRAMUSCULAR | Status: AC | PRN
  Administered 2021-02-07: 4 mL via INTRA_ARTICULAR

## 2021-02-09 MED ORDER — BETAMETHASONE SOD PHOS & ACET 6 (3-3) MG/ML IJ SUSP
6.0000 mg | INTRAMUSCULAR | Status: AC | PRN
  Administered 2021-02-07: 6 mg via INTRA_ARTICULAR

## 2021-02-21 ENCOUNTER — Ambulatory Visit: Payer: Federal, State, Local not specified - PPO | Admitting: Hematology & Oncology

## 2021-02-21 ENCOUNTER — Other Ambulatory Visit: Payer: Federal, State, Local not specified - PPO

## 2021-02-21 DIAGNOSIS — Z1211 Encounter for screening for malignant neoplasm of colon: Secondary | ICD-10-CM | POA: Diagnosis not present

## 2021-02-21 DIAGNOSIS — K5730 Diverticulosis of large intestine without perforation or abscess without bleeding: Secondary | ICD-10-CM | POA: Diagnosis not present

## 2021-02-21 DIAGNOSIS — D122 Benign neoplasm of ascending colon: Secondary | ICD-10-CM | POA: Diagnosis not present

## 2021-02-23 ENCOUNTER — Inpatient Hospital Stay (HOSPITAL_BASED_OUTPATIENT_CLINIC_OR_DEPARTMENT_OTHER): Payer: Federal, State, Local not specified - PPO | Admitting: Hematology & Oncology

## 2021-02-23 ENCOUNTER — Inpatient Hospital Stay: Payer: Federal, State, Local not specified - PPO | Attending: Hematology & Oncology

## 2021-02-23 ENCOUNTER — Other Ambulatory Visit: Payer: Self-pay

## 2021-02-23 VITALS — BP 113/70 | HR 78 | Temp 98.2°F | Resp 17 | Ht 65.0 in | Wt 168.0 lb

## 2021-02-23 DIAGNOSIS — C50011 Malignant neoplasm of nipple and areola, right female breast: Secondary | ICD-10-CM

## 2021-02-23 DIAGNOSIS — Z9221 Personal history of antineoplastic chemotherapy: Secondary | ICD-10-CM | POA: Insufficient documentation

## 2021-02-23 DIAGNOSIS — Z853 Personal history of malignant neoplasm of breast: Secondary | ICD-10-CM | POA: Diagnosis not present

## 2021-02-23 DIAGNOSIS — Z171 Estrogen receptor negative status [ER-]: Secondary | ICD-10-CM | POA: Insufficient documentation

## 2021-02-23 LAB — CBC WITH DIFFERENTIAL (CANCER CENTER ONLY)
Abs Immature Granulocytes: 0.01 10*3/uL (ref 0.00–0.07)
Basophils Absolute: 0 10*3/uL (ref 0.0–0.1)
Basophils Relative: 1 %
Eosinophils Absolute: 0.1 10*3/uL (ref 0.0–0.5)
Eosinophils Relative: 1 %
HCT: 38.2 % (ref 36.0–46.0)
Hemoglobin: 12.5 g/dL (ref 12.0–15.0)
Immature Granulocytes: 0 %
Lymphocytes Relative: 28 %
Lymphs Abs: 1.5 10*3/uL (ref 0.7–4.0)
MCH: 31.1 pg (ref 26.0–34.0)
MCHC: 32.7 g/dL (ref 30.0–36.0)
MCV: 95 fL (ref 80.0–100.0)
Monocytes Absolute: 0.5 10*3/uL (ref 0.1–1.0)
Monocytes Relative: 10 %
Neutro Abs: 3.2 10*3/uL (ref 1.7–7.7)
Neutrophils Relative %: 60 %
Platelet Count: 251 10*3/uL (ref 150–400)
RBC: 4.02 MIL/uL (ref 3.87–5.11)
RDW: 12.8 % (ref 11.5–15.5)
WBC Count: 5.4 10*3/uL (ref 4.0–10.5)
nRBC: 0 % (ref 0.0–0.2)

## 2021-02-23 LAB — IRON AND TIBC
Iron: 77 ug/dL (ref 28–170)
Saturation Ratios: 24 % (ref 10.4–31.8)
TIBC: 323 ug/dL (ref 250–450)
UIBC: 246 ug/dL

## 2021-02-23 LAB — CMP (CANCER CENTER ONLY)
ALT: 14 U/L (ref 0–44)
AST: 18 U/L (ref 15–41)
Albumin: 4.2 g/dL (ref 3.5–5.0)
Alkaline Phosphatase: 50 U/L (ref 38–126)
Anion gap: 9 (ref 5–15)
BUN: 12 mg/dL (ref 8–23)
CO2: 27 mmol/L (ref 22–32)
Calcium: 10.3 mg/dL (ref 8.9–10.3)
Chloride: 103 mmol/L (ref 98–111)
Creatinine: 0.63 mg/dL (ref 0.44–1.00)
GFR, Estimated: >60 mL/min (ref >60)
Glucose, Bld: 103 mg/dL — ABNORMAL HIGH (ref 70–99)
Potassium: 3.4 mmol/L — ABNORMAL LOW (ref 3.5–5.1)
Sodium: 139 mmol/L (ref 135–145)
Total Bilirubin: 0.4 mg/dL (ref 0.3–1.2)
Total Protein: 7 g/dL (ref 6.5–8.1)

## 2021-02-23 LAB — LACTATE DEHYDROGENASE: LDH: 143 U/L (ref 98–192)

## 2021-02-23 LAB — FERRITIN: Ferritin: 347 ng/mL — ABNORMAL HIGH (ref 11–307)

## 2021-02-23 NOTE — Progress Notes (Signed)
Hematology and Oncology Follow Up Visit  Maureen Duncan [id number] [birth year] 70 y.o. 02/23/2021   Principle Diagnosis:  Stage IIb (T2N1M0) ductal carcinoma of the right breast-ER negative/HER-2 positive History of DVT of the left subclavian vein  Current Therapy:    Aspirin 162 mg by mouth daily     Interim History:  Maureen Duncan is back for followup.  We see her every 6 months.  She is doing okay.  Her grandson's is still down in [place].  His mom, Maureen Duncan's daughter, has a new job.  She does go down on occasion to help out.  He is in high school.  He is a very good musician.  She had a colonoscopy recently.  Apparently a polyp was found.  I would have to imagine that this is not going to be malignant.  She has had no problems with her appetite.  She has had no nausea or vomiting.  She has had no issues with rashes.  There is been no leg swelling.  She has had no headache.  There is been no problems with the COVID.    Overall, I would say performance status is ECOG 1.      Medications:  Current Outpatient Medications:  .  acetaminophen (TYLENOL) 500 MG tablet, Take 2 tablets (1,000 mg total) by mouth every 6 (six) hours., Disp: 30 tablet, Rfl: 0 .  amLODipine-benazepril (LOTREL) 5-20 MG capsule, TAKE 1 CAPSULE BY MOUTH EVERY DAY, Disp: 30 capsule, Rfl: 4 .  amoxicillin (AMOXIL) 500 MG tablet, TAKE 4 TABLETS BY MOUTH 1 HOUR PRIOR TO DENTAL PROCEDURE, Disp: 10 tablet, Rfl: 0 .  atorvastatin (LIPITOR) 40 MG tablet, Take 40 mg by mouth daily., Disp: , Rfl:  .  diphenhydrAMINE (BENADRYL) 25 MG tablet, Take 50 mg by mouth daily as needed for itching or allergies. , Disp: , Rfl:  .  diphenhydramine-acetaminophen (TYLENOL PM) 25-500 MG TABS, Take 1 tablet by mouth at bedtime as needed (sleep). , Disp: , Rfl:  .  docusate sodium (COLACE) 100 MG capsule, Take 1 capsule (100 mg total) by mouth 2 (two) times daily. (Patient taking differently: Take 200 mg by mouth daily as needed for mild  constipation or moderate constipation.), Disp: 10 capsule, Rfl: 0 .  fluticasone (FLONASE) 50 MCG/ACT nasal spray, PLACE 2 SPRAYS INTO BOTH NOSTRILS AS NEEDED., Disp: 16 mL, Rfl: 3 .  magnesium citrate SOLN, Take 1 Bottle by mouth every 30 (thirty) days., Disp: , Rfl:  .  metroNIDAZOLE (FLAGYL) 500 MG tablet, metronidazole 500 mg tablet  TAKE 1 TABLET TWICE A DAY BY ORAL ROUTE AROUND THE CLOCK FOR 7 DAYS., Disp: , Rfl:  .  metroNIDAZOLE (METROGEL) 0.75 % vaginal gel, metronidazole 0.75 % vaginal gel  INSERT 1 APPLICATORFUL VAGINALLY EVERY DAY FOR 5 DAYS, Disp: , Rfl:  .  polyethylene glycol-electrolytes (NULYTELY) 420 g solution, peg-electrolyte solution 420 gram oral solution, Disp: , Rfl:  .  traMADol (ULTRAM) 50 MG tablet, Take 1 tablet (50 mg total) by mouth every 6 (six) hours as needed (mild pain)., Disp: 30 tablet, Rfl: 0 .  triamcinolone cream (KENALOG) 0.1 %, Apply 1 application topically daily as needed (rash). , Disp: , Rfl:  .  Vitamin D, Ergocalciferol, (DRISDOL) 1.25 MG (50000 UNIT) CAPS capsule, TAKE 1 CAPSULE (50,000 UNITS TOTAL) BY MOUTH 2 (TWO) TIMES A WEEK., Disp: 24 capsule, Rfl: 3 No current facility-administered medications for this visit.  Facility-Administered Medications Ordered in Other Visits:  .  fentaNYL (SUBLIMAZE) injection, , ,  Anesthesia Intra-op, Jenne Campus, CRNA, 50 mcg at 02/24/18 3474 .  lactated ringers infusion, , , Continuous PRN, Jenne Campus, CRNA, [place] Bag at 04/07/18 [phone] .  midazolam (VERSED) 5 MG/5ML injection, , , Anesthesia Intra-op, Jenne Campus, CRNA, 2 mg at 02/24/18 0703 .  sodium chloride 0.9 % injection 10 mL, 10 mL, Intravenous, PRN, Volanda Napoleon, MD, 10 mL at 06/17/12 1651  Allergies:  Allergies  Allergen Reactions  . Meloxicam     Pt states "gives me a weird feeling"  "makes my heart race and makes me anxious"   . Adhesive [Tape] Rash    Past Medical History, Surgical history, Social history, and Family History were  reviewed and updated.  Review of Systems: Review of Systems  Constitutional: Negative.   HENT: Negative.   Eyes: Negative.   Respiratory: Negative.   Cardiovascular: Negative.   Gastrointestinal: Negative.   Genitourinary: Negative.   Musculoskeletal: Negative.   Skin: Negative.   Neurological: Negative.   Endo/Heme/Allergies: Negative.   Psychiatric/Behavioral: Negative.     Physical Exam:  height is 5' 5" (1.651 m) and weight is 168 lb 0.6 oz (76.2 kg). Her oral temperature is 98.2 F (36.8 C). Her blood pressure is 113/70 and her pulse is 78. Her respiration is 17 and oxygen saturation is 99%.   Physical Exam Vitals reviewed.  Constitutional:      Comments: Breast exam shows left breast with no masses, edema or erythema.  There is no left nipple discharge.  There is no left axillary adenopathy.  Right chest wall shows well-healed mastectomy.  There is no right chest wall nodules.  There is no right chest wall erythema.  There is no right axillary adenopathy.  HENT:     Head: Normocephalic and atraumatic.  Eyes:     Pupils: Pupils are equal, round, and reactive to light.  Cardiovascular:     Rate and Rhythm: Normal rate and regular rhythm.     Heart sounds: Normal heart sounds.  Pulmonary:     Effort: Pulmonary effort is normal.     Breath sounds: Normal breath sounds.  Abdominal:     General: Bowel sounds are normal.     Palpations: Abdomen is soft.  Musculoskeletal:        General: No tenderness or deformity. Normal range of motion.     Cervical back: Normal range of motion.  Lymphadenopathy:     Cervical: No cervical adenopathy.  Skin:    General: Skin is warm and dry.     Findings: No erythema or rash.  Neurological:     Mental Status: She is alert and oriented to person, place, and time.  Psychiatric:        Behavior: Behavior normal.        Thought Content: Thought content normal.        Judgment: Judgment normal.      Lab Results  Component Value  Date   WBC 5.4 02/23/2021   HGB 12.5 02/23/2021   HCT 38.2 02/23/2021   MCV 95.0 02/23/2021   PLT 251 02/23/2021     Chemistry      Component Value Date/Time   NA 139 02/23/2021 1158   NA 141 07/28/2017 0911   NA 142 01/27/2017 0849   K 3.4 (L) 02/23/2021 1158   K 3.8 07/28/2017 0911   K 4.3 01/27/2017 0849   CL 103 02/23/2021 1158   CL 105 07/28/2017 0911   CO2 27 02/23/2021 1158  CO2 29 07/28/2017 0911   CO2 29 01/27/2017 0849   BUN 12 02/23/2021 1158   BUN 13 07/28/2017 0911   BUN 16.9 01/27/2017 0849   CREATININE 0.63 02/23/2021 1158   CREATININE 0.8 07/28/2017 0911   CREATININE 0.8 01/27/2017 0849      Component Value Date/Time   CALCIUM 10.3 02/23/2021 1158   CALCIUM 10.0 07/28/2017 0911   CALCIUM 10.5 (H) 01/27/2017 0849   ALKPHOS 50 02/23/2021 1158   ALKPHOS 64 07/28/2017 0911   ALKPHOS 67 01/27/2017 0849   AST 18 02/23/2021 1158   AST 20 01/27/2017 0849   ALT 14 02/23/2021 1158   ALT 19 07/28/2017 0911   ALT 16 01/27/2017 0849   BILITOT 0.4 02/23/2021 1158   BILITOT 0.53 01/27/2017 0849      Impression and Plan: Maureen Duncan is 70 year old African female. She has a history of stage IIb ductal carcinoma of the right breast. Her tumor was HER-2 positive. She was treated with systemic chemotherapy followed by a year of Herceptin. She completed this in August of 2014.  Of note, she had 3 positive lymph nodes. She did receive 6 cycles of chemotherapy with Taxotere and carboplatinum.  I do not see any evidence of recurrent disease.  I realize that she certainly is at risk for recurrence.  I am just so happy that her overall quality of life is doing well.  She still wishes that her grandson was up here.  Maybe when he goes to college, he will go to [place].  We will go ahead and plan to get her back in another 6 months.   Volanda Napoleon, MD 4/29/202212:37 PM

## 2021-02-26 ENCOUNTER — Telehealth: Payer: Self-pay

## 2021-02-26 DIAGNOSIS — N819 Female genital prolapse, unspecified: Secondary | ICD-10-CM | POA: Diagnosis not present

## 2021-02-26 NOTE — Telephone Encounter (Signed)
S/w pt per 02/23/21 los and a calendar has been mailed to the pt   Maureen Duncan

## 2021-03-13 ENCOUNTER — Other Ambulatory Visit: Payer: Self-pay | Admitting: Hematology & Oncology

## 2021-03-13 DIAGNOSIS — E559 Vitamin D deficiency, unspecified: Secondary | ICD-10-CM

## 2021-04-11 ENCOUNTER — Telehealth: Payer: Self-pay

## 2021-04-25 ENCOUNTER — Other Ambulatory Visit: Payer: Self-pay | Admitting: Hematology & Oncology

## 2021-06-11 DIAGNOSIS — Z01419 Encounter for gynecological examination (general) (routine) without abnormal findings: Secondary | ICD-10-CM | POA: Diagnosis not present

## 2021-08-01 ENCOUNTER — Telehealth: Payer: Self-pay | Admitting: Hematology & Oncology

## 2021-08-01 NOTE — Telephone Encounter (Signed)
Spoke with patient about appointments for 11/8 needed to be changed.  She was ok with new date & time

## 2021-08-03 DIAGNOSIS — C50911 Malignant neoplasm of unspecified site of right female breast: Secondary | ICD-10-CM | POA: Diagnosis not present

## 2021-08-09 DIAGNOSIS — N760 Acute vaginitis: Secondary | ICD-10-CM | POA: Diagnosis not present

## 2021-08-09 DIAGNOSIS — N8111 Cystocele, midline: Secondary | ICD-10-CM | POA: Diagnosis not present

## 2021-08-17 ENCOUNTER — Ambulatory Visit: Payer: Federal, State, Local not specified - PPO | Admitting: Hematology & Oncology

## 2021-08-17 ENCOUNTER — Other Ambulatory Visit: Payer: Federal, State, Local not specified - PPO

## 2021-08-22 DIAGNOSIS — C50911 Malignant neoplasm of unspecified site of right female breast: Secondary | ICD-10-CM | POA: Diagnosis not present

## 2021-08-24 ENCOUNTER — Other Ambulatory Visit: Payer: Federal, State, Local not specified - PPO

## 2021-08-24 ENCOUNTER — Ambulatory Visit: Payer: Federal, State, Local not specified - PPO | Admitting: Hematology & Oncology

## 2021-09-04 ENCOUNTER — Other Ambulatory Visit: Payer: Federal, State, Local not specified - PPO

## 2021-09-04 ENCOUNTER — Ambulatory Visit: Payer: Federal, State, Local not specified - PPO | Admitting: Hematology & Oncology

## 2021-09-09 ENCOUNTER — Other Ambulatory Visit: Payer: Self-pay | Admitting: Hematology & Oncology

## 2021-09-18 ENCOUNTER — Inpatient Hospital Stay: Payer: Federal, State, Local not specified - PPO | Admitting: Hematology & Oncology

## 2021-09-18 ENCOUNTER — Inpatient Hospital Stay: Payer: Federal, State, Local not specified - PPO | Attending: Hematology & Oncology

## 2021-09-18 ENCOUNTER — Other Ambulatory Visit: Payer: Self-pay

## 2021-09-18 ENCOUNTER — Encounter: Payer: Self-pay | Admitting: Hematology & Oncology

## 2021-09-18 VITALS — BP 135/87 | HR 75 | Temp 98.5°F | Resp 18 | Wt 172.0 lb

## 2021-09-18 DIAGNOSIS — Z9221 Personal history of antineoplastic chemotherapy: Secondary | ICD-10-CM | POA: Insufficient documentation

## 2021-09-18 DIAGNOSIS — Z171 Estrogen receptor negative status [ER-]: Secondary | ICD-10-CM | POA: Insufficient documentation

## 2021-09-18 DIAGNOSIS — C50011 Malignant neoplasm of nipple and areola, right female breast: Secondary | ICD-10-CM

## 2021-09-18 DIAGNOSIS — Z853 Personal history of malignant neoplasm of breast: Secondary | ICD-10-CM | POA: Insufficient documentation

## 2021-09-18 DIAGNOSIS — Z7982 Long term (current) use of aspirin: Secondary | ICD-10-CM | POA: Insufficient documentation

## 2021-09-18 DIAGNOSIS — Z86718 Personal history of other venous thrombosis and embolism: Secondary | ICD-10-CM | POA: Diagnosis not present

## 2021-09-18 DIAGNOSIS — Z9011 Acquired absence of right breast and nipple: Secondary | ICD-10-CM | POA: Insufficient documentation

## 2021-09-18 LAB — CMP (CANCER CENTER ONLY)
ALT: 13 U/L (ref 0–44)
AST: 17 U/L (ref 15–41)
Albumin: 4.1 g/dL (ref 3.5–5.0)
Alkaline Phosphatase: 54 U/L (ref 38–126)
Anion gap: 7 (ref 5–15)
BUN: 14 mg/dL (ref 8–23)
CO2: 29 mmol/L (ref 22–32)
Calcium: 11.4 mg/dL — ABNORMAL HIGH (ref 8.9–10.3)
Chloride: 102 mmol/L (ref 98–111)
Creatinine: 0.74 mg/dL (ref 0.44–1.00)
GFR, Estimated: >60 mL/min (ref >60)
Glucose, Bld: 102 mg/dL — ABNORMAL HIGH (ref 70–99)
Potassium: 3.9 mmol/L (ref 3.5–5.1)
Sodium: 138 mmol/L (ref 135–145)
Total Bilirubin: 0.5 mg/dL (ref 0.3–1.2)
Total Protein: 7 g/dL (ref 6.5–8.1)

## 2021-09-18 LAB — CBC WITH DIFFERENTIAL (CANCER CENTER ONLY)
Abs Immature Granulocytes: 0.02 10*3/uL (ref 0.00–0.07)
Basophils Absolute: 0 10*3/uL (ref 0.0–0.1)
Basophils Relative: 1 %
Eosinophils Absolute: 0.1 10*3/uL (ref 0.0–0.5)
Eosinophils Relative: 2 %
HCT: 38.5 % (ref 36.0–46.0)
Hemoglobin: 12.5 g/dL (ref 12.0–15.0)
Immature Granulocytes: 0 %
Lymphocytes Relative: 26 %
Lymphs Abs: 1.3 10*3/uL (ref 0.7–4.0)
MCH: 31.1 pg (ref 26.0–34.0)
MCHC: 32.5 g/dL (ref 30.0–36.0)
MCV: 95.8 fL (ref 80.0–100.0)
Monocytes Absolute: 0.4 10*3/uL (ref 0.1–1.0)
Monocytes Relative: 9 %
Neutro Abs: 3.2 10*3/uL (ref 1.7–7.7)
Neutrophils Relative %: 62 %
Platelet Count: 253 10*3/uL (ref 150–400)
RBC: 4.02 MIL/uL (ref 3.87–5.11)
RDW: 13.1 % (ref 11.5–15.5)
WBC Count: 5.1 10*3/uL (ref 4.0–10.5)
nRBC: 0 % (ref 0.0–0.2)

## 2021-09-18 LAB — LACTATE DEHYDROGENASE: LDH: 145 U/L (ref 98–192)

## 2021-09-18 NOTE — Progress Notes (Signed)
Hematology and Oncology Follow Up Visit  Maureen Duncan [id number] [birth year] 70 y.o. 09/18/2021   Principle Diagnosis:  Stage IIb (T2N1M0) ductal carcinoma of the right breast-ER negative/HER-2 positive History of DVT of the left subclavian vein  Current Therapy:   Aspirin 162 mg by mouth daily     Interim History:  Maureen Duncan is back for followup.  We see her every 6 months.  She had a very nice summer.  She is able to spend some it with her grandson, who lives down in [place].  She will have a nice Thanksgiving.  She will be with her daughter.  She is still working.  She would like to retire if she could.  She has had no problems with COVID.  There may be a little bit of swelling in the right hand.  I am not too worried about this.  She may have little bit of lymphedema.  She has had no problems with cough or shortness of breath.  There is been no change in bowel or bladder habits.  She has had no rashes.  There is been no leg swelling.  Her next mammogram is due in January.  Overall, I would have to say that her performance status is ECOG 0.     Medications:  Current Outpatient Medications:    MELATONIN PO, Take by mouth as needed., Disp: , Rfl:    acetaminophen (TYLENOL) 500 MG tablet, Take 2 tablets (1,000 mg total) by mouth every 6 (six) hours., Disp: 30 tablet, Rfl: 0   amLODipine-benazepril (LOTREL) 5-20 MG capsule, TAKE 1 CAPSULE BY MOUTH EVERY DAY, Disp: 30 capsule, Rfl: 4   amoxicillin (AMOXIL) 500 MG tablet, TAKE 4 TABLETS BY MOUTH 1 HOUR PRIOR TO DENTAL PROCEDURE, Disp: 10 tablet, Rfl: 0   atorvastatin (LIPITOR) 40 MG tablet, Take 40 mg by mouth daily., Disp: , Rfl:    diphenhydrAMINE (BENADRYL) 25 MG tablet, Take 50 mg by mouth daily as needed for itching or allergies. , Disp: , Rfl:    diphenhydramine-acetaminophen (TYLENOL PM) 25-500 MG TABS, Take 1 tablet by mouth at bedtime as needed (sleep). , Disp: , Rfl:    docusate sodium (COLACE) 100 MG capsule, Take 1  capsule (100 mg total) by mouth 2 (two) times daily. (Patient taking differently: Take 200 mg by mouth daily as needed for mild constipation or moderate constipation.), Disp: 10 capsule, Rfl: 0   fluticasone (FLONASE) 50 MCG/ACT nasal spray, SPRAY 2 SPRAYS INTO EACH NOSTRIL EVERY DAY AS NEEDED, Disp: 16 mL, Rfl: 3   magnesium citrate SOLN, Take 1 Bottle by mouth every 30 (thirty) days., Disp: , Rfl:    metroNIDAZOLE (METROGEL) 0.75 % vaginal gel, metronidazole 0.75 % vaginal gel  INSERT 1 APPLICATORFUL VAGINALLY EVERY DAY FOR 5 DAYS, Disp: , Rfl:    triamcinolone cream (KENALOG) 0.1 %, Apply 1 application topically daily as needed (rash). , Disp: , Rfl:    Vitamin D, Ergocalciferol, (DRISDOL) 1.25 MG (50000 UNIT) CAPS capsule, TAKE 1 CAPSULE BY MOUTH 2 TIMES A WEEK, Disp: 24 capsule, Rfl: 3 No current facility-administered medications for this visit.  Facility-Administered Medications Ordered in Other Visits:    fentaNYL (SUBLIMAZE) injection, , , Anesthesia Intra-op, Jenne Campus, CRNA, 50 mcg at 02/24/18 7473   lactated ringers infusion, , , Continuous PRN, Jenne Campus, CRNA, [place] Bag at 04/07/18 4037   midazolam (VERSED) 5 MG/5ML injection, , , Anesthesia Intra-op, Jenne Campus, CRNA, 2 mg at 02/24/18 [phone]  sodium chloride 0.9 % injection 10 mL, 10 mL, Intravenous, PRN, Volanda Napoleon, MD, 10 mL at 06/17/12 1651  Allergies:  Allergies  Allergen Reactions   Meloxicam     Pt states "gives me a weird feeling"  "makes my heart race and makes me anxious"  Other reaction(s): Tachycardia   Adhesive [Tape] Rash    Past Medical History, Surgical history, Social history, and Family History were reviewed and updated.  Review of Systems: Review of Systems  Constitutional: Negative.   HENT: Negative.    Eyes: Negative.   Respiratory: Negative.    Cardiovascular: Negative.   Gastrointestinal: Negative.   Genitourinary: Negative.   Musculoskeletal: Negative.   Skin: Negative.    Neurological: Negative.   Endo/Heme/Allergies: Negative.   Psychiatric/Behavioral: Negative.     Physical Exam:  weight is 172 lb (78 kg). Her oral temperature is 98.5 F (36.9 C). Her blood pressure is 135/87 and her pulse is 75. Her respiration is 18 and oxygen saturation is 97%.   Physical Exam Vitals reviewed.  Constitutional:      Comments: Breast exam shows left breast with no masses, edema or erythema.  There is no left nipple discharge.  There is no left axillary adenopathy.  Right chest wall shows well-healed mastectomy.  There is no right chest wall nodules.  There is no right chest wall erythema.  There is no right axillary adenopathy.  HENT:     Head: Normocephalic and atraumatic.  Eyes:     Pupils: Pupils are equal, round, and reactive to light.  Cardiovascular:     Rate and Rhythm: Normal rate and regular rhythm.     Heart sounds: Normal heart sounds.  Pulmonary:     Effort: Pulmonary effort is normal.     Breath sounds: Normal breath sounds.  Abdominal:     General: Bowel sounds are normal.     Palpations: Abdomen is soft.  Musculoskeletal:        General: No tenderness or deformity. Normal range of motion.     Cervical back: Normal range of motion.  Lymphadenopathy:     Cervical: No cervical adenopathy.  Skin:    General: Skin is warm and dry.     Findings: No erythema or rash.  Neurological:     Mental Status: She is alert and oriented to person, place, and time.  Psychiatric:        Behavior: Behavior normal.        Thought Content: Thought content normal.        Judgment: Judgment normal.     Lab Results  Component Value Date   WBC 5.1 09/18/2021   HGB 12.5 09/18/2021   HCT 38.5 09/18/2021   MCV 95.8 09/18/2021   PLT 253 09/18/2021     Chemistry      Component Value Date/Time   NA 138 09/18/2021 0818   NA 141 07/28/2017 0911   NA 142 01/27/2017 0849   K 3.9 09/18/2021 0818   K 3.8 07/28/2017 0911   K 4.3 01/27/2017 0849   CL 102  09/18/2021 0818   CL 105 07/28/2017 0911   CO2 29 09/18/2021 0818   CO2 29 07/28/2017 0911   CO2 29 01/27/2017 0849   BUN 14 09/18/2021 0818   BUN 13 07/28/2017 0911   BUN 16.9 01/27/2017 0849   CREATININE 0.74 09/18/2021 0818   CREATININE 0.8 07/28/2017 0911   CREATININE 0.8 01/27/2017 0849      Component Value Date/Time  CALCIUM 11.4 (H) 09/18/2021 0818   CALCIUM 10.0 07/28/2017 0911   CALCIUM 10.5 (H) 01/27/2017 0849   ALKPHOS 54 09/18/2021 0818   ALKPHOS 64 07/28/2017 0911   ALKPHOS 67 01/27/2017 0849   AST 17 09/18/2021 0818   AST 20 01/27/2017 0849   ALT 13 09/18/2021 0818   ALT 19 07/28/2017 0911   ALT 16 01/27/2017 0849   BILITOT 0.5 09/18/2021 0818   BILITOT 0.53 01/27/2017 0849      Impression and Plan: Ms. Fabio is 70 year old African female. She has a history of stage IIb ductal carcinoma of the right breast. Her tumor was HER-2 positive. She was treated with systemic chemotherapy followed by a year of Herceptin. She completed this in August of 2014.  Of note, she had 3 positive lymph nodes. She did receive 6 cycles of chemotherapy with Taxotere and carboplatinum.  I do not see any evidence of recurrent disease.  I realize that she certainly is at risk for recurrence.  I am just so happy that her overall quality of life is doing well.  I know that she will enjoy the holiday season.  Hopefully she will be able to see her grandson.    We will go ahead and plan to get her back in another 6 months.   Volanda Napoleon, MD 11/22/20229:10 AM

## 2021-09-26 DIAGNOSIS — N760 Acute vaginitis: Secondary | ICD-10-CM | POA: Diagnosis not present

## 2021-09-26 DIAGNOSIS — N8111 Cystocele, midline: Secondary | ICD-10-CM | POA: Diagnosis not present

## 2021-11-08 ENCOUNTER — Other Ambulatory Visit: Payer: Self-pay | Admitting: Hematology & Oncology

## 2021-11-09 DIAGNOSIS — Z1231 Encounter for screening mammogram for malignant neoplasm of breast: Secondary | ICD-10-CM | POA: Diagnosis not present

## 2021-11-13 DIAGNOSIS — R922 Inconclusive mammogram: Secondary | ICD-10-CM | POA: Diagnosis not present

## 2021-12-11 DIAGNOSIS — K080 Exfoliation of teeth due to systemic causes: Secondary | ICD-10-CM | POA: Diagnosis not present

## 2021-12-12 ENCOUNTER — Telehealth: Payer: Self-pay | Admitting: Orthopedic Surgery

## 2021-12-12 MED ORDER — AMOXICILLIN 500 MG PO TABS: ORAL_TABLET | ORAL | 0 refills | Status: DC

## 2021-12-12 NOTE — Telephone Encounter (Signed)
Pt called requesting a call back concerning a refill of amoxicillin. Pt states she will be starting dental work soon. Pt phone number is [phone].

## 2021-12-12 NOTE — Telephone Encounter (Signed)
IC advised submitted.  

## 2022-01-10 DIAGNOSIS — R8290 Unspecified abnormal findings in urine: Secondary | ICD-10-CM | POA: Diagnosis not present

## 2022-01-10 DIAGNOSIS — N993 Prolapse of vaginal vault after hysterectomy: Secondary | ICD-10-CM | POA: Diagnosis not present

## 2022-01-10 DIAGNOSIS — N3946 Mixed incontinence: Secondary | ICD-10-CM | POA: Diagnosis not present

## 2022-01-10 DIAGNOSIS — N8110 Cystocele, unspecified: Secondary | ICD-10-CM | POA: Diagnosis not present

## 2022-01-10 DIAGNOSIS — N393 Stress incontinence (female) (male): Secondary | ICD-10-CM | POA: Diagnosis not present

## 2022-03-08 ENCOUNTER — Other Ambulatory Visit: Payer: Self-pay | Admitting: Hematology & Oncology

## 2022-03-08 DIAGNOSIS — E559 Vitamin D deficiency, unspecified: Secondary | ICD-10-CM

## 2022-03-19 ENCOUNTER — Inpatient Hospital Stay: Payer: Federal, State, Local not specified - PPO | Attending: Hematology & Oncology

## 2022-03-19 ENCOUNTER — Inpatient Hospital Stay: Payer: Federal, State, Local not specified - PPO | Admitting: Hematology & Oncology

## 2022-03-19 ENCOUNTER — Encounter: Payer: Self-pay | Admitting: Hematology & Oncology

## 2022-03-19 VITALS — BP 115/82 | HR 89 | Temp 98.0°F | Resp 18 | Ht 66.0 in | Wt 162.0 lb

## 2022-03-19 DIAGNOSIS — Z79899 Other long term (current) drug therapy: Secondary | ICD-10-CM | POA: Diagnosis not present

## 2022-03-19 DIAGNOSIS — C50011 Malignant neoplasm of nipple and areola, right female breast: Secondary | ICD-10-CM | POA: Diagnosis not present

## 2022-03-19 DIAGNOSIS — Z171 Estrogen receptor negative status [ER-]: Secondary | ICD-10-CM | POA: Diagnosis not present

## 2022-03-19 DIAGNOSIS — Z7982 Long term (current) use of aspirin: Secondary | ICD-10-CM | POA: Insufficient documentation

## 2022-03-19 DIAGNOSIS — Z86718 Personal history of other venous thrombosis and embolism: Secondary | ICD-10-CM | POA: Diagnosis not present

## 2022-03-19 DIAGNOSIS — Z853 Personal history of malignant neoplasm of breast: Secondary | ICD-10-CM | POA: Diagnosis not present

## 2022-03-19 LAB — CMP (CANCER CENTER ONLY)
ALT: 13 U/L (ref 0–44)
AST: 20 U/L (ref 15–41)
Albumin: 4.3 g/dL (ref 3.5–5.0)
Alkaline Phosphatase: 62 U/L (ref 38–126)
Anion gap: 8 (ref 5–15)
BUN: 16 mg/dL (ref 8–23)
CO2: 30 mmol/L (ref 22–32)
Calcium: 10.9 mg/dL — ABNORMAL HIGH (ref 8.9–10.3)
Chloride: 101 mmol/L (ref 98–111)
Creatinine: 0.77 mg/dL (ref 0.44–1.00)
GFR, Estimated: >60 mL/min (ref >60)
Glucose, Bld: 105 mg/dL — ABNORMAL HIGH (ref 70–99)
Potassium: 4 mmol/L (ref 3.5–5.1)
Sodium: 139 mmol/L (ref 135–145)
Total Bilirubin: 0.4 mg/dL (ref 0.3–1.2)
Total Protein: 7.6 g/dL (ref 6.5–8.1)

## 2022-03-19 LAB — CBC WITH DIFFERENTIAL (CANCER CENTER ONLY)
Abs Immature Granulocytes: 0.02 10*3/uL (ref 0.00–0.07)
Basophils Absolute: 0.1 10*3/uL (ref 0.0–0.1)
Basophils Relative: 1 %
Eosinophils Absolute: 0.2 10*3/uL (ref 0.0–0.5)
Eosinophils Relative: 3 %
HCT: 38.5 % (ref 36.0–46.0)
Hemoglobin: 12.5 g/dL (ref 12.0–15.0)
Immature Granulocytes: 0 %
Lymphocytes Relative: 27 %
Lymphs Abs: 1.9 10*3/uL (ref 0.7–4.0)
MCH: 31.3 pg (ref 26.0–34.0)
MCHC: 32.5 g/dL (ref 30.0–36.0)
MCV: 96.5 fL (ref 80.0–100.0)
Monocytes Absolute: 0.6 10*3/uL (ref 0.1–1.0)
Monocytes Relative: 9 %
Neutro Abs: 4.1 10*3/uL (ref 1.7–7.7)
Neutrophils Relative %: 60 %
Platelet Count: 316 10*3/uL (ref 150–400)
RBC: 3.99 MIL/uL (ref 3.87–5.11)
RDW: 12.2 % (ref 11.5–15.5)
WBC Count: 7 10*3/uL (ref 4.0–10.5)
nRBC: 0 % (ref 0.0–0.2)

## 2022-03-19 LAB — LACTATE DEHYDROGENASE: LDH: 159 U/L (ref 98–192)

## 2022-03-19 NOTE — Progress Notes (Signed)
Hematology and Oncology Follow Up Visit  Maureen Duncan [id number] 11/30/1950 71 y.o. 03/19/2022   Principle Diagnosis:  Stage IIb (T2N1M0) ductal carcinoma of the right breast-ER negative/HER-2 positive History of DVT of the left subclavian vein  Current Therapy:   Aspirin 162 mg by mouth daily     Interim History:  Ms.  Tuma is back for followup.  We last saw her before Thanksgiving.  She had no problems over the Winter.  She was sad that there was really no Winter.  The big news is that she is going to pick up her grandson this weekend.  He lives down in [place] with his mom.  Hopefully, he will be up here with her all summer.  She really likes to have him around.  She is doing well with her health.  She really has had no problems since we last saw her.  She has had no change in bowel or bladder habits.  She has had no cough.  She has had no issues with COVID.  She has had no nausea or vomiting.  She has had no rashes.  She has had little bit of lymphedema on the right arm.  This is about the same.  She has had no fever.  There is been no bleeding.  Overall, I would say performance status is probably ECOG 1.   Medications:  Current Outpatient Medications:    acetaminophen (TYLENOL) 500 MG tablet, Take 2 tablets (1,000 mg total) by mouth every 6 (six) hours., Disp: 30 tablet, Rfl: 0   amLODipine-benazepril (LOTREL) 5-20 MG capsule, TAKE 1 CAPSULE BY MOUTH EVERY DAY, Disp: 90 capsule, Rfl: 1   atorvastatin (LIPITOR) 40 MG tablet, Take 40 mg by mouth daily., Disp: , Rfl:    fluticasone (FLONASE) 50 MCG/ACT nasal spray, SPRAY 2 SPRAYS INTO EACH NOSTRIL EVERY DAY AS NEEDED, Disp: 16 mL, Rfl: 3   MELATONIN PO, Take by mouth as needed., Disp: , Rfl:    metroNIDAZOLE (METROGEL) 0.75 % vaginal gel, metronidazole 0.75 % vaginal gel  INSERT 1 APPLICATORFUL VAGINALLY EVERY DAY FOR 5 DAYS, Disp: , Rfl:    triamcinolone cream (KENALOG) 0.1 %, Apply 1 application topically daily as needed  (rash). , Disp: , Rfl:    Vitamin D, Ergocalciferol, (DRISDOL) 1.25 MG (50000 UNIT) CAPS capsule, TAKE 1 CAPSULE BY MOUTH 2 TIMES A WEEK, Disp: 24 capsule, Rfl: 3   amoxicillin (AMOXIL) 500 MG tablet, Take 2g 1 hour prior to dental procedure (Patient not taking: Reported on 03/19/2022), Disp: 10 tablet, Rfl: 0   diphenhydrAMINE (BENADRYL) 25 MG tablet, Take 50 mg by mouth daily as needed for itching or allergies.  (Patient not taking: Reported on 03/19/2022), Disp: , Rfl:    diphenhydramine-acetaminophen (TYLENOL PM) 25-500 MG TABS, Take 1 tablet by mouth at bedtime as needed (sleep).  (Patient not taking: Reported on 03/19/2022), Disp: , Rfl:    docusate sodium (COLACE) 100 MG capsule, Take 1 capsule (100 mg total) by mouth 2 (two) times daily. (Patient not taking: Reported on 03/19/2022), Disp: 10 capsule, Rfl: 0   magnesium citrate SOLN, Take 1 Bottle by mouth every 30 (thirty) days. (Patient not taking: Reported on 03/19/2022), Disp: , Rfl:  No current facility-administered medications for this visit.  Facility-Administered Medications Ordered in Other Visits:    fentaNYL (SUBLIMAZE) injection, , , Anesthesia Intra-op, Jenne Campus, CRNA, 50 mcg at 02/24/18 5364   lactated ringers infusion, , , Continuous PRN, Rock, Oletta Darter, CRNA, [place] Bag at  04/07/18 0822   midazolam (VERSED) 5 MG/5ML injection, , , Anesthesia Intra-op, Jenne Campus, CRNA, 2 mg at 02/24/18 0703   sodium chloride 0.9 % injection 10 mL, 10 mL, Intravenous, PRN, Volanda Napoleon, MD, 10 mL at 06/17/12 1651  Allergies:  Allergies  Allergen Reactions   Meloxicam     Pt states "gives me a weird feeling"  "makes my heart race and makes me anxious"  Other reaction(s): Tachycardia   Adhesive [Tape] Rash    Past Medical History, Surgical history, Social history, and Family History were reviewed and updated.  Review of Systems: Review of Systems  Constitutional: Negative.   HENT: Negative.    Eyes: Negative.    Respiratory: Negative.    Cardiovascular: Negative.   Gastrointestinal: Negative.   Genitourinary: Negative.   Musculoskeletal: Negative.   Skin: Negative.   Neurological: Negative.   Endo/Heme/Allergies: Negative.   Psychiatric/Behavioral: Negative.     Physical Exam:  vitals were not taken for this visit.   Physical Exam Vitals reviewed.  Constitutional:      Comments: Breast exam shows left breast with no masses, edema or erythema.  There is no left nipple discharge.  There is no left axillary adenopathy.  Right chest wall shows well-healed mastectomy.  There is no right chest wall nodules.  There is no right chest wall erythema.  There is no right axillary adenopathy.  HENT:     Head: Normocephalic and atraumatic.  Eyes:     Pupils: Pupils are equal, round, and reactive to light.  Cardiovascular:     Rate and Rhythm: Normal rate and regular rhythm.     Heart sounds: Normal heart sounds.  Pulmonary:     Effort: Pulmonary effort is normal.     Breath sounds: Normal breath sounds.  Abdominal:     General: Bowel sounds are normal.     Palpations: Abdomen is soft.  Musculoskeletal:        General: No tenderness or deformity. Normal range of motion.     Cervical back: Normal range of motion.  Lymphadenopathy:     Cervical: No cervical adenopathy.  Skin:    General: Skin is warm and dry.     Findings: No erythema or rash.  Neurological:     Mental Status: She is alert and oriented to person, place, and time.  Psychiatric:        Behavior: Behavior normal.        Thought Content: Thought content normal.        Judgment: Judgment normal.     Lab Results  Component Value Date   WBC 5.1 09/18/2021   HGB 12.5 09/18/2021   HCT 38.5 09/18/2021   MCV 95.8 09/18/2021   PLT 253 09/18/2021     Chemistry      Component Value Date/Time   NA 138 09/18/2021 0818   NA 141 07/28/2017 0911   NA 142 01/27/2017 0849   K 3.9 09/18/2021 0818   K 3.8 07/28/2017 0911   K 4.3  01/27/2017 0849   CL 102 09/18/2021 0818   CL 105 07/28/2017 0911   CO2 29 09/18/2021 0818   CO2 29 07/28/2017 0911   CO2 29 01/27/2017 0849   BUN 14 09/18/2021 0818   BUN 13 07/28/2017 0911   BUN 16.9 01/27/2017 0849   CREATININE 0.74 09/18/2021 0818   CREATININE 0.8 07/28/2017 0911   CREATININE 0.8 01/27/2017 0849      Component Value Date/Time   CALCIUM 11.4 (  H) 09/18/2021 0818   CALCIUM 10.0 07/28/2017 0911   CALCIUM 10.5 (H) 01/27/2017 0849   ALKPHOS 54 09/18/2021 0818   ALKPHOS 64 07/28/2017 0911   ALKPHOS 67 01/27/2017 0849   AST 17 09/18/2021 0818   AST 20 01/27/2017 0849   ALT 13 09/18/2021 0818   ALT 19 07/28/2017 0911   ALT 16 01/27/2017 0849   BILITOT 0.5 09/18/2021 0818   BILITOT 0.53 01/27/2017 0849      Impression and Plan: Ms. Thrush is 71 year old African female. She has a history of stage IIb ductal carcinoma of the right breast. Her tumor was HER-2 positive. She was treated with systemic chemotherapy followed by a year of Herceptin. She completed this in August of 2014.  Of note, she had 3 positive lymph nodes. She did receive 6 cycles of chemotherapy with Taxotere and carboplatinum.  I do not see any evidence of recurrent disease.  I realize that she certainly is at risk for recurrence.  I really hope that she has a wonderful summer with her grandson.  I know that they will have a wonderful time together.  This will be his senior year in high school.  She is hoping that he will come to college up in [place] at [employer].   We will go ahead and plan to get her back in another 6 months.   Volanda Napoleon, MD 5/23/20238:42 AM

## 2022-05-02 ENCOUNTER — Emergency Department (HOSPITAL_COMMUNITY)
Admission: EM | Admit: 2022-05-02 | Discharge: 2022-05-03 | Disposition: A | Discharge status: Home / Self Care | Payer: Federal, State, Local not specified - PPO | Attending: Emergency Medicine | Admitting: Emergency Medicine

## 2022-05-02 ENCOUNTER — Emergency Department (HOSPITAL_COMMUNITY): Payer: Federal, State, Local not specified - PPO

## 2022-05-02 DIAGNOSIS — S2242XA Multiple fractures of ribs, left side, initial encounter for closed fracture: Secondary | ICD-10-CM | POA: Insufficient documentation

## 2022-05-02 DIAGNOSIS — Y92410 Unspecified street and highway as the place of occurrence of the external cause: Secondary | ICD-10-CM | POA: Diagnosis not present

## 2022-05-02 DIAGNOSIS — S60311A Abrasion of right thumb, initial encounter: Secondary | ICD-10-CM | POA: Insufficient documentation

## 2022-05-02 DIAGNOSIS — R0789 Other chest pain: Secondary | ICD-10-CM | POA: Diagnosis not present
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
## 2022-05-03 NOTE — ED Notes (Signed)
Pt verbalized understanding of d/c instructions, meds, and followup care. Denies questions. VSS, no distress noted. Steady gait to exit with all belongings.  ?

## 2022-05-03 NOTE — Discharge Instructions (Signed)
It was a pleasure taking care of you today!  Your imaging in the ED showed left rib fractures.  You will be sent home with an incentive spirometer, use as directed. You will feel more sore in the morning. You may take over the counter 600 mg Ibuprofen every 6 hours or 1,000 mg Tylenol every 6 hours as needed for pain for no more than 7 days. You may apply ice or heat to affected area for up to 15 minutes at a time. Ensure to place a barrier between your skin and the ice/heat. Return to the Emergency Department if you are experiencing increasing/worsening chest pain, trouble breathing, or worsening symptoms.
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
## 2022-05-06 ENCOUNTER — Other Ambulatory Visit: Payer: Self-pay | Admitting: Hematology & Oncology

## 2022-05-14 DIAGNOSIS — S20219A Contusion of unspecified front wall of thorax, initial encounter: Secondary | ICD-10-CM | POA: Diagnosis not present

## 2022-05-14 DIAGNOSIS — S2242XA Multiple fractures of ribs, left side, initial encounter for closed fracture: Secondary | ICD-10-CM | POA: Diagnosis not present

## 2022-05-28 DIAGNOSIS — S20219A Contusion of unspecified front wall of thorax, initial encounter: Secondary | ICD-10-CM | POA: Diagnosis not present

## 2022-05-28 DIAGNOSIS — S2242XA Multiple fractures of ribs, left side, initial encounter for closed fracture: Secondary | ICD-10-CM | POA: Diagnosis not present

## 2022-06-03 ENCOUNTER — Other Ambulatory Visit: Payer: Self-pay | Admitting: Orthopedic Surgery

## 2022-06-04 DIAGNOSIS — S2242XD Multiple fractures of ribs, left side, subsequent encounter for fracture with routine healing: Secondary | ICD-10-CM | POA: Diagnosis not present

## 2022-06-04 DIAGNOSIS — M25512 Pain in left shoulder: Secondary | ICD-10-CM | POA: Diagnosis not present

## 2022-06-04 DIAGNOSIS — R0602 Shortness of breath: Secondary | ICD-10-CM | POA: Diagnosis not present

## 2022-06-06 ENCOUNTER — Other Ambulatory Visit: Payer: Self-pay

## 2022-06-06 ENCOUNTER — Ambulatory Visit
Admission: RE | Admit: 2022-06-06 | Discharge: 2022-06-06 | Disposition: A | Discharge status: Home / Self Care | Payer: Federal, State, Local not specified - PPO | Source: Ambulatory Visit

## 2022-06-06 DIAGNOSIS — R0602 Shortness of breath: Secondary | ICD-10-CM

## 2022-06-06 DIAGNOSIS — I3139 Other pericardial effusion (noninflammatory): Secondary | ICD-10-CM | POA: Diagnosis not present

## 2022-06-06 DIAGNOSIS — S2242XD Multiple fractures of ribs, left side, subsequent encounter for fracture with routine healing: Secondary | ICD-10-CM

## 2022-06-12 ENCOUNTER — Other Ambulatory Visit (HOSPITAL_COMMUNITY): Payer: Self-pay | Admitting: Internal Medicine

## 2022-06-12 ENCOUNTER — Other Ambulatory Visit: Payer: Self-pay | Admitting: Internal Medicine

## 2022-06-12 DIAGNOSIS — S2242XD Multiple fractures of ribs, left side, subsequent encounter for fracture with routine healing: Secondary | ICD-10-CM

## 2022-06-12 DIAGNOSIS — Z853 Personal history of malignant neoplasm of breast: Secondary | ICD-10-CM

## 2022-06-21 ENCOUNTER — Encounter (HOSPITAL_COMMUNITY)
Admission: RE | Admit: 2022-06-21 | Discharge: 2022-06-21 | Disposition: A | Discharge status: Home / Self Care | Payer: Federal, State, Local not specified - PPO | Source: Ambulatory Visit | Attending: Internal Medicine | Admitting: Internal Medicine

## 2022-06-21 DIAGNOSIS — Z853 Personal history of malignant neoplasm of breast: Secondary | ICD-10-CM | POA: Diagnosis not present

## 2022-06-21 DIAGNOSIS — S2242XD Multiple fractures of ribs, left side, subsequent encounter for fracture with routine healing: Secondary | ICD-10-CM | POA: Diagnosis not present

## 2022-06-21 MED ORDER — TECHNETIUM TC 99M MEDRONATE IV KIT
20.0000 | PACK | Freq: Once | INTRAVENOUS | Status: AC | PRN
  Administered 2022-06-21: 20 via INTRAVENOUS

## 2022-06-24 DIAGNOSIS — M19011 Primary osteoarthritis, right shoulder: Secondary | ICD-10-CM | POA: Diagnosis not present

## 2022-06-24 DIAGNOSIS — S2220XA Unspecified fracture of sternum, initial encounter for closed fracture: Secondary | ICD-10-CM | POA: Diagnosis not present

## 2022-06-24 DIAGNOSIS — Z853 Personal history of malignant neoplasm of breast: Secondary | ICD-10-CM | POA: Diagnosis not present

## 2022-06-24 DIAGNOSIS — S2242XA Multiple fractures of ribs, left side, initial encounter for closed fracture: Secondary | ICD-10-CM | POA: Diagnosis not present

## 2022-07-04 DIAGNOSIS — R6884 Jaw pain: Secondary | ICD-10-CM | POA: Diagnosis not present

## 2022-07-23 DIAGNOSIS — M940 Chondrocostal junction syndrome [Tietze]: Secondary | ICD-10-CM | POA: Diagnosis not present

## 2022-07-23 DIAGNOSIS — S2242XA Multiple fractures of ribs, left side, initial encounter for closed fracture: Secondary | ICD-10-CM | POA: Diagnosis not present

## 2022-07-23 DIAGNOSIS — S20219A Contusion of unspecified front wall of thorax, initial encounter: Secondary | ICD-10-CM | POA: Diagnosis not present

## 2022-08-06 ENCOUNTER — Other Ambulatory Visit: Payer: Self-pay | Admitting: Hematology & Oncology

## 2022-08-06 ENCOUNTER — Ambulatory Visit (INDEPENDENT_AMBULATORY_CARE_PROVIDER_SITE_OTHER): Payer: Federal, State, Local not specified - PPO | Admitting: Orthopedic Surgery

## 2022-08-06 ENCOUNTER — Encounter: Payer: Self-pay | Admitting: Orthopedic Surgery

## 2022-08-06 ENCOUNTER — Ambulatory Visit (INDEPENDENT_AMBULATORY_CARE_PROVIDER_SITE_OTHER): Payer: Federal, State, Local not specified - PPO

## 2022-08-06 DIAGNOSIS — G8929 Other chronic pain: Secondary | ICD-10-CM

## 2022-08-06 DIAGNOSIS — M25561 Pain in right knee: Secondary | ICD-10-CM

## 2022-08-06 DIAGNOSIS — M1711 Unilateral primary osteoarthritis, right knee: Secondary | ICD-10-CM

## 2022-08-06 MED ORDER — LIDOCAINE HCL (PF) 1 % IJ SOLN
5.0000 mL | INTRAMUSCULAR | Status: AC | PRN
  Administered 2022-08-06: 5 mL

## 2022-08-06 MED ORDER — METHYLPREDNISOLONE ACETATE 40 MG/ML IJ SUSP
40.0000 mg | INTRAMUSCULAR | Status: AC | PRN
  Administered 2022-08-06: 40 mg via INTRA_ARTICULAR

## 2022-08-06 NOTE — Progress Notes (Signed)
Office Visit Note   Patient: Maureen Duncan           Date of Birth: 08/12/1951           MRN: [id number] Visit Date: 08/06/2022              Requested by: Deland Pretty, MD [place],  [place] [id number] PCP: Deland Pretty, MD  Chief Complaint  Patient presents with   Right Knee - Pain      HPI: Patient is a 71 year old woman who is seen for right knee pain she complains of swelling locking giving way she states she has not fallen.  She states she has pain and swelling in the popliteal fossa as well.  She has tried Voltaren gel and Biofreeze.  Patient is status post a left total knee arthroplasty with Dr. Marlou Sa in June 2019.  Assessment & Plan: Visit Diagnoses:  1. Chronic pain of right knee   2. Unilateral primary osteoarthritis, right knee     Plan: Patient states she is not ready to proceed with a total knee arthroplasty at this time she underwent an intra-articular injection will reevaluate in 4 weeks.  Follow-Up Instructions: Return in about 4 weeks (around 09/03/2022).   Ortho Exam  Patient is alert, oriented, no adenopathy, well-dressed, normal affect, normal respiratory effort. Examination patient has crepitation with range of motion of the right knee she is tender to palpation the patellofemoral joint as well as medial lateral joint lines she does have a mild effusion.  Collaterals and cruciates are stable.  Imaging: XR Knee 1-2 Views Right  Result Date: 08/06/2022 2 view radiographs of the right knee shows lateral joint space narrowing with slight valgus.  There are periarticular bony spurs in all 3 compartments with subchondral or sclerosis and cysts.  In comparison patient is a stable press-fit total knee arthroplasty on the left.  No images are attached to the encounter.  Labs: Lab Results  Component Value Date   REPTSTATUS 03/28/2018 FINAL 03/27/2018   CULT (A) 03/27/2018    <10,000 COLONIES/mL INSIGNIFICANT GROWTH Performed  at [facility] [address], [place], [place] [address]    LABORGA NO GROWTH 04/27/2014     Lab Results  Component Value Date   ALBUMIN 4.3 03/19/2022   ALBUMIN 4.1 09/18/2021   ALBUMIN 4.2 02/23/2021    No results found for: "MG" Lab Results  Component Value Date   VD25OH 29.5 (L) 01/06/2019   VD25OH 19.2 (L) 01/05/2018   VD25OH 37.2 01/27/2017    No results found for: "PREALBUMIN"    Latest Ref Rng & Units 03/19/2022    8:25 AM 09/18/2021    8:18 AM 02/23/2021   11:58 AM  CBC EXTENDED  WBC 4.0 - 10.5 K/uL 7.0  5.1  5.4   RBC 3.87 - 5.11 MIL/uL 3.99  4.02  4.02   Hemoglobin 12.0 - 15.0 g/dL 12.5  12.5  12.5   HCT 36.0 - 46.0 % 38.5  38.5  38.2   Platelets 150 - 400 K/uL 316  253  251   NEUT# 1.7 - 7.7 K/uL 4.1  3.2  3.2   Lymph# 0.7 - 4.0 K/uL 1.9  1.3  1.5      There is no height or weight on file to calculate BMI.  Orders:  Orders Placed This Encounter  Procedures   XR Knee 1-2 Views Right   No orders of the defined types were placed in this  encounter.    Procedures: Large Joint Inj: R knee on 08/06/2022 5:05 PM Indications: pain and diagnostic evaluation Details: 22 G 1.5 in needle, anteromedial approach  Arthrogram: No  Medications: 5 mL lidocaine (PF) 1 %; 40 mg methylPREDNISolone acetate 40 MG/ML Outcome: tolerated well, no immediate complications Procedure, treatment alternatives, risks and benefits explained, specific risks discussed. Consent was given by the patient. Immediately prior to procedure a time out was called to verify the correct patient, procedure, equipment, support staff and site/side marked as required. Patient was prepped and draped in the usual sterile fashion.      Clinical Data: No additional findings.  ROS:  All other systems negative, except as noted in the HPI. Review of Systems  Objective: Vital Signs: There were no vitals taken for this visit.  Specialty Comments:  No specialty comments  available.  PMFS History: Patient Active Problem List   Diagnosis Date Noted   Rib fractures 11/03/2019   Multiple trauma 11/02/2019   Arthritis of knee 04/07/2018   Unilateral primary osteoarthritis, left knee    Prolapse of female bladder, acquired 05/08/2015   Right elbow pain 07/25/2014   Hyperlipidemia 04/28/2014   Allergic rhinitis 04/28/2014   Breast cancer, right. T2, N1. Mastectomy 01/30/2012. 01/09/2012   Past Medical History:  Diagnosis Date   Arthritis    Breast cancer ([place]) right breast dx 12/ 2000;  recurrence right breast 01/30/12   oncologist-  dr Marin Olp--  Stage IIb, grade 3, (T2n1M0) ductal carcinoma right breast-ER negative/ HER-2 positive---  s/p  right modified mastectomy with node dissection (3 positive) and chemotherapy (ended aug 2013) and Herceptin ended Aug 2014   Cystocele    GERD (gastroesophageal reflux disease)    occasionally, takes Zantac OTC when needed   History of DVT (deep vein thrombosis)    left subclavian vein -- port of PAC--  removed 06-23-2012   Hypertension    LBBB (left bundle branch block)    Lymphedema of upper extremity    right arm   Postmenopausal osteoporosis    Rib fractures 11/01/2019   fall at home   Uterine prolapse    Wears partial dentures    upper    Family History  Problem Relation Age of Onset   Diabetes Mother    Hypertension Mother    Kidney failure Mother    Kidney disease Mother    Cancer Mother        pancreatic   Other Brother        heart transplant   Cancer Maternal Uncle        lung    Past Surgical History:  Procedure Laterality Date   ABDOMINAL HYSTERECTOMY     BREAST LUMPECTOMY Right Jan 2001   COLONOSCOPY  last one -- June 2016   WNL   CYSTOCELE REPAIR N/A 05/08/2015   Procedure: ANTERIOR REPAIR (CYSTOCELE);  Surgeon: Molli Posey, MD;  Location: [facility];  Service: Gynecology;  Laterality: N/A;   LAPAROSCOPIC ASSISTED VAGINAL HYSTERECTOMY N/A 05/08/2015   Procedure:  LAPAROSCOPIC ASSISTED VAGINAL HYSTERECTOMY;  Surgeon: Molli Posey, MD;  Location: [facility];  Service: Gynecology;  Laterality: N/A;   LAPAROSCOPIC BILATERAL SALPINGO OOPHERECTOMY Bilateral 05/08/2015   Procedure: LAPAROSCOPIC BILATERAL SALPINGO OOPHORECTOMY;  Surgeon: Molli Posey, MD;  Location: [facility];  Service: Gynecology;  Laterality: Bilateral;   MODIFIED RADICAL MASTECTOMY W/ AXILLARY LYMPH NODE DISSECTION Right 01/30/12   PORT-A-CATH REMOVAL  06/23/2012   Procedure: MINOR REMOVAL PORT-A-CATH;  Surgeon:  Shann Medal, MD;  Location: [facility];  Service: General;  Laterality: Left;   PORTACATH PLACEMENT  01/30/2012   Procedure: INSERTION PORT-A-CATH;  Surgeon: Shann Medal, MD;  Location: [facility];  Service: General;  Laterality: Left;   TOTAL KNEE ARTHROPLASTY Left 04/07/2018   TOTAL KNEE ARTHROPLASTY Left 04/07/2018   Procedure: LEFT TOTAL KNEE ARTHROPLASTY;  Surgeon: Meredith Pel, MD;  Location: [facility];  Service: Orthopedics;  Laterality: Left;   TRANSTHORACIC ECHOCARDIOGRAM  08-24-2012   grade I diastolic dysfunction,  ef 45-50%/  trivial MR   TUBAL LIGATION  1980   Social History   Occupational History   Not on file  Tobacco Use   Smoking status: Former    Packs/day: 0.25    Years: 2.00    Total pack years: 0.50    Types: Cigarettes    Start date: 03/31/2010    Quit date: 11/11/2011    Years since quitting: 10.7   Smokeless tobacco: Never   Tobacco comments:    social smoker  Vaping Use   Vaping Use: Never used  Substance and Sexual Activity   Alcohol use: Yes    Comment: occasional   Drug use: No   Sexual activity: Not on file

## 2022-08-12 DIAGNOSIS — W57XXXA Bitten or stung by nonvenomous insect and other nonvenomous arthropods, initial encounter: Secondary | ICD-10-CM | POA: Diagnosis not present

## 2022-08-12 DIAGNOSIS — Z23 Encounter for immunization: Secondary | ICD-10-CM | POA: Diagnosis not present

## 2022-09-03 ENCOUNTER — Ambulatory Visit: Payer: Federal, State, Local not specified - PPO | Admitting: Orthopedic Surgery

## 2022-09-23 ENCOUNTER — Other Ambulatory Visit: Payer: Self-pay

## 2022-09-23 ENCOUNTER — Inpatient Hospital Stay: Payer: Federal, State, Local not specified - PPO | Attending: Hematology & Oncology

## 2022-09-23 ENCOUNTER — Inpatient Hospital Stay (HOSPITAL_BASED_OUTPATIENT_CLINIC_OR_DEPARTMENT_OTHER): Payer: Federal, State, Local not specified - PPO | Admitting: Hematology & Oncology

## 2022-09-23 ENCOUNTER — Inpatient Hospital Stay: Payer: Federal, State, Local not specified - PPO

## 2022-09-23 ENCOUNTER — Ambulatory Visit: Payer: Federal, State, Local not specified - PPO | Admitting: Hematology & Oncology

## 2022-09-23 ENCOUNTER — Encounter: Payer: Self-pay | Admitting: Hematology & Oncology

## 2022-09-23 VITALS — BP 109/79 | HR 84 | Temp 97.9°F | Resp 16 | Ht 65.0 in | Wt 162.0 lb

## 2022-09-23 DIAGNOSIS — Z9221 Personal history of antineoplastic chemotherapy: Secondary | ICD-10-CM | POA: Diagnosis not present

## 2022-09-23 DIAGNOSIS — Z7982 Long term (current) use of aspirin: Secondary | ICD-10-CM | POA: Diagnosis not present

## 2022-09-23 DIAGNOSIS — Z79899 Other long term (current) drug therapy: Secondary | ICD-10-CM | POA: Insufficient documentation

## 2022-09-23 DIAGNOSIS — Z853 Personal history of malignant neoplasm of breast: Secondary | ICD-10-CM | POA: Insufficient documentation

## 2022-09-23 DIAGNOSIS — Z86718 Personal history of other venous thrombosis and embolism: Secondary | ICD-10-CM | POA: Diagnosis not present

## 2022-09-23 DIAGNOSIS — C50011 Malignant neoplasm of nipple and areola, right female breast: Secondary | ICD-10-CM | POA: Diagnosis not present

## 2022-09-23 LAB — CMP (CANCER CENTER ONLY)
ALT: 11 U/L (ref 0–44)
AST: 19 U/L (ref 15–41)
Albumin: 4.4 g/dL (ref 3.5–5.0)
Alkaline Phosphatase: 57 U/L (ref 38–126)
Anion gap: 8 (ref 5–15)
BUN: 17 mg/dL (ref 8–23)
CO2: 29 mmol/L (ref 22–32)
Calcium: 10.4 mg/dL — ABNORMAL HIGH (ref 8.9–10.3)
Chloride: 100 mmol/L (ref 98–111)
Creatinine: 0.78 mg/dL (ref 0.44–1.00)
GFR, Estimated: >60 mL/min
Glucose, Bld: 107 mg/dL — ABNORMAL HIGH (ref 70–99)
Potassium: 4.1 mmol/L (ref 3.5–5.1)
Sodium: 137 mmol/L (ref 135–145)
Total Bilirubin: 0.6 mg/dL (ref 0.3–1.2)
Total Protein: 7.1 g/dL (ref 6.5–8.1)

## 2022-09-23 LAB — CBC WITH DIFFERENTIAL (CANCER CENTER ONLY)
Abs Immature Granulocytes: 0.03 10*3/uL (ref 0.00–0.07)
Basophils Absolute: 0.1 10*3/uL (ref 0.0–0.1)
Basophils Relative: 1 %
Eosinophils Absolute: 0.1 10*3/uL (ref 0.0–0.5)
Eosinophils Relative: 1 %
HCT: 39.8 % (ref 36.0–46.0)
Hemoglobin: 12.8 g/dL (ref 12.0–15.0)
Immature Granulocytes: 0 %
Lymphocytes Relative: 24 %
Lymphs Abs: 1.7 10*3/uL (ref 0.7–4.0)
MCH: 31.1 pg (ref 26.0–34.0)
MCHC: 32.2 g/dL (ref 30.0–36.0)
MCV: 96.8 fL (ref 80.0–100.0)
Monocytes Absolute: 0.6 10*3/uL (ref 0.1–1.0)
Monocytes Relative: 8 %
Neutro Abs: 4.5 10*3/uL (ref 1.7–7.7)
Neutrophils Relative %: 66 %
Platelet Count: 271 10*3/uL (ref 150–400)
RBC: 4.11 MIL/uL (ref 3.87–5.11)
RDW: 12.7 % (ref 11.5–15.5)
WBC Count: 6.8 10*3/uL (ref 4.0–10.5)
nRBC: 0 % (ref 0.0–0.2)

## 2022-09-23 LAB — LACTATE DEHYDROGENASE: LDH: 145 U/L (ref 98–192)
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
## 2022-09-30 ENCOUNTER — Telehealth: Payer: Self-pay

## 2022-09-30 NOTE — Patient Outreach (Signed)
  Care Coordination   09/30/2022 Name: Maureen Duncan MRN: [id number] DOB: [birth year]   Care Coordination Outreach Attempts:  An unsuccessful telephone outreach was attempted today to offer the patient information about available care coordination services as a benefit of their health plan.   Follow Up Plan:  Additional outreach attempts will be made to offer the patient care coordination information and services.   Encounter Outcome:  No Answer   Care Coordination Interventions:  No, not indicated    Jone Baseman, RN, MSN [facility] Care Management Coordinator Direct Line [phone]

## 2022-10-16 ENCOUNTER — Telehealth: Payer: Self-pay

## 2022-10-16 NOTE — Patient Outreach (Signed)
  Care Coordination   10/16/2022 Name: Maureen Duncan MRN: [id number] DOB: 1951/06/22   Care Coordination Outreach Attempts:  A second unsuccessful outreach was attempted today to offer the patient with information about available care coordination services as a benefit of their health plan.     Follow Up Plan:  Additional outreach attempts will be made to offer the patient care coordination information and services.   Encounter Outcome:  No Answer   Care Coordination Interventions:  No, not indicated    Jone Baseman, RN, MSN [facility] Care Management Coordinator Direct Line [phone]

## 2022-10-30 ENCOUNTER — Telehealth: Payer: Self-pay

## 2022-10-30 NOTE — Patient Outreach (Signed)
  Care Coordination   10/30/2022 Name: APRILLE SAWHNEY MRN: [id number] DOB: 1950-12-16   Care Coordination Outreach Attempts:  A third unsuccessful outreach was attempted today to offer the patient with information about available care coordination services as a benefit of their health plan.   Follow Up Plan:  No further outreach attempts will be made at this time. We have been unable to contact the patient to offer or enroll patient in care coordination services  Encounter Outcome:  No Answer   Care Coordination Interventions:  No, not indicated   Jone Baseman, RN, MSN [facility] Care Management Coordinator Direct Line [phone]

## 2022-11-04 ENCOUNTER — Ambulatory Visit (INDEPENDENT_AMBULATORY_CARE_PROVIDER_SITE_OTHER): Payer: Federal, State, Local not specified - PPO | Admitting: Orthopedic Surgery

## 2022-11-04 DIAGNOSIS — M1711 Unilateral primary osteoarthritis, right knee: Secondary | ICD-10-CM

## 2022-11-08 ENCOUNTER — Encounter: Payer: Self-pay | Admitting: Orthopedic Surgery

## 2022-11-08 DIAGNOSIS — M1711 Unilateral primary osteoarthritis, right knee: Secondary | ICD-10-CM

## 2022-11-08 MED ORDER — METHYLPREDNISOLONE ACETATE 40 MG/ML IJ SUSP
40.0000 mg | INTRAMUSCULAR | Status: AC | PRN
  Administered 2022-11-08: 40 mg via INTRA_ARTICULAR

## 2022-11-08 MED ORDER — LIDOCAINE HCL (PF) 1 % IJ SOLN
5.0000 mL | INTRAMUSCULAR | Status: AC | PRN
  Administered 2022-11-08: 5 mL
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
## 2022-12-26 DIAGNOSIS — Z1231 Encounter for screening mammogram for malignant neoplasm of breast: Secondary | ICD-10-CM | POA: Diagnosis not present

## 2023-01-29 ENCOUNTER — Other Ambulatory Visit: Payer: Self-pay | Admitting: Hematology & Oncology

## 2023-01-29 DIAGNOSIS — E559 Vitamin D deficiency, unspecified: Secondary | ICD-10-CM

## 2023-03-06 DIAGNOSIS — L821 Other seborrheic keratosis: Secondary | ICD-10-CM | POA: Diagnosis not present

## 2023-03-06 DIAGNOSIS — L7211 Pilar cyst: Secondary | ICD-10-CM | POA: Diagnosis not present

## 2023-03-21 ENCOUNTER — Ambulatory Visit: Payer: Federal, State, Local not specified - PPO | Admitting: Hematology & Oncology

## 2023-03-21 ENCOUNTER — Other Ambulatory Visit: Payer: Federal, State, Local not specified - PPO

## 2023-03-24 DIAGNOSIS — C50911 Malignant neoplasm of unspecified site of right female breast: Secondary | ICD-10-CM | POA: Diagnosis not present

## 2023-03-28 ENCOUNTER — Inpatient Hospital Stay (HOSPITAL_BASED_OUTPATIENT_CLINIC_OR_DEPARTMENT_OTHER): Payer: Federal, State, Local not specified - PPO | Admitting: Hematology & Oncology

## 2023-03-28 ENCOUNTER — Encounter: Payer: Self-pay | Admitting: Hematology & Oncology

## 2023-03-28 ENCOUNTER — Inpatient Hospital Stay: Payer: Federal, State, Local not specified - PPO | Attending: Hematology & Oncology

## 2023-03-28 VITALS — HR 72 | Temp 98.0°F | Resp 20 | Ht 65.0 in | Wt 158.1 lb

## 2023-03-28 DIAGNOSIS — Z853 Personal history of malignant neoplasm of breast: Secondary | ICD-10-CM | POA: Diagnosis not present

## 2023-03-28 DIAGNOSIS — Z9221 Personal history of antineoplastic chemotherapy: Secondary | ICD-10-CM | POA: Insufficient documentation

## 2023-03-28 DIAGNOSIS — C50011 Malignant neoplasm of nipple and areola, right female breast: Secondary | ICD-10-CM

## 2023-03-28 LAB — CMP (CANCER CENTER ONLY)
ALT: 15 U/L (ref 0–44)
AST: 19 U/L (ref 15–41)
Albumin: 4.4 g/dL (ref 3.5–5.0)
Alkaline Phosphatase: 51 U/L (ref 38–126)
Anion gap: 7 (ref 5–15)
BUN: 9 mg/dL (ref 8–23)
CO2: 31 mmol/L (ref 22–32)
Calcium: 10.6 mg/dL — ABNORMAL HIGH (ref 8.9–10.3)
Chloride: 104 mmol/L (ref 98–111)
Creatinine: 0.73 mg/dL (ref 0.44–1.00)
GFR, Estimated: >60 mL/min (ref >60)
Glucose, Bld: 100 mg/dL — ABNORMAL HIGH (ref 70–99)
Potassium: 4.4 mmol/L (ref 3.5–5.1)
Sodium: 142 mmol/L (ref 135–145)
Total Bilirubin: 0.7 mg/dL (ref 0.3–1.2)
Total Protein: 7.3 g/dL (ref 6.5–8.1)

## 2023-03-28 LAB — CBC WITH DIFFERENTIAL (CANCER CENTER ONLY)
Abs Immature Granulocytes: 0.02 10*3/uL (ref 0.00–0.07)
Basophils Absolute: 0 10*3/uL (ref 0.0–0.1)
Basophils Relative: 1 %
Eosinophils Absolute: 0.1 10*3/uL (ref 0.0–0.5)
Eosinophils Relative: 1 %
HCT: 39.3 % (ref 36.0–46.0)
Hemoglobin: 12.5 g/dL (ref 12.0–15.0)
Immature Granulocytes: 0 %
Lymphocytes Relative: 34 %
Lymphs Abs: 1.9 10*3/uL (ref 0.7–4.0)
MCH: 31 pg (ref 26.0–34.0)
MCHC: 31.8 g/dL (ref 30.0–36.0)
MCV: 97.5 fL (ref 80.0–100.0)
Monocytes Absolute: 0.5 10*3/uL (ref 0.1–1.0)
Monocytes Relative: 9 %
Neutro Abs: 3.1 10*3/uL (ref 1.7–7.7)
Neutrophils Relative %: 55 %
Platelet Count: 239 10*3/uL (ref 150–400)
RBC: 4.03 MIL/uL (ref 3.87–5.11)
RDW: 12.8 % (ref 11.5–15.5)
WBC Count: 5.7 10*3/uL (ref 4.0–10.5)
nRBC: 0 % (ref 0.0–0.2)

## 2023-03-28 LAB — LACTATE DEHYDROGENASE: LDH: 152 U/L (ref 98–192)
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
## 2023-04-17 DIAGNOSIS — C50911 Malignant neoplasm of unspecified site of right female breast: Secondary | ICD-10-CM | POA: Diagnosis not present

## 2023-06-25 DIAGNOSIS — Z13820 Encounter for screening for osteoporosis: Secondary | ICD-10-CM | POA: Diagnosis not present

## 2023-07-03 DIAGNOSIS — Z01419 Encounter for gynecological examination (general) (routine) without abnormal findings: Secondary | ICD-10-CM | POA: Diagnosis not present

## 2023-07-03 DIAGNOSIS — Z6825 Body mass index (BMI) 25.0-25.9, adult: Secondary | ICD-10-CM | POA: Diagnosis not present

## 2023-07-13 ENCOUNTER — Other Ambulatory Visit: Payer: Self-pay | Admitting: Hematology & Oncology

## 2023-08-11 ENCOUNTER — Other Ambulatory Visit: Payer: Self-pay | Admitting: Orthopedic Surgery

## 2023-09-24 ENCOUNTER — Inpatient Hospital Stay: Payer: Federal, State, Local not specified - PPO | Admitting: Hematology & Oncology

## 2023-09-24 ENCOUNTER — Inpatient Hospital Stay: Payer: Federal, State, Local not specified - PPO | Attending: Internal Medicine

## 2023-09-26 ENCOUNTER — Other Ambulatory Visit: Payer: Federal, State, Local not specified - PPO

## 2023-09-26 ENCOUNTER — Ambulatory Visit: Payer: Federal, State, Local not specified - PPO | Admitting: Hematology & Oncology

## 2023-09-29 DIAGNOSIS — M542 Cervicalgia: Secondary | ICD-10-CM | POA: Diagnosis not present

## 2023-09-29 DIAGNOSIS — M545 Low back pain, unspecified: Secondary | ICD-10-CM | POA: Diagnosis not present

## 2023-10-21 ENCOUNTER — Inpatient Hospital Stay: Payer: Federal, State, Local not specified - PPO | Attending: Hematology & Oncology

## 2023-10-21 ENCOUNTER — Inpatient Hospital Stay: Payer: Federal, State, Local not specified - PPO | Admitting: Hematology & Oncology

## 2023-10-21 ENCOUNTER — Encounter: Payer: Self-pay | Admitting: Hematology & Oncology

## 2023-10-21 VITALS — HR 85 | Temp 98.1°F | Resp 17 | Wt 149.0 lb

## 2023-10-21 DIAGNOSIS — Z86718 Personal history of other venous thrombosis and embolism: Secondary | ICD-10-CM | POA: Insufficient documentation

## 2023-10-21 DIAGNOSIS — C50011 Malignant neoplasm of nipple and areola, right female breast: Secondary | ICD-10-CM

## 2023-10-21 DIAGNOSIS — Z79899 Other long term (current) drug therapy: Secondary | ICD-10-CM | POA: Insufficient documentation

## 2023-10-21 DIAGNOSIS — Z7982 Long term (current) use of aspirin: Secondary | ICD-10-CM | POA: Insufficient documentation

## 2023-10-21 DIAGNOSIS — Z853 Personal history of malignant neoplasm of breast: Secondary | ICD-10-CM | POA: Diagnosis not present

## 2023-10-21 LAB — CMP (CANCER CENTER ONLY)
ALT: 12 U/L (ref 0–44)
AST: 20 U/L (ref 15–41)
Albumin: 4.2 g/dL (ref 3.5–5.0)
Alkaline Phosphatase: 55 U/L (ref 38–126)
Anion gap: 8 (ref 5–15)
BUN: 17 mg/dL (ref 8–23)
CO2: 29 mmol/L (ref 22–32)
Calcium: 10.4 mg/dL — ABNORMAL HIGH (ref 8.9–10.3)
Chloride: 104 mmol/L (ref 98–111)
Creatinine: 0.72 mg/dL (ref 0.44–1.00)
GFR, Estimated: >60 mL/min (ref >60)
Glucose, Bld: 107 mg/dL — ABNORMAL HIGH (ref 70–99)
Potassium: 3.9 mmol/L (ref 3.5–5.1)
Sodium: 141 mmol/L (ref 135–145)
Total Bilirubin: 0.7 mg/dL (ref <1.2)
Total Protein: 7.2 g/dL (ref 6.5–8.1)

## 2023-10-21 LAB — CBC WITH DIFFERENTIAL (CANCER CENTER ONLY)
Abs Immature Granulocytes: 0.01 10*3/uL (ref 0.00–0.07)
Basophils Absolute: 0.1 10*3/uL (ref 0.0–0.1)
Basophils Relative: 1 %
Eosinophils Absolute: 0.1 10*3/uL (ref 0.0–0.5)
Eosinophils Relative: 1 %
HCT: 38.7 % (ref 36.0–46.0)
Hemoglobin: 12.6 g/dL (ref 12.0–15.0)
Immature Granulocytes: 0 %
Lymphocytes Relative: 28 %
Lymphs Abs: 1.5 10*3/uL (ref 0.7–4.0)
MCH: 30.8 pg (ref 26.0–34.0)
MCHC: 32.6 g/dL (ref 30.0–36.0)
MCV: 94.6 fL (ref 80.0–100.0)
Monocytes Absolute: 0.5 10*3/uL (ref 0.1–1.0)
Monocytes Relative: 9 %
Neutro Abs: 3.3 10*3/uL (ref 1.7–7.7)
Neutrophils Relative %: 61 %
Platelet Count: 302 10*3/uL (ref 150–400)
RBC: 4.09 MIL/uL (ref 3.87–5.11)
RDW: 13.1 % (ref 11.5–15.5)
WBC Count: 5.4 10*3/uL (ref 4.0–10.5)
nRBC: 0 % (ref 0.0–0.2)

## 2023-10-21 LAB — LACTATE DEHYDROGENASE: LDH: 165 U/L (ref 98–192)

## 2023-10-21 NOTE — Progress Notes (Signed)
Hematology and Oncology Follow Up Visit  Maureen Duncan [id number] 1951-03-28 72 y.o. 10/21/2023   Principle Diagnosis:  Stage IIb (T2N1M0) ductal carcinoma of the right breast-ER negative/HER-2 positive History of DVT of the left subclavian vein  Current Therapy:   Aspirin 162 mg by mouth daily     Interim History:  Ms.  Duncan is back for followup.  We see her every 6 months.  She is doing quite nicely.  She is getting ready for Christmas.  She had a nice Thanksgiving.  She had no problems since we last saw her.  She has had no problems with bowels or bladder.  She has had no cough or shortness of breath.  She has had no problems with COVID.  She is taking aspirin.  There is been no issues with the aspirin.  She has had no dyspepsia.  She has had no leg swelling.  She has had maybe a little lymphedema in the right arm but this seems to be well control.  She has had no headache.  There is been no bleeding.  Overall, I would say that her performance status is probably ECOG 1.      Medications:  Current Outpatient Medications:    acetaminophen (TYLENOL) 500 MG tablet, Take 2 tablets (1,000 mg total) by mouth every 6 (six) hours., Disp: 30 tablet, Rfl: 0   amLODipine-benazepril (LOTREL) 5-20 MG capsule, TAKE 1 CAPSULE BY MOUTH EVERY DAY, Disp: 90 capsule, Rfl: 1   amoxicillin (AMOXIL) 500 MG capsule, TAKE 2G (4 CAPSULES) 1 HOUR PRIOR TO DENTAL PROCEDURE, Disp: 10 capsule, Rfl: 0   atorvastatin (LIPITOR) 40 MG tablet, Take 40 mg by mouth daily., Disp: , Rfl:    diphenhydrAMINE (BENADRYL) 25 MG tablet, Take 50 mg by mouth daily as needed for itching or allergies., Disp: , Rfl:    diphenhydramine-acetaminophen (TYLENOL PM) 25-500 MG TABS, Take 1 tablet by mouth at bedtime as needed (sleep)., Disp: , Rfl:    fluticasone (FLONASE) 50 MCG/ACT nasal spray, SPRAY 2 SPRAYS INTO EACH NOSTRIL EVERY DAY AS NEEDED, Disp: 16 mL, Rfl: 3   magnesium citrate SOLN, Take 1 Bottle by mouth every 30  (thirty) days., Disp: , Rfl:    triamcinolone cream (KENALOG) 0.1 %, Apply 1 application topically daily as needed (rash). , Disp: , Rfl:    Vitamin D, Ergocalciferol, (DRISDOL) 1.25 MG (50000 UNIT) CAPS capsule, TAKE 1 CAPSULE BY MOUTH 2 TIMES A WEEK, Disp: 24 capsule, Rfl: 3 No current facility-administered medications for this visit.  Facility-Administered Medications Ordered in Other Visits:    fentaNYL (SUBLIMAZE) injection, , , Anesthesia Intra-op, Lovie Chol, CRNA, 50 mcg at 02/24/18 9563   lactated ringers infusion, , , Continuous PRN, Lovie Chol, CRNA, [place] Bag at 04/07/18 8756   midazolam (VERSED) 5 MG/5ML injection, , , Anesthesia Intra-op, Lovie Chol, CRNA, 2 mg at 02/24/18 0703   sodium chloride 0.9 % injection 10 mL, 10 mL, Intravenous, PRN, Josph Macho, MD, 10 mL at 06/17/12 1651  Allergies:  Allergies  Allergen Reactions   Meloxicam Other (See Comments)    Pt states "gives me a weird feeling"  "makes my heart race and makes me anxious"   Other reaction(s): Tachycardia   Adhesive [Tape] Rash    Past Medical History, Surgical history, Social history, and Family History were reviewed and updated.  Review of Systems: Review of Systems  Constitutional: Negative.   HENT: Negative.    Eyes: Negative.   Respiratory: Negative.  Cardiovascular: Negative.   Gastrointestinal: Negative.   Genitourinary: Negative.   Musculoskeletal: Negative.   Skin: Negative.   Neurological: Negative.   Endo/Heme/Allergies: Negative.   Psychiatric/Behavioral: Negative.      Physical Exam:  weight is 149 lb (67.6 kg). Her oral temperature is 98.1 F (36.7 C). Her pulse is 85. Her respiration is 17 and oxygen saturation is 99%.   Physical Exam Vitals reviewed.  Constitutional:      Comments: Breast exam shows left breast with no masses, edema or erythema.  There is no left nipple discharge.  There is no left axillary adenopathy.  Right chest wall shows  well-healed mastectomy.  There is no right chest wall nodules.  There is no right chest wall erythema.  There is no right axillary adenopathy.  HENT:     Head: Normocephalic and atraumatic.  Eyes:     Pupils: Pupils are equal, round, and reactive to light.  Cardiovascular:     Rate and Rhythm: Normal rate and regular rhythm.     Heart sounds: Normal heart sounds.  Pulmonary:     Effort: Pulmonary effort is normal.     Breath sounds: Normal breath sounds.  Abdominal:     General: Bowel sounds are normal.     Palpations: Abdomen is soft.  Musculoskeletal:        General: No tenderness or deformity. Normal range of motion.     Cervical back: Normal range of motion.  Lymphadenopathy:     Cervical: No cervical adenopathy.  Skin:    General: Skin is warm and dry.     Findings: No erythema or rash.  Neurological:     Mental Status: She is alert and oriented to person, place, and time.  Psychiatric:        Behavior: Behavior normal.        Thought Content: Thought content normal.        Judgment: Judgment normal.      Lab Results  Component Value Date   WBC 5.4 10/21/2023   HGB 12.6 10/21/2023   HCT 38.7 10/21/2023   MCV 94.6 10/21/2023   PLT 302 10/21/2023     Chemistry      Component Value Date/Time   NA 141 10/21/2023 0843   NA 141 07/28/2017 0911   NA 142 01/27/2017 0849   K 3.9 10/21/2023 0843   K 3.8 07/28/2017 0911   K 4.3 01/27/2017 0849   CL 104 10/21/2023 0843   CL 105 07/28/2017 0911   CO2 29 10/21/2023 0843   CO2 29 07/28/2017 0911   CO2 29 01/27/2017 0849   BUN 17 10/21/2023 0843   BUN 13 07/28/2017 0911   BUN 16.9 01/27/2017 0849   CREATININE 0.72 10/21/2023 0843   CREATININE 0.8 07/28/2017 0911   CREATININE 0.8 01/27/2017 0849      Component Value Date/Time   CALCIUM 10.4 (H) 10/21/2023 0843   CALCIUM 10.0 07/28/2017 0911   CALCIUM 10.5 (H) 01/27/2017 0849   ALKPHOS 55 10/21/2023 0843   ALKPHOS 64 07/28/2017 0911   ALKPHOS 67 01/27/2017 0849    AST 20 10/21/2023 0843   AST 20 01/27/2017 0849   ALT 12 10/21/2023 0843   ALT 19 07/28/2017 0911   ALT 16 01/27/2017 0849   BILITOT 0.7 10/21/2023 0843   BILITOT 0.53 01/27/2017 0849      Impression and Plan: Maureen Duncan is 72 year old African female. She has a history of stage IIb ductal carcinoma of the right breast. Her  tumor was HER-2 positive. She was treated with systemic chemotherapy followed by a year of Herceptin. She completed this in August of 2014.  Of note, she had 3 positive lymph nodes. She did receive 6 cycles of chemotherapy with Taxotere and carboplatinum.  I forgot to mention that her grandson has a job.  He works for [employer].  He really enjoys this.  She is quite happy for him.  We will go ahead and plan to get her back to see [place] in another 6 months.   Josph Macho, MD 12/24/20249:41 AM

## 2023-10-27 ENCOUNTER — Other Ambulatory Visit: Payer: Self-pay | Admitting: Hematology & Oncology

## 2023-12-02 ENCOUNTER — Other Ambulatory Visit: Payer: Self-pay | Admitting: Hematology & Oncology

## 2023-12-02 DIAGNOSIS — E559 Vitamin D deficiency, unspecified: Secondary | ICD-10-CM

## 2023-12-27 ENCOUNTER — Other Ambulatory Visit: Payer: Self-pay | Admitting: Surgical

## 2023-12-30 LAB — HM MAMMOGRAPHY

## 2024-01-02 ENCOUNTER — Encounter: Payer: Self-pay | Admitting: Hematology & Oncology

## 2024-04-16 ENCOUNTER — Encounter: Payer: Self-pay | Admitting: Hematology & Oncology

## 2024-04-16 ENCOUNTER — Inpatient Hospital Stay: Payer: Federal, State, Local not specified - PPO | Attending: Hematology & Oncology

## 2024-04-16 ENCOUNTER — Other Ambulatory Visit: Payer: Self-pay

## 2024-04-16 ENCOUNTER — Inpatient Hospital Stay (HOSPITAL_BASED_OUTPATIENT_CLINIC_OR_DEPARTMENT_OTHER): Payer: Federal, State, Local not specified - PPO | Admitting: Hematology & Oncology

## 2024-04-16 VITALS — BP 124/85 | HR 73 | Temp 98.1°F | Resp 18 | Ht 65.0 in | Wt 149.0 lb

## 2024-04-16 DIAGNOSIS — Z86718 Personal history of other venous thrombosis and embolism: Secondary | ICD-10-CM | POA: Insufficient documentation

## 2024-04-16 DIAGNOSIS — C50011 Malignant neoplasm of nipple and areola, right female breast: Secondary | ICD-10-CM | POA: Diagnosis not present

## 2024-04-16 DIAGNOSIS — M1710 Unilateral primary osteoarthritis, unspecified knee: Secondary | ICD-10-CM

## 2024-04-16 DIAGNOSIS — Z853 Personal history of malignant neoplasm of breast: Secondary | ICD-10-CM | POA: Insufficient documentation

## 2024-04-16 DIAGNOSIS — Z7982 Long term (current) use of aspirin: Secondary | ICD-10-CM | POA: Diagnosis not present

## 2024-04-16 DIAGNOSIS — T07XXXA Unspecified multiple injuries, initial encounter: Secondary | ICD-10-CM

## 2024-04-16 LAB — CBC WITH DIFFERENTIAL (CANCER CENTER ONLY)
Abs Immature Granulocytes: 0.02 10*3/uL (ref 0.00–0.07)
Basophils Absolute: 0 10*3/uL (ref 0.0–0.1)
Basophils Relative: 1 %
Eosinophils Absolute: 0.1 10*3/uL (ref 0.0–0.5)
Eosinophils Relative: 2 %
HCT: 39.4 % (ref 36.0–46.0)
Hemoglobin: 13.1 g/dL (ref 12.0–15.0)
Immature Granulocytes: 0 %
Lymphocytes Relative: 28 %
Lymphs Abs: 1.5 10*3/uL (ref 0.7–4.0)
MCH: 31.3 pg (ref 26.0–34.0)
MCHC: 33.2 g/dL (ref 30.0–36.0)
MCV: 94.3 fL (ref 80.0–100.0)
Monocytes Absolute: 0.4 10*3/uL (ref 0.1–1.0)
Monocytes Relative: 7 %
Neutro Abs: 3.3 10*3/uL (ref 1.7–7.7)
Neutrophils Relative %: 62 %
Platelet Count: 262 10*3/uL (ref 150–400)
RBC: 4.18 MIL/uL (ref 3.87–5.11)
RDW: 12.8 % (ref 11.5–15.5)
WBC Count: 5.4 10*3/uL (ref 4.0–10.5)
nRBC: 0 % (ref 0.0–0.2)

## 2024-04-16 LAB — CMP (CANCER CENTER ONLY)
ALT: 14 U/L (ref 0–44)
AST: 32 U/L (ref 15–41)
Albumin: 4.4 g/dL (ref 3.5–5.0)
Alkaline Phosphatase: 54 U/L (ref 38–126)
Anion gap: 9 (ref 5–15)
BUN: 17 mg/dL (ref 8–23)
CO2: 25 mmol/L (ref 22–32)
Calcium: 10.3 mg/dL (ref 8.9–10.3)
Chloride: 104 mmol/L (ref 98–111)
Creatinine: 0.77 mg/dL (ref 0.44–1.00)
GFR, Estimated: >60 mL/min (ref >60)
Glucose, Bld: 106 mg/dL — ABNORMAL HIGH (ref 70–99)
Potassium: 3.8 mmol/L (ref 3.5–5.1)
Sodium: 138 mmol/L (ref 135–145)
Total Bilirubin: 0.7 mg/dL (ref 0.0–1.2)
Total Protein: 7.3 g/dL (ref 6.5–8.1)

## 2024-04-16 NOTE — Progress Notes (Signed)
 Hematology and Oncology Follow Up Visit  Maureen Duncan [id number] 08-Dec-1950 73 y.o. 04/16/2024   Principle Diagnosis:  Stage IIb (T2N1M0) ductal carcinoma of the right breast-ER negative/HER-2 positive History of DVT of the left subclavian vein  Current Therapy:   Aspirin  162 mg by mouth daily     Interim History:  Ms.  Duncan is back for followup.  We see her every 6 months.  Her grandson is up with her right now.  He is working.  He is taking some time off before he starts to go to college.  I think this is a good idea..  She feels good.  She does have a little bit of stress.  She is worried about the world problem right now.  She has had no problems with nausea or vomiting.  She has had no problems with pain.  Is been no change in bowel or bladder habits.  She has had no issues with cough or shortness of breath.  She has had no leg swelling.  She has had little bit of lymphedema in the right arm.  She is probably going to go back to wearing a sleeve.  Patient continues on her aspirin .  She has had no problems with dyspepsia.  She is doing quite nicely.  She is getting ready for Christmas.  She had a nice Thanksgiving.  She had no problems since we last saw her.  She has had no problems with bowels or bladder.  She has had no cough or shortness of breath.  She has had no problems with COVID.  She is taking aspirin .  There is been no issues with the aspirin .  She has had no dyspepsia.  She had a mammogram that was done on 12/30/2023.  It was noted fine with the left breast.  Overall, I would say that her performance status is probably ECOG 1.      Medications:  Current Outpatient Medications:    acetaminophen  (TYLENOL ) 500 MG tablet, Take 2 tablets (1,000 mg total) by mouth every 6 (six) hours., Disp: 30 tablet, Rfl: 0   amLODipine -benazepril  (LOTREL) 5-20 MG capsule, TAKE 1 CAPSULE BY MOUTH EVERY DAY, Disp: 90 capsule, Rfl: 1   amoxicillin  (AMOXIL ) 500 MG capsule, TAKE 4 CAPSULES  BY MOUTH 1 HOUR PRIOR TO DENTAL PROCEDURE, Disp: 8 capsule, Rfl: 1   atorvastatin  (LIPITOR) 40 MG tablet, Take 40 mg by mouth daily., Disp: , Rfl:    diphenhydrAMINE  (BENADRYL ) 25 MG tablet, Take 50 mg by mouth daily as needed for itching or allergies., Disp: , Rfl:    diphenhydramine -acetaminophen  (TYLENOL  PM) 25-500 MG TABS, Take 1 tablet by mouth at bedtime as needed (sleep)., Disp: , Rfl:    fluticasone  (FLONASE ) 50 MCG/ACT nasal spray, SPRAY 2 SPRAYS INTO EACH NOSTRIL EVERY DAY AS NEEDED, Disp: 16 mL, Rfl: 3   magnesium citrate SOLN, Take 1 Bottle by mouth every 30 (thirty) days., Disp: , Rfl:    triamcinolone cream (KENALOG) 0.1 %, Apply 1 application topically daily as needed (rash). , Disp: , Rfl:    Vitamin D , Ergocalciferol , (DRISDOL ) 1.25 MG (50000 UNIT) CAPS capsule, TAKE 1 CAPSULE BY MOUTH 2 TIMES A WEEK, Disp: 24 capsule, Rfl: 3 No current facility-administered medications for this visit.  Facility-Administered Medications Ordered in Other Visits:    fentaNYL  (SUBLIMAZE ) injection, , , Anesthesia Intra-op, Donivan Furry, CRNA, 50 mcg at 02/24/18 2952   lactated ringers  infusion, , , Continuous PRN, Donivan Furry, CRNA, [place] Bag at 04/07/18  1610   midazolam  (VERSED ) 5 MG/5ML injection, , , Anesthesia Intra-op, Donivan Furry, CRNA, 2 mg at 02/24/18 0703   sodium chloride  0.9 % injection 10 mL, 10 mL, Intravenous, PRN, Jader Desai R, MD, 10 mL at 06/17/12 1651  Allergies:  Allergies  Allergen Reactions   Meloxicam  Other (See Comments)    Pt states gives me a weird feeling  makes my heart race and makes me anxious   Other reaction(s): Tachycardia   Adhesive [Tape] Rash    Past Medical History, Surgical history, Social history, and Family History were reviewed and updated.  Review of Systems: Review of Systems  Constitutional: Negative.   HENT: Negative.    Eyes: Negative.   Respiratory: Negative.    Cardiovascular: Negative.   Gastrointestinal: Negative.    Genitourinary: Negative.   Musculoskeletal: Negative.   Skin: Negative.   Neurological: Negative.   Endo/Heme/Allergies: Negative.   Psychiatric/Behavioral: Negative.      Physical Exam:  height is 5' 5 (1.651 m) and weight is 149 lb (67.6 kg). Her oral temperature is 98.1 F (36.7 C). Her blood pressure is 124/85 and her pulse is 73. Her respiration is 18 and oxygen saturation is 98%.   Physical Exam Vitals reviewed.  Constitutional:      Comments: Breast exam shows left breast with no masses, edema or erythema.  There is no left nipple discharge.  There is no left axillary adenopathy.  Right chest wall shows well-healed mastectomy.  There is no right chest wall nodules.  There is no right chest wall erythema.  There is no right axillary adenopathy.  HENT:     Head: Normocephalic and atraumatic.   Eyes:     Pupils: Pupils are equal, round, and reactive to light.    Cardiovascular:     Rate and Rhythm: Normal rate and regular rhythm.     Heart sounds: Normal heart sounds.  Pulmonary:     Effort: Pulmonary effort is normal.     Breath sounds: Normal breath sounds.  Abdominal:     General: Bowel sounds are normal.     Palpations: Abdomen is soft.   Musculoskeletal:        General: No tenderness or deformity. Normal range of motion.     Cervical back: Normal range of motion.  Lymphadenopathy:     Cervical: No cervical adenopathy.   Skin:    General: Skin is warm and dry.     Findings: No erythema or rash.   Neurological:     Mental Status: She is alert and oriented to person, place, and time.   Psychiatric:        Behavior: Behavior normal.        Thought Content: Thought content normal.        Judgment: Judgment normal.      Lab Results  Component Value Date   WBC 5.4 04/16/2024   HGB 13.1 04/16/2024   HCT 39.4 04/16/2024   MCV 94.3 04/16/2024   PLT 262 04/16/2024     Chemistry      Component Value Date/Time   NA 138 04/16/2024 0835   NA 141  07/28/2017 0911   NA 142 01/27/2017 0849   K 3.8 04/16/2024 0835   K 3.8 07/28/2017 0911   K 4.3 01/27/2017 0849   CL 104 04/16/2024 0835   CL 105 07/28/2017 0911   CO2 25 04/16/2024 0835   CO2 29 07/28/2017 0911   CO2 29 01/27/2017 0849   BUN 17  04/16/2024 0835   BUN 13 07/28/2017 0911   BUN 16.9 01/27/2017 0849   CREATININE 0.77 04/16/2024 0835   CREATININE 0.8 07/28/2017 0911   CREATININE 0.8 01/27/2017 0849      Component Value Date/Time   CALCIUM  10.3 04/16/2024 0835   CALCIUM  10.0 07/28/2017 0911   CALCIUM  10.5 (H) 01/27/2017 0849   ALKPHOS 54 04/16/2024 0835   ALKPHOS 64 07/28/2017 0911   ALKPHOS 67 01/27/2017 0849   AST 32 04/16/2024 0835   AST 20 01/27/2017 0849   ALT 14 04/16/2024 0835   ALT 19 07/28/2017 0911   ALT 16 01/27/2017 0849   BILITOT 0.7 04/16/2024 0835   BILITOT 0.53 01/27/2017 0849      Impression and Plan: Ms. Artiga is 73 year old African female. She has a history of stage IIb ductal carcinoma of the right breast. Her tumor was HER-2 positive. She was treated with systemic chemotherapy followed by a year of Herceptin . She completed this in August of 2014.  Of note, she had 3 positive lymph nodes. She did receive 6 cycles of chemotherapy with Taxotere  and carboplatinum.  I am glad that her grandson is doing okay.  I know that she does worry about him.  I do not see any evidence of recurrent breast cancer.  I think that she has very good chance of being cured.  We will still plan to get her back in 6 months.   Ivor Mars, MD 6/20/20259:10 AM

## 2024-06-18 ENCOUNTER — Other Ambulatory Visit: Payer: Self-pay | Admitting: Hematology & Oncology

## 2024-08-30 ENCOUNTER — Encounter: Payer: Self-pay | Admitting: Radiology

## 2024-10-15 ENCOUNTER — Ambulatory Visit: Payer: Self-pay | Admitting: Hematology & Oncology

## 2024-10-15 ENCOUNTER — Encounter: Payer: Self-pay | Admitting: Hematology & Oncology

## 2024-10-15 ENCOUNTER — Other Ambulatory Visit: Payer: Self-pay

## 2024-10-15 ENCOUNTER — Inpatient Hospital Stay: Attending: Hematology & Oncology

## 2024-10-15 ENCOUNTER — Ambulatory Visit: Admitting: Hematology & Oncology

## 2024-10-15 ENCOUNTER — Encounter: Payer: Self-pay | Admitting: *Deleted

## 2024-10-15 VITALS — BP 121/82 | HR 83 | Resp 18 | Ht 65.0 in | Wt 148.0 lb

## 2024-10-15 DIAGNOSIS — Z853 Personal history of malignant neoplasm of breast: Secondary | ICD-10-CM | POA: Diagnosis present

## 2024-10-15 DIAGNOSIS — M1710 Unilateral primary osteoarthritis, unspecified knee: Secondary | ICD-10-CM

## 2024-10-15 DIAGNOSIS — Z86718 Personal history of other venous thrombosis and embolism: Secondary | ICD-10-CM | POA: Insufficient documentation

## 2024-10-15 DIAGNOSIS — Z08 Encounter for follow-up examination after completed treatment for malignant neoplasm: Secondary | ICD-10-CM | POA: Diagnosis present

## 2024-10-15 DIAGNOSIS — C50011 Malignant neoplasm of nipple and areola, right female breast: Secondary | ICD-10-CM

## 2024-10-15 DIAGNOSIS — Z7982 Long term (current) use of aspirin: Secondary | ICD-10-CM | POA: Diagnosis not present

## 2024-10-15 DIAGNOSIS — Z79899 Other long term (current) drug therapy: Secondary | ICD-10-CM | POA: Diagnosis not present

## 2024-10-15 DIAGNOSIS — T07XXXA Unspecified multiple injuries, initial encounter: Secondary | ICD-10-CM

## 2024-10-15 LAB — CBC WITH DIFFERENTIAL (CANCER CENTER ONLY)
Abs Immature Granulocytes: 0.03 K/uL (ref 0.00–0.07)
Basophils Absolute: 0 K/uL (ref 0.0–0.1)
Basophils Relative: 0 %
Eosinophils Absolute: 0.1 K/uL (ref 0.0–0.5)
Eosinophils Relative: 1 %
HCT: 37.8 % (ref 36.0–46.0)
Hemoglobin: 12.2 g/dL (ref 12.0–15.0)
Immature Granulocytes: 0 %
Lymphocytes Relative: 16 %
Lymphs Abs: 1.4 K/uL (ref 0.7–4.0)
MCH: 31.3 pg (ref 26.0–34.0)
MCHC: 32.3 g/dL (ref 30.0–36.0)
MCV: 96.9 fL (ref 80.0–100.0)
Monocytes Absolute: 0.7 K/uL (ref 0.1–1.0)
Monocytes Relative: 8 %
Neutro Abs: 6.6 K/uL (ref 1.7–7.7)
Neutrophils Relative %: 75 %
Platelet Count: 255 K/uL (ref 150–400)
RBC: 3.9 MIL/uL (ref 3.87–5.11)
RDW: 12.8 % (ref 11.5–15.5)
WBC Count: 8.8 K/uL (ref 4.0–10.5)
nRBC: 0 % (ref 0.0–0.2)

## 2024-10-15 LAB — CMP (CANCER CENTER ONLY)
ALT: 14 U/L (ref 0–44)
AST: 19 U/L (ref 15–41)
Albumin: 4.1 g/dL (ref 3.5–5.0)
Alkaline Phosphatase: 58 U/L (ref 38–126)
Anion gap: 11 (ref 5–15)
BUN: 10 mg/dL (ref 8–23)
CO2: 27 mmol/L (ref 22–32)
Calcium: 10.5 mg/dL — ABNORMAL HIGH (ref 8.9–10.3)
Chloride: 102 mmol/L (ref 98–111)
Creatinine: 0.65 mg/dL (ref 0.44–1.00)
GFR, Estimated: >60 mL/min
Glucose, Bld: 94 mg/dL (ref 70–99)
Potassium: 3.7 mmol/L (ref 3.5–5.1)
Sodium: 139 mmol/L (ref 135–145)
Total Bilirubin: 0.6 mg/dL (ref 0.0–1.2)
Total Protein: 7.3 g/dL (ref 6.5–8.1)

## 2024-10-15 LAB — VITAMIN D 25 HYDROXY (VIT D DEFICIENCY, FRACTURES): Vit D, 25-Hydroxy: 89.4 ng/mL (ref 30–100)

## 2024-10-15 NOTE — Progress Notes (Signed)
 " Hematology and Oncology Follow Up Visit  Maureen Duncan [id number] 11-14-1950 73 y.o. 10/15/2024   Principle Diagnosis:  Stage IIb (T2N1M0) ductal carcinoma of the right breast-ER negative/HER-2 positive History of DVT of the left subclavian vein  Current Therapy:   Aspirin  162 mg by mouth daily     Interim History:  Ms.  Duncan is back for followup.  We see her every 6 months.  Since we last saw her, she had been doing quite well.  She is happy that her grandson is in college.  He is in college and also working.  He is on college break right now.  However, he is working pretty much full-time.  She is doing well.  She has had no complaints.  She has had no problems with nausea or vomiting.  There has been no bleeding.  She has had no leg swelling.  She has had no cough or shortness of breath.  Thankfully, there is been no problems with COVID.  She has had no change in bowel or bladder habits.  Of note, her last mammogram was back in March.  Everything looks fine.  Overall, I will say that her performance status is ECOG 0.    Medications:  Current Outpatient Medications:    acetaminophen  (TYLENOL ) 500 MG tablet, Take 2 tablets (1,000 mg total) by mouth every 6 (six) hours., Disp: 30 tablet, Rfl: 0   amLODipine -benazepril  (LOTREL) 5-20 MG capsule, TAKE 1 CAPSULE BY MOUTH EVERY DAY, Disp: 90 capsule, Rfl: 1   amoxicillin  (AMOXIL ) 500 MG capsule, TAKE 4 CAPSULES BY MOUTH 1 HOUR PRIOR TO DENTAL PROCEDURE, Disp: 8 capsule, Rfl: 1   diphenhydrAMINE  (BENADRYL ) 25 MG tablet, Take 50 mg by mouth daily as needed for itching or allergies., Disp: , Rfl:    diphenhydramine -acetaminophen  (TYLENOL  PM) 25-500 MG TABS, Take 1 tablet by mouth at bedtime as needed (sleep)., Disp: , Rfl:    fluticasone  (FLONASE ) 50 MCG/ACT nasal spray, SPRAY 2 SPRAYS INTO EACH NOSTRIL EVERY DAY AS NEEDED, Disp: 16 mL, Rfl: 3   magnesium citrate SOLN, Take 1 Bottle by mouth every 30 (thirty) days., Disp: , Rfl:     triamcinolone cream (KENALOG) 0.1 %, Apply 1 application topically daily as needed (rash). , Disp: , Rfl:  No current facility-administered medications for this visit.  Facility-Administered Medications Ordered in Other Visits:    fentaNYL  (SUBLIMAZE ) injection, , , Anesthesia Intra-op, Hanley Delon POUR, CRNA, 50 mcg at 02/24/18 9292   lactated ringers  infusion, , , Continuous PRN, Hanley Delon POUR, CRNA, [place] Bag at 04/07/18 9177   midazolam  (VERSED ) 5 MG/5ML injection, , , Anesthesia Intra-op, Hanley Delon POUR, CRNA, 2 mg at 02/24/18 0703   sodium chloride  0.9 % injection 10 mL, 10 mL, Intravenous, PRN, Chivonne Rascon R, MD, 10 mL at 06/17/12 1651  Allergies:  Allergies  Allergen Reactions   Meloxicam  Other (See Comments)    Pt states gives me a weird feeling  makes my heart race and makes me anxious   Other reaction(s): Tachycardia   Adhesive [Tape] Rash    Past Medical History, Surgical history, Social history, and Family History were reviewed and updated.  Review of Systems: Review of Systems  Constitutional: Negative.   HENT: Negative.    Eyes: Negative.   Respiratory: Negative.    Cardiovascular: Negative.   Gastrointestinal: Negative.   Genitourinary: Negative.   Musculoskeletal: Negative.   Skin: Negative.   Neurological: Negative.   Endo/Heme/Allergies: Negative.   Psychiatric/Behavioral: Negative.  Physical Exam:  Vital signs show temperature of 98.  Pulse 74.  Blood pressure is 121/82.  Weight is 148 pounds.  Physical Exam Vitals reviewed.  Constitutional:      Comments: Breast exam shows left breast with no masses, edema or erythema.  There is no left nipple discharge.  There is no left axillary adenopathy.  Right chest wall shows well-healed mastectomy.  There is no right chest wall nodules.  There is no right chest wall erythema.  There is no right axillary adenopathy.  HENT:     Head: Normocephalic and atraumatic.  Eyes:     Pupils: Pupils are  equal, round, and reactive to light.  Cardiovascular:     Rate and Rhythm: Normal rate and regular rhythm.     Heart sounds: Normal heart sounds.  Pulmonary:     Effort: Pulmonary effort is normal.     Breath sounds: Normal breath sounds.  Abdominal:     General: Bowel sounds are normal.     Palpations: Abdomen is soft.  Musculoskeletal:        General: No tenderness or deformity. Normal range of motion.     Cervical back: Normal range of motion.  Lymphadenopathy:     Cervical: No cervical adenopathy.  Skin:    General: Skin is warm and dry.     Findings: No erythema or rash.  Neurological:     Mental Status: She is alert and oriented to person, place, and time.  Psychiatric:        Behavior: Behavior normal.        Thought Content: Thought content normal.        Judgment: Judgment normal.      Lab Results  Component Value Date   WBC PENDING 10/15/2024   HGB 12.2 10/15/2024   HCT 37.8 10/15/2024   MCV 96.9 10/15/2024   PLT 255 10/15/2024     Chemistry      Component Value Date/Time   NA 138 04/16/2024 0835   NA 141 07/28/2017 0911   NA 142 01/27/2017 0849   K 3.8 04/16/2024 0835   K 3.8 07/28/2017 0911   K 4.3 01/27/2017 0849   CL 104 04/16/2024 0835   CL 105 07/28/2017 0911   CO2 25 04/16/2024 0835   CO2 29 07/28/2017 0911   CO2 29 01/27/2017 0849   BUN 17 04/16/2024 0835   BUN 13 07/28/2017 0911   BUN 16.9 01/27/2017 0849   CREATININE 0.77 04/16/2024 0835   CREATININE 0.8 07/28/2017 0911   CREATININE 0.8 01/27/2017 0849      Component Value Date/Time   CALCIUM  10.3 04/16/2024 0835   CALCIUM  10.0 07/28/2017 0911   CALCIUM  10.5 (H) 01/27/2017 0849   ALKPHOS 54 04/16/2024 0835   ALKPHOS 64 07/28/2017 0911   ALKPHOS 67 01/27/2017 0849   AST 32 04/16/2024 0835   AST 20 01/27/2017 0849   ALT 14 04/16/2024 0835   ALT 19 07/28/2017 0911   ALT 16 01/27/2017 0849   BILITOT 0.7 04/16/2024 0835   BILITOT 0.53 01/27/2017 0849      Impression and  Plan: Maureen Duncan is 73 year old African female. She has a history of stage IIb ductal carcinoma of the right breast. Her tumor was HER-2 positive. She was treated with systemic chemotherapy followed by a year of Herceptin . She completed this in August of 2014.  Of note, she had 3 positive lymph nodes. She did receive 6 cycles of chemotherapy with Taxotere  and carboplatinum.  For  right now, everything still looks fantastic.  I know that she does have that risk of recurrence because her tumor is HER2 positive.  I do not see any need for scans right now.  As always, we will follow her up in 6 months.   Maude JONELLE Crease, MD 12/19/20259:07 AM  "

## 2024-10-16 LAB — CANCER ANTIGEN 27.29: CA 27.29: 18.1 U/mL (ref 0.0–38.6)

## 2025-04-15 ENCOUNTER — Inpatient Hospital Stay

## 2025-04-15 ENCOUNTER — Inpatient Hospital Stay: Admitting: Hematology & Oncology
